# Patient Record
Sex: Female | Born: 1945
Health system: Southern US, Community
[De-identification: ages and names within clinical notes are randomized; demographics above are authoritative.]

## PROBLEM LIST (undated history)

## (undated) DIAGNOSIS — M1990 Unspecified osteoarthritis, unspecified site: Secondary | ICD-10-CM

## (undated) DIAGNOSIS — F419 Anxiety disorder, unspecified: Secondary | ICD-10-CM

## (undated) DIAGNOSIS — F32A Depression, unspecified: Secondary | ICD-10-CM

## (undated) DIAGNOSIS — K219 Gastro-esophageal reflux disease without esophagitis: Secondary | ICD-10-CM

## (undated) DIAGNOSIS — Z923 Personal history of irradiation: Secondary | ICD-10-CM

## (undated) DIAGNOSIS — E785 Hyperlipidemia, unspecified: Secondary | ICD-10-CM

## (undated) DIAGNOSIS — H532 Diplopia: Secondary | ICD-10-CM

## (undated) DIAGNOSIS — I499 Cardiac arrhythmia, unspecified: Secondary | ICD-10-CM

## (undated) DIAGNOSIS — R079 Chest pain, unspecified: Secondary | ICD-10-CM

## (undated) DIAGNOSIS — R0602 Shortness of breath: Secondary | ICD-10-CM

## (undated) DIAGNOSIS — R002 Palpitations: Secondary | ICD-10-CM

## (undated) HISTORY — DX: Shortness of breath: R06.02

## (undated) HISTORY — DX: Chest pain, unspecified: R07.9

## (undated) HISTORY — DX: Depression, unspecified: F32.A

## (undated) HISTORY — DX: Gastro-esophageal reflux disease without esophagitis: K21.9

## (undated) HISTORY — DX: Palpitations: R00.2

## (undated) HISTORY — DX: Diplopia: H53.2

## (undated) HISTORY — DX: Anxiety disorder, unspecified: F41.9

## (undated) HISTORY — PX: ABDOMINAL HYSTERECTOMY: SHX81

## (undated) HISTORY — DX: Unspecified osteoarthritis, unspecified site: M19.90

## (undated) HISTORY — DX: Hyperlipidemia, unspecified: E78.5

## (undated) HISTORY — DX: Cardiac arrhythmia, unspecified: I49.9

---
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
## 1996-09-10 HISTORY — PX: ABDOMINAL HYSTERECTOMY: SHX81

## 1999-12-13 ENCOUNTER — Other Ambulatory Visit: Admission: RE | Admit: 1999-12-13 | Discharge: 1999-12-13 | Payer: Self-pay | Admitting: Radiology

## 2000-02-06 ENCOUNTER — Other Ambulatory Visit: Admission: RE | Admit: 2000-02-06 | Discharge: 2000-02-06 | Payer: Self-pay | Admitting: Internal Medicine

## 2001-02-06 ENCOUNTER — Other Ambulatory Visit: Admission: RE | Admit: 2001-02-06 | Discharge: 2001-02-06 | Payer: Self-pay | Admitting: Internal Medicine

## 2002-09-23 ENCOUNTER — Ambulatory Visit (HOSPITAL_COMMUNITY): Admission: RE | Admit: 2002-09-23 | Discharge: 2002-09-23 | Payer: Self-pay | Admitting: Gastroenterology

## 2008-09-10 HISTORY — PX: WRIST SURGERY: SHX841

## 2012-01-25 ENCOUNTER — Emergency Department (HOSPITAL_COMMUNITY): Payer: Medicare Other

## 2012-01-25 ENCOUNTER — Encounter (HOSPITAL_COMMUNITY): Payer: Self-pay | Admitting: Emergency Medicine

## 2012-01-25 ENCOUNTER — Emergency Department (HOSPITAL_COMMUNITY)
Admission: EM | Admit: 2012-01-25 | Discharge: 2012-01-25 | Disposition: A | Discharge status: Home / Self Care | Payer: Medicare Other | Attending: Emergency Medicine | Admitting: Emergency Medicine

## 2012-01-25 DIAGNOSIS — X58XXXA Exposure to other specified factors, initial encounter: Secondary | ICD-10-CM | POA: Insufficient documentation

## 2012-01-25 DIAGNOSIS — M47812 Spondylosis without myelopathy or radiculopathy, cervical region: Secondary | ICD-10-CM | POA: Insufficient documentation

## 2012-01-25 DIAGNOSIS — S20219A Contusion of unspecified front wall of thorax, initial encounter: Secondary | ICD-10-CM

## 2012-01-25 DIAGNOSIS — S0990XA Unspecified injury of head, initial encounter: Secondary | ICD-10-CM | POA: Insufficient documentation

## 2012-01-25 DIAGNOSIS — R42 Dizziness and giddiness: Secondary | ICD-10-CM | POA: Insufficient documentation

## 2012-01-25 DIAGNOSIS — R55 Syncope and collapse: Secondary | ICD-10-CM

## 2012-01-25 DIAGNOSIS — R51 Headache: Secondary | ICD-10-CM | POA: Insufficient documentation

## 2012-01-25 DIAGNOSIS — S0180XA Unspecified open wound of other part of head, initial encounter: Secondary | ICD-10-CM | POA: Insufficient documentation

## 2012-01-25 DIAGNOSIS — S0181XA Laceration without foreign body of other part of head, initial encounter: Secondary | ICD-10-CM

## 2012-01-25 DIAGNOSIS — R110 Nausea: Secondary | ICD-10-CM | POA: Insufficient documentation

## 2012-01-25 DIAGNOSIS — S0010XA Contusion of unspecified eyelid and periocular area, initial encounter: Secondary | ICD-10-CM | POA: Insufficient documentation

## 2012-01-25 LAB — URINALYSIS, ROUTINE W REFLEX MICROSCOPIC
Bilirubin Urine: NEGATIVE
Glucose, UA: NEGATIVE mg/dL
Hgb urine dipstick: NEGATIVE
Ketones, ur: NEGATIVE mg/dL
Leukocytes, UA: NEGATIVE
Nitrite: NEGATIVE
Protein, ur: NEGATIVE mg/dL
Specific Gravity, Urine: 1.01 (ref 1.005–1.030)
Urobilinogen, UA: 0.2 mg/dL (ref 0.0–1.0)
pH: 7.5 (ref 5.0–8.0)

## 2012-01-25 LAB — CBC
HCT: 39.6 % (ref 36.0–46.0)
Hemoglobin: 13.2 g/dL (ref 12.0–15.0)
MCH: 31.5 pg (ref 26.0–34.0)
MCHC: 33.3 g/dL (ref 30.0–36.0)
MCV: 94.5 fL (ref 78.0–100.0)
Platelets: 259 10*3/uL (ref 150–400)
RBC: 4.19 MIL/uL (ref 3.87–5.11)
RDW: 13.1 % (ref 11.5–15.5)
WBC: 11.8 10*3/uL — ABNORMAL HIGH (ref 4.0–10.5)

## 2012-01-25 LAB — POCT I-STAT TROPONIN I: Troponin i, poc: 0 ng/mL (ref 0.00–0.08)

## 2012-01-25 LAB — COMPREHENSIVE METABOLIC PANEL
ALT: 19 U/L (ref 0–35)
AST: 19 U/L (ref 0–37)
Albumin: 4.1 g/dL (ref 3.5–5.2)
Alkaline Phosphatase: 81 U/L (ref 39–117)
BUN: 15 mg/dL (ref 6–23)
CO2: 25 mEq/L (ref 19–32)
Calcium: 9 mg/dL (ref 8.4–10.5)
Chloride: 106 mEq/L (ref 96–112)
Creatinine, Ser: 0.61 mg/dL (ref 0.50–1.10)
GFR calc Af Amer: >90 mL/min (ref >90)
GFR calc non Af Amer: >90 mL/min (ref >90)
Glucose, Bld: 114 mg/dL — ABNORMAL HIGH (ref 70–99)
Potassium: 3.7 mEq/L (ref 3.5–5.1)
Sodium: 143 mEq/L (ref 135–145)
Total Bilirubin: 0.5 mg/dL (ref 0.3–1.2)
Total Protein: 7.4 g/dL (ref 6.0–8.3)

## 2012-01-25 MED ORDER — ONDANSETRON 8 MG PO TBDP: 8.0000 mg | ORAL_TABLET | Freq: Three times a day (TID) | ORAL | Status: AC | PRN

## 2012-01-25 NOTE — ED Provider Notes (Signed)
History     CSN: [id number]  Arrival date & time 01/25/12  1710   First MD Initiated Contact with Patient 01/25/12 1801      Chief Complaint  Patient presents with  . Head Injury    (Consider location/radiation/quality/duration/timing/severity/associated sxs/prior treatment) Patient is a 66 y.o. female presenting with syncope. The history is provided by the patient.  Loss of Consciousness This is a new problem. The current episode started in the past 7 days. Associated symptoms include headaches, nausea and weakness. Pertinent negatives include no abdominal pain, chest pain, chills, fever or vomiting.  Pt states she is here visiting from [place], states she was doing some yard work outside two days ago. States was walking on rocky ground, and next thing she remembers is being inside the house and going down the steps sitting in a chair with cuts and bleeding from her head. She states she believes she fell because she found her eye glasses  On the ground. States having pain and lacerations to right face. Reports headache, nausea, dizziness since then. States lacerations were cleaned and sterri strips applied. Pt denies any medical problems or similar episodes in the past. She does not remember tripping or feeling dizzy.  No past medical history on file.  Past Surgical History  Procedure Date  . Abdominal hysterectomy     No family history on file.  History  Substance Use Topics  . Smoking status: Never Smoker   . Smokeless tobacco: Not on file  . Alcohol Use: Yes     occasionally    OB History    Grav Para Term Preterm Abortions TAB SAB Ect Mult Living                  Review of Systems  Constitutional: Negative for fever and chills.  HENT: Positive for facial swelling.   Eyes: Negative for visual disturbance.  Respiratory: Negative.  Negative for shortness of breath.   Cardiovascular: Positive for syncope. Negative for chest pain, palpitations and leg swelling.    Gastrointestinal: Positive for nausea. Negative for vomiting and abdominal pain.  Genitourinary: Negative for dysuria.  Musculoskeletal: Negative.   Skin: Positive for wound.  Neurological: Positive for dizziness, syncope, weakness, light-headedness and headaches.    Allergies  Review of patient's allergies indicates no known allergies.  Home Medications   Current Outpatient Rx  Name Route Sig Dispense Refill  . CALCIUM CARBONATE-VITAMIN D 500-200 MG-UNIT PO TABS Oral Take 1 tablet by mouth daily.    . CENTRUM PO Oral Take 1 tablet by mouth daily.    . MULTIVITAMINS PO TABS Oral Take 1 tablet by mouth daily.    . OMEGA-3-ACID ETHYL ESTERS 1 G PO CAPS Oral Take 2 g by mouth daily.    Marland Kitchen PAROXETINE HCL 10 MG PO TABS Oral Take 10 mg by mouth every morning.      BP 146/77  Pulse 77  Temp(Src) 98.7 F (37.1 C) (Oral)  Resp 20  SpO2 99%  Physical Exam  Nursing note and vitals reviewed. Constitutional: She is oriented to person, place, and time. She appears well-developed and well-nourished. No distress.  HENT:  Head: Normocephalic.       Right periorbital hematoma. Small multiple lacerations to the right temporal area and right cheek. Non gaping.   Eyes: Conjunctivae and EOM are normal. Pupils are equal, round, and reactive to light.  Neck: Normal range of motion. Neck supple.  Cardiovascular: Normal rate, regular rhythm and normal heart sounds.  Pulmonary/Chest: Effort normal and breath sounds normal. No respiratory distress. She has no wheezes. She has no rales.  Abdominal: Soft. Bowel sounds are normal. There is no tenderness.  Musculoskeletal: Normal range of motion.  Lymphadenopathy:    She has no cervical adenopathy.  Neurological: She is alert and oriented to person, place, and time.       Grip 5/5 and equal bilat. Normal coordination. Normal finger to nose  Skin: Skin is warm and dry.  Psychiatric: She has a normal mood and affect.    ED Course  Procedures  (including critical care time)  Pt with possible syncopal episode followed by amnesia. Hematoma to right face, headache, dizziness. Will get CT head, face, labs.   Results for orders placed during the hospital encounter of 01/25/12  CBC      Component Value Range   WBC 11.8 (*) 4.0 - 10.5 (K/uL)   RBC 4.19  3.87 - 5.11 (MIL/uL)   Hemoglobin 13.2  12.0 - 15.0 (g/dL)   HCT 78.2  95.6 - 21.3 (%)   MCV 94.5  78.0 - 100.0 (fL)   MCH 31.5  26.0 - 34.0 (pg)   MCHC 33.3  30.0 - 36.0 (g/dL)   RDW 08.6  57.8 - 46.9 (%)   Platelets 259  150 - 400 (K/uL)  COMPREHENSIVE METABOLIC PANEL      Component Value Range   Sodium 143  135 - 145 (mEq/L)   Potassium 3.7  3.5 - 5.1 (mEq/L)   Chloride 106  96 - 112 (mEq/L)   CO2 25  19 - 32 (mEq/L)   Glucose, Bld 114 (*) 70 - 99 (mg/dL)   BUN 15  6 - 23 (mg/dL)   Creatinine, Ser 6.29  0.50 - 1.10 (mg/dL)   Calcium 9.0  8.4 - 52.8 (mg/dL)   Total Protein 7.4  6.0 - 8.3 (g/dL)   Albumin 4.1  3.5 - 5.2 (g/dL)   AST 19  0 - 37 (U/L)   ALT 19  0 - 35 (U/L)   Alkaline Phosphatase 81  39 - 117 (U/L)   Total Bilirubin 0.5  0.3 - 1.2 (mg/dL)   GFR calc non Af Amer >90  >90 (mL/min)   GFR calc Af Amer >90  >90 (mL/min)  URINALYSIS, ROUTINE W REFLEX MICROSCOPIC      Component Value Range   Color, Urine YELLOW  YELLOW    APPearance CLEAR  CLEAR    Specific Gravity, Urine 1.010  1.005 - 1.030    pH 7.5  5.0 - 8.0    Glucose, UA NEGATIVE  NEGATIVE (mg/dL)   Hgb urine dipstick NEGATIVE  NEGATIVE    Bilirubin Urine NEGATIVE  NEGATIVE    Ketones, ur NEGATIVE  NEGATIVE (mg/dL)   Protein, ur NEGATIVE  NEGATIVE (mg/dL)   Urobilinogen, UA 0.2  0.0 - 1.0 (mg/dL)   Nitrite NEGATIVE  NEGATIVE    Leukocytes, UA NEGATIVE  NEGATIVE   POCT I-STAT TROPONIN I      Component Value Range   Troponin i, poc 0.00  0.00 - 0.08 (ng/mL)   Comment 3            Dg Ribs Unilateral W/chest Right  01/25/2012  *RADIOLOGY REPORT*  Clinical Data: Injury today.  Head injury.  Pain  right lower ribs.  RIGHT RIBS AND CHEST - 3+ VIEW  Comparison: None.  Findings: Normal heart size.  Thoracic aorta is mildly tortuous. Hilar contours and pulmonary vascularity are within normal limits. The  lungs are well expanded and clear.  There is no pneumothorax or pleural effusion.  There is a remote healed fracture deformity of the left eighth rib and probable remote healed fracture deformity of the left ninth rib.  No acute or healing right rib fracture is identified.  IMPRESSION:  1.  No evidence of right rib fracture. 2.  No acute cardiopulmonary disease. 3.  Two remote, healed left rib fractures.  Original Report Authenticated By: Britta Mccreedy, M.D.   Ct Head Wo Contrast  01/25/2012  *RADIOLOGY REPORT*  Clinical Data:  Fall 2 days ago, loss of consciousness, periorbital hematoma and right facial lacerations  CT HEAD WITHOUT CONTRAST CT MAXILLOFACIAL WITHOUT CONTRAST CT CERVICAL SPINE WITHOUT CONTRAST  Technique:  Multidetector CT imaging of the head, cervical spine, and maxillofacial structures were performed using the standard protocol without intravenous contrast. Multiplanar CT image reconstructions of the cervical spine and maxillofacial structures were also generated.  Comparison:   None  CT HEAD  Findings: No acute hemorrhage, acute infarction, or mass lesion is identified.  No midline shift.  No ventriculomegaly.  No skull fracture.  Orbits and paranasal sinuses are unremarkable.  IMPRESSION: No acute intracranial finding.  CT MAXILLOFACIAL  Findings:  Mandibular condyles are properly located.  The vomer is midline.  Ostiomeatal units are patent.  Trace bilateral maxillary mucoperiosteal thickening.  Mandibular condyles are properly located. Soft tissue density in the bilateral external auditory canals likely represents cerumen.  No air fluid level.  Mastoid air cells are patent. Streak artifact from dental amalgam noted.  Mild right periorbital soft tissue swelling.  Intraorbital fat is clean.   IMPRESSION: No facial bone fracture identified.  Mild sinusitis.  Mild right facial soft tissue swelling in the periorbital region.  CT CERVICAL SPINE  Findings:   C1 through the cervical thoracic junction is visualized in its entirety. No precervical soft tissue widening is present. Multilevel mild facet osteoarthritic change is present. Uncovertebral joint hypertrophy is noted with left greater than right neural foraminal narrowing at C5-6.  No fracture or dislocation identified.  Visualized lung apices are clear.  Mild right carotid bulb calcification.  IMPRESSION: No acute fracture or dislocation.  Mild C5-6 degenerative change.  Original Report Authenticated By: Harrel Lemon, M.D.   Ct Cervical Spine Wo Contrast  01/25/2012  *RADIOLOGY REPORT*  Clinical Data:  Fall 2 days ago, loss of consciousness, periorbital hematoma and right facial lacerations  CT HEAD WITHOUT CONTRAST CT MAXILLOFACIAL WITHOUT CONTRAST CT CERVICAL SPINE WITHOUT CONTRAST  Technique:  Multidetector CT imaging of the head, cervical spine, and maxillofacial structures were performed using the standard protocol without intravenous contrast. Multiplanar CT image reconstructions of the cervical spine and maxillofacial structures were also generated.  Comparison:   None  CT HEAD  Findings: No acute hemorrhage, acute infarction, or mass lesion is identified.  No midline shift.  No ventriculomegaly.  No skull fracture.  Orbits and paranasal sinuses are unremarkable.  IMPRESSION: No acute intracranial finding.  CT MAXILLOFACIAL  Findings:  Mandibular condyles are properly located.  The vomer is midline.  Ostiomeatal units are patent.  Trace bilateral maxillary mucoperiosteal thickening.  Mandibular condyles are properly located. Soft tissue density in the bilateral external auditory canals likely represents cerumen.  No air fluid level.  Mastoid air cells are patent. Streak artifact from dental amalgam noted.  Mild right periorbital soft  tissue swelling.  Intraorbital fat is clean.  IMPRESSION: No facial bone fracture identified.  Mild sinusitis.  Mild right facial  soft tissue swelling in the periorbital region.  CT CERVICAL SPINE  Findings:   C1 through the cervical thoracic junction is visualized in its entirety. No precervical soft tissue widening is present. Multilevel mild facet osteoarthritic change is present. Uncovertebral joint hypertrophy is noted with left greater than right neural foraminal narrowing at C5-6.  No fracture or dislocation identified.  Visualized lung apices are clear.  Mild right carotid bulb calcification.  IMPRESSION: No acute fracture or dislocation.  Mild C5-6 degenerative change.  Original Report Authenticated By: Harrel Lemon, M.D.   Ct Maxillofacial Wo Cm  01/25/2012  *RADIOLOGY REPORT*  Clinical Data:  Fall 2 days ago, loss of consciousness, periorbital hematoma and right facial lacerations  CT HEAD WITHOUT CONTRAST CT MAXILLOFACIAL WITHOUT CONTRAST CT CERVICAL SPINE WITHOUT CONTRAST  Technique:  Multidetector CT imaging of the head, cervical spine, and maxillofacial structures were performed using the standard protocol without intravenous contrast. Multiplanar CT image reconstructions of the cervical spine and maxillofacial structures were also generated.  Comparison:   None  CT HEAD  Findings: No acute hemorrhage, acute infarction, or mass lesion is identified.  No midline shift.  No ventriculomegaly.  No skull fracture.  Orbits and paranasal sinuses are unremarkable.  IMPRESSION: No acute intracranial finding.  CT MAXILLOFACIAL  Findings:  Mandibular condyles are properly located.  The vomer is midline.  Ostiomeatal units are patent.  Trace bilateral maxillary mucoperiosteal thickening.  Mandibular condyles are properly located. Soft tissue density in the bilateral external auditory canals likely represents cerumen.  No air fluid level.  Mastoid air cells are patent. Streak artifact from dental amalgam  noted.  Mild right periorbital soft tissue swelling.  Intraorbital fat is clean.  IMPRESSION: No facial bone fracture identified.  Mild sinusitis.  Mild right facial soft tissue swelling in the periorbital region.  CT CERVICAL SPINE  Findings:   C1 through the cervical thoracic junction is visualized in its entirety. No precervical soft tissue widening is present. Multilevel mild facet osteoarthritic change is present. Uncovertebral joint hypertrophy is noted with left greater than right neural foraminal narrowing at C5-6.  No fracture or dislocation identified.  Visualized lung apices are clear.  Mild right carotid bulb calcification.  IMPRESSION: No acute fracture or dislocation.  Mild C5-6 degenerative change.  Original Report Authenticated By: Harrel Lemon, M.D.     Date: 01/25/2012  Rate: 71  Rhythm: normal sinus rhythm  QRS Axis: normal  Intervals: normal  ST/T Wave abnormalities: nonspecific T wave changes  Conduction Disutrbances:none  Narrative Interpretation:   Old EKG Reviewed: none available  Negative CT face, head, c spine. Labs unremarkable. Pt monitored. Normal ECG strip.  Pt wanting to go home. Explained that we do not have a reaon for her syncope, and that she will need more testing as soon as possible for further evaluation. Pt sees Dr. Wylene Simmer in [place], instructed to call him if still in town on Monday. She has no medical problems otherwise.   1. Syncope   2. Minor head injury   3. Chest wall contusion   4. Laceration of face       MDM          Lottie Mussel, PA 01/26/12 [phone]

## 2012-01-25 NOTE — Discharge Instructions (Signed)
Your lab work and CT scans did not show any major signs of trauma or abnormalities. Take tylenol for headache and pain. Rest. zofran for nausea. Ice your head and face several times a day. Neosporin to your lacerations twice a day. Keep them clean. You can wash with gentle soap and warm water. Follow up with your doctor as soon as able.   Concussion and Brain Injury A blow or jolt to the head can disrupt the normal function of the brain. This type of brain injury is often called a "concussion" or a "closed head injury." Concussions are usually not life-threatening. Even so, the effects of a concussion can be serious.  CAUSES  A concussion is caused by a blunt blow to the head. The blow might be direct or indirect as described below.  Direct blow (running into another player during a soccer game, being hit in a fight, or hitting your head on a hard surface).   Indirect blow (when your head moves rapidly and violently back and forth like in a car crash).  SYMPTOMS  The brain is very complex. Every head injury is different. Some symptoms may appear right away. Other symptoms may not show up for days or weeks after the concussion. The signs of concussion can be hard to notice. Early on, problems may be missed by patients, family members, and caregivers. You may look fine even though you are acting or feeling differently.  These symptoms are usually temporary, but may last for days, weeks, or even longer. Symptoms include:  Mild headaches that will not go away.   Having more trouble than usual with:   Remembering things.   Paying attention or concentrating.   Organizing daily tasks.   Making decisions and solving problems.   Slowness in thinking, acting, speaking, or reading.   Getting lost or easily confused.   Feeling tired all the time or lacking energy (fatigue).   Feeling drowsy.   Sleep disturbances.   Sleeping more than usual.   Sleeping less than usual.   Trouble falling  asleep.   Trouble sleeping (insomnia).   Loss of balance or feeling lightheaded or dizzy.   Nausea or vomiting.   Numbness or tingling.   Increased sensitivity to:   Sounds.   Lights.   Distractions.  Other symptoms might include:  Vision problems or eyes that tire easily.   Diminished sense of taste or smell.   Ringing in the ears.   Mood changes such as feeling sad, anxious, or listless.   Becoming easily irritated or angry for little or no reason.   Lack of motivation.  DIAGNOSIS  Your caregiver can usually diagnose a concussion or mild brain injury based on your description of your injury and your symptoms.  Your evaluation might include:  A brain scan to look for signs of injury to the brain. Even if the test shows no injury, you may still have a concussion.   Blood tests to be sure other problems are not present.  TREATMENT   People with a concussion need to be examined and evaluated. Most people with concussions are treated in an emergency department, urgent care, or clinic. Some people must stay in the hospital overnight for further treatment.   Your caregiver will send you home with important instructions to follow. Be sure to carefully follow them.   Tell your caregiver if you are already taking any medicines (prescription, over-the-counter, or natural remedies), or if you are drinking alcohol or taking illegal drugs.  Also, talk with your caregiver if you are taking blood thinners (anticoagulants) or aspirin. These drugs may increase your chances of complications. All of this is important information that may affect treatment.   Only take over-the-counter or prescription medicines for pain, discomfort, or fever as directed by your caregiver.  PROGNOSIS  How fast people recover from brain injury varies from person to person. Although most people have a good recovery, how quickly they improve depends on many factors. These factors include how severe their  concussion was, what part of the brain was injured, their age, and how healthy they were before the concussion.  Because all head injuries are different, so is recovery. Most people with mild injuries recover fully. Recovery can take time. In general, recovery is slower in older persons. Also, persons who have had a concussion in the past or have other medical problems may find that it takes longer to recover from their current injury. Anxiety and depression may also make it harder to adjust to the symptoms of brain injury. HOME CARE INSTRUCTIONS  Return to your normal activities slowly, not all at once. You must give your body and brain enough time for recovery.  Get plenty of sleep at night, and rest during the day. Rest helps the brain to heal.   Avoid staying up late at night.   Keep the same bedtime hours on weekends and weekdays.   Take daytime naps or rest breaks when you feel tired.   Limit activities that require a lot of thought or concentration (brain or cognitive rest). This includes:   Homework or job-related work.   Watching TV.   Computer work.   Avoid activities that could lead to a second brain injury, such as contact or recreational sports, until your caregiver says it is okay. Even after your brain injury has healed, you should protect yourself from having another concussion.   Ask your caregiver when you can return to your normal activities such as driving, bicycling, or operating heavy equipment. Your ability to react may be slower after a brain injury.   Talk with your caregiver about when you can return to work or school.   Inform your teachers, school nurse, school counselor, coach, Event organiser, or work Production designer, theatre/television/film about your injury, symptoms, and restrictions. They should be instructed to report:   Increased problems with attention or concentration.   Increased problems remembering or learning new information.   Increased time needed to complete tasks or  assignments.   Increased irritability or decreased ability to cope with stress.   Increased symptoms.   Take only those medicines that your caregiver has approved.   Do not drink alcohol until your caregiver says you are well enough to do so. Alcohol and certain other drugs may slow your recovery and can put you at risk of further injury.   If it is harder than usual to remember things, write them down.   If you are easily distracted, try to do one thing at a time. For example, do not try to watch TV while fixing dinner.   Talk with family members or close friends when making important decisions.   Keep all follow-up appointments. Repeated evaluation of your symptoms is recommended for your recovery.  PREVENTION  Protect your head from future injury. It is very important to avoid another head or brain injury before you have recovered. In rare cases, another injury has lead to permanent brain damage, brain swelling, or death. Avoid injuries by using:  Seatbelts when riding in a car.   Alcohol only in moderation.   A helmet when biking, skiing, skateboarding, skating, or doing similar activities.   Safety measures in your home.   Remove clutter and tripping hazards from floors and stairways.   Use grab bars in bathrooms and handrails by stairs.   Place non-slip mats on floors and in bathtubs.   Improve lighting in dim areas.  SEEK MEDICAL CARE IF:  A head injury can cause lingering symptoms. You should seek medical care if you have any of the following symptoms for more than 3 weeks after your injury or are planning to return to sports:  Chronic headaches.   Dizziness or balance problems.   Nausea.   Vision problems.   Increased sensitivity to noise or light.   Depression or mood swings.   Anxiety or irritability.   Memory problems.   Difficulty concentrating or paying attention.   Sleep problems.   Feeling tired all the time.  SEEK IMMEDIATE MEDICAL CARE IF:    You have had a blow or jolt to the head and you (or your family or friends) notice:  Severe or worsening headaches.   Weakness (even if only in one hand or one leg or one part of the face), numbness, or decreased coordination.   Repeated vomiting.   Increased sleepiness or passing out.   One black center of the eye (pupil) is larger than the other.   Convulsions (seizures).   Slurred speech.   Increasing confusion, restlessness, agitation, or irritability.   Lack of ability to recognize people or places.   Neck pain.   Difficulty being awakened.   Unusual behavior changes.   Loss of consciousness.  Older adults with a brain injury may have a higher risk of serious complications such as a blood clot on the brain. Headaches that get worse or an increase in confusion are signs of this complication. If these signs occur, see a caregiver right away. MAKE SURE YOU:   Understand these instructions.   Will watch your condition.   Will get help right away if you are not doing well or get worse.  FOR MORE INFORMATION  Several groups help people with brain injury and their families. They provide information and put people in touch with local resources. These include support groups, rehabilitation services, and a variety of health care professionals. Among these groups, the Brain Injury Association (BIA, www.biausa.org) has a Secretary/administrator that gathers scientific and educational information and works on a national level to help people with brain injury.  Document Released: 11/17/2003 Document Revised: 08/16/2011 Document Reviewed: 04/14/2008 [facility] Patient Information 2012 [place], [place].

## 2012-01-25 NOTE — ED Notes (Signed)
Pt alert, nad, c/o fall injury, occurred on Wednesday, pt has large ecchymotic area to right orbit, Pupils 3 and reactive, PMS intact, cont to monitor

## 2012-01-25 NOTE — ED Notes (Signed)
Off floor for testing 

## 2012-01-25 NOTE — ED Notes (Signed)
[id number]> Expected date:<BR> Expected time:<BR> Means of arrival:<BR> Comments:<BR> Hold for redmon-FT 7.

## 2012-01-25 NOTE — ED Notes (Signed)
Pt also reports pain in teeth on right side, pain in right shoulder, pain in right ribs and right hip

## 2012-01-25 NOTE — ED Notes (Signed)
Pt fell 2 days ago while in the back yard. Probably struck her head on rocks. Reports having apositive LOC. Pt has severe periorbital hematoma, multiple bandaids on right side of face. Pt states her sister cleaned the wounds and closed them with steri striips. Today pt reports having dizziness and nausea

## 2012-01-28 NOTE — ED Provider Notes (Signed)
Medical screening examination/treatment/procedure(s) were conducted as a shared visit with non-physician practitioner(s) and myself.  I personally evaluated the patient during the encounter S/p fall in yard a couple days ago, states feels was mechanical fall as tried to break fall with bil hands/wrists. Spine nt. A/o x 3. Motor intact. Steady gait. Monitor. Labs.   Suzi Roots, MD 01/28/12 1806

## 2019-10-11 ENCOUNTER — Ambulatory Visit: Payer: Self-pay

## 2019-10-17 ENCOUNTER — Ambulatory Visit: Payer: Medicare PPO

## 2019-10-22 ENCOUNTER — Ambulatory Visit: Payer: Self-pay

## 2019-11-06 ENCOUNTER — Ambulatory Visit: Payer: Medicare PPO | Attending: Internal Medicine

## 2019-11-06 DIAGNOSIS — Z23 Encounter for immunization: Secondary | ICD-10-CM | POA: Insufficient documentation

## 2019-11-06 NOTE — Progress Notes (Signed)
   Covid-19 Vaccination Clinic  Name:  XIARA BRADT    MRN: [id number] DOB: [birth year]  11/06/2019  Ms. Broaden was observed post Covid-19 immunization for 15 minutes without incidence. She was provided with Vaccine Information Sheet and instruction to access the V-Safe system.   Ms. Moo was instructed to call 911 with any severe reactions post vaccine: Marland Kitchen Difficulty breathing  . Swelling of your face and throat  . A fast heartbeat  . A bad rash all over your body  . Dizziness and weakness    Immunizations Administered    Name Date Dose VIS Date Route   Pfizer COVID-19 Vaccine 11/06/2019  9:19 AM 0.3 mL 08/21/2019 Intramuscular   Manufacturer: [place]   Lot: [id number]   [place]: [id number]

## 2019-12-01 ENCOUNTER — Ambulatory Visit: Payer: Medicare PPO | Attending: Internal Medicine

## 2019-12-01 DIAGNOSIS — Z23 Encounter for immunization: Secondary | ICD-10-CM

## 2019-12-01 NOTE — Progress Notes (Signed)
   Covid-19 Vaccination Clinic  Name:  SHARLEEN LULE    MRN: [id number] DOB: 07-17-1946  12/01/2019  Ms. Hottel was observed post Covid-19 immunization for 15 minutes without incident. She was provided with Vaccine Information Sheet and instruction to access the V-Safe system.   Ms. Mahi was instructed to call 911 with any severe reactions post vaccine: Marland Kitchen Difficulty breathing  . Swelling of face and throat  . A fast heartbeat  . A bad rash all over body  . Dizziness and weakness   Immunizations Administered    Name Date Dose VIS Date Route   Pfizer COVID-19 Vaccine 12/01/2019  4:05 PM 0.3 mL 08/21/2019 Intramuscular   Manufacturer: [place]   Lot: [id number]   [place]: [id number]

## 2020-01-07 DIAGNOSIS — L400 Psoriasis vulgaris: Secondary | ICD-10-CM | POA: Diagnosis not present

## 2020-02-18 ENCOUNTER — Encounter: Payer: Self-pay | Admitting: *Deleted

## 2020-02-19 ENCOUNTER — Other Ambulatory Visit: Payer: Self-pay

## 2020-02-19 ENCOUNTER — Ambulatory Visit: Payer: Medicare PPO | Admitting: Neurology

## 2020-02-19 ENCOUNTER — Encounter: Payer: Self-pay | Admitting: Neurology

## 2020-02-19 VITALS — BP 136/91 | HR 78 | Ht 67.0 in | Wt 163.0 lb

## 2020-02-19 DIAGNOSIS — M62838 Other muscle spasm: Secondary | ICD-10-CM | POA: Diagnosis not present

## 2020-02-19 MED ORDER — GABAPENTIN 100 MG PO CAPS: 100.0000 mg | ORAL_CAPSULE | Freq: Every day | ORAL | 1 refills | Status: DC

## 2020-02-19 NOTE — Progress Notes (Signed)
Provider:  Larey Seat, M D  Referring Provider: Rolm Bookbinder, MD Primary Care Physician:  Burnard Bunting , MD   Chief Complaint  Patient presents with  . Neuropathic Itch    rm 11 New Pt "itch in my neck along hairline and up over L ear, at times along jaw; always pain in neck and left shoulder"    HPI:  Deborah Dougherty is a 74 y.o. female patient , right handed, caucasian,  and seen on 02-19-2020 upon a referral from Dr. Ubaldo Glassing for a facial sensory abnormality.  The patient was referred by her dermatologist and not PCP today, she is a talkative, pleasant person and described the onset of the symptoms to be evaluated here today at 18 month ago, before [place] struck. She tried creams and ointments, no success.  She describes that she had an itching sensation without visible rash or blisters under the left jawline.  The year over months following the initial manifestation the area of itching extended towards the left ear and along the hairline. Dr. Ubaldo Glassing did not note any kind of rash.  There are Dr. Ubaldo Glassing now localized the itching to the left mid medial occipital scalp and she stated that she found a well-defined erythematous scaly plaque in that area.  The patient was referred here after she discussed gabapentin medication. She reported a creepy crawly sensation under the unblemished skin.    Deborah Dougherty reports that she has been living alone since her husband passed in 2018 and also she has a bachelor's degree she has not been an active member of the workforce.  She describes herself as being inexperienced in many years of her having her husband handling financial affairs, driving her, making sure that bills are paid -and making all decisions. Before she married at age 45 and just out of college, until then her mother had regulated her life. Never smoker, never ETOH abuse, no caffeine overuse.   Review of Systems: Out of a complete 14 system review, the patient complains of only  the following symptoms, and all other reviewed systems are negative. An itch in my neckline-radiating  along hairline and up over L ear, at times along jaw; always pain in neck and left shoulder"  No problems with dexterity, balance, change of vision, penmanship, loss of taste or loss of smell.  Social History   Socioeconomic History  . Marital status: Widowed    Spouse name: Not on file  . Number of children: 1  . Years of education: Not on file  . Highest education level: Bachelor's degree (e.g., BA, AB, BS)  Occupational History  . Not on file  Tobacco Use  . Smoking status: Never Smoker  . Smokeless tobacco: Never Used  . Tobacco comment: quit 1969  Substance and Sexual Activity  . Alcohol use: Yes    Comment: occasionally, 6 oz daily  . Drug use: Never  . Sexual activity: Not on file  Other Topics Concern  . Not on file  Social History Narrative   Lives alone   Caffeine 10 oz daily   Social Determinants of Health   Financial Resource Strain:   . Difficulty of Paying Living Expenses:   Food Insecurity:   . Worried About Charity fundraiser in the Last Year:   . Arboriculturist in the Last Year:   Transportation Needs:   . Film/video editor (Medical):   Marland Kitchen Lack of Transportation (Non-Medical):   Physical  Activity:   . Days of Exercise per Week:   . Minutes of Exercise per Session:   Stress:   . Feeling of Stress :   Social Connections:   . Frequency of Communication with Friends and Family:   . Frequency of Social Gatherings with Friends and Family:   . Attends Religious Services:   . Active Member of Clubs or Organizations:   . Attends Archivist Meetings:   Marland Kitchen Marital Status:   Intimate Partner Violence:   . Fear of Current or Ex-Partner:   . Emotionally Abused:   Marland Kitchen Physically Abused:   . Sexually Abused:     Family History  Problem Relation Age of Onset  . Cancer Mother   . Cancer Father     Past Medical History:  Diagnosis Date  .  Anxiety   . Depression     Current Outpatient Medications  Medication Sig Dispense Refill  . Cholecalciferol (VITAMIN D3) 50 MCG (2000 UT) TABS Take by mouth daily.    Marland Kitchen ibuprofen (ADVIL) 600 MG tablet Take 600 mg by mouth daily.    . Multiple Vitamins-Minerals (CENTRUM PO) Take 1 tablet by mouth daily.    Marland Kitchen PARoxetine (PAXIL) 20 MG tablet Take 20 mg by mouth daily.    . vitamin C (ASCORBIC ACID) 250 MG tablet Take 250 mg by mouth daily.     No current facility-administered medications for this visit.    Allergies as of 02/19/2020 - Review Complete 02/18/2020  Allergen Reaction Noted  . Cefuroxime axetil Nausea And Vomiting 11/12/2016    Vitals: BP (!) 136/91   Pulse 78   Ht 5\' 7"  (1.702 m)   Wt 163 lb (73.9 kg)   BMI 25.53 kg/m  Last Weight:  Wt Readings from Last 1 Encounters:  02/19/20 163 lb (73.9 kg)   Last Height:   Ht Readings from Last 1 Encounters:  02/19/20 5\' 7"  (1.702 m)    Physical exam:  General: The patient is awake, alert and appears not in acute distress. The patient is well groomed. Head: Normocephalic, atraumatic. Neck is supple.  Mallampati 1, midline  neck circumference: 13.5 Cardiovascular:  Regular rate and rhythm , without  murmurs or carotid bruit, and without distended neck veins. Respiratory: Lungs are clear to auscultation. Skin:  Without evidence of edema, or rash Trunk: BMI is 25.53 - patient  has a normal posture.  Neurologic exam : The patient is awake and alert, oriented to place and time.  Memory subjective described as intact. There is a reduced attention span & concentration ability. Speech is fluent, almost pressured,  without dysarthria, dysphonia or aphasia.  Mood and affect are slightly anxious.   Cranial nerves: Pupils are equal and briskly reactive to light. Funduscopic exam without  evidence of pallor or edema.  Extraocular movements  in vertical and horizontal planes intact and without nystagmus.  Visual fields by finger  perimetry are intact. Hearing to finger rub intact.   Facial sensation intact to fine touch and there is no trigger point for pain. . Facial motor strength is symmetric and tongue and uvula move midline. Tongue protrusion into either cheek is normal. Shoulder shrug is normal.    Indicated the left occipital nerve area as non tender, but paraspinal left are C 3-4 .   Motor exam:   Normal tone ,muscle bulk and symmetric  strength in all extremities.  Sensory:  Fine touch, pinprick and vibration were tested in all extremities. Proprioception was normal.  Coordination: Rapid alternating movements in the fingers/hands were normal.  Finger-to-nose maneuver  normal without evidence of ataxia, dysmetria or tremor.  Gait and station: Patient walks without assistive device and is able unassisted to climb up to the exam table. Strength within normal limits. Stance is stable and normal. Tandem gait is unfragmented.   Deep tendon reflexes: in the  upper and lower extremities are symmetric and intact. downgoing.   Assessment:  After physical and neurologic examination, review of laboratory studies, imaging, neurophysiology testing and pre-existing records, assessment is that of :   I am not sure that I can relate the dysesthesia of frequent falling or tingling sensations and itching to 1 nerve origin.  The patient has also mentions that she has tension in the left shoulder and paraspinal area but that would not originate at the same point.  It is also not disabling or impairing but she is constantly aware of it.    She is obviously worried about this and if it could mean any kind of indication of her underlying disorder or disease,  Plan:  Treatment plan and additional workup : Gabapentin 100 mg at night time.  MRI cervical spine.   Recommended massage for neck tension.  I will invite the patient to return for a trigger point injection if she chooses. Left sided  C3-4    Larey Seat  MD 02/19/2020

## 2020-02-19 NOTE — Patient Instructions (Addendum)
Gabapentin capsules or tablets 100 mg  What is this medicine? GABAPENTIN (GA ba pen tin) is used to control seizures in certain types of epilepsy. It is also used to treat certain types of nerve pain. This medicine may be used for other purposes; ask your health care provider or pharmacist if you have questions. COMMON BRAND NAME(S): Active-PAC with Gabapentin, Gabarone, Neurontin What should I tell my health care provider before I take this medicine? They need to know if you have any of these conditions:  history of drug abuse or alcohol abuse problem  kidney disease  lung or breathing disease  suicidal thoughts, plans, or attempt; a previous suicide attempt by you or a family member  an unusual or allergic reaction to gabapentin, other medicines, foods, dyes, or preservatives  pregnant or trying to get pregnant  breast-feeding How should I use this medicine? Take this medicine by mouth with a glass of water. Follow the directions on the prescription label. You can take it with or without food. If it upsets your stomach, take it with food. Take your medicine at regular intervals. Do not take it more often than directed. Do not stop taking except on your doctor's advice. If you are directed to break the 600 or 800 mg tablets in half as part of your dose, the extra half tablet should be used for the next dose. If you have not used the extra half tablet within 28 days, it should be thrown away. A special MedGuide will be given to you by the pharmacist with each prescription and refill. Be sure to read this information carefully each time. Talk to your pediatrician regarding the use of this medicine in children. While this drug may be prescribed for children as young as 3 years for selected conditions, precautions do apply. Overdosage: If you think you have taken too much of this medicine contact a poison control center or emergency room at once. NOTE: This medicine is only for you. Do  not share this medicine with others. What if I miss a dose? If you miss a dose, take it as soon as you can. If it is almost time for your next dose, take only that dose. Do not take double or extra doses. What may interact with this medicine? This medicine may interact with the following medications:  alcohol  antihistamines for allergy, cough, and cold  certain medicines for anxiety or sleep  certain medicines for depression like amitriptyline, fluoxetine, sertraline  certain medicines for seizures like phenobarbital, primidone  certain medicines for stomach problems  general anesthetics like halothane, isoflurane, methoxyflurane, propofol  local anesthetics like lidocaine, pramoxine, tetracaine  medicines that relax muscles for surgery  narcotic medicines for pain  phenothiazines like chlorpromazine, mesoridazine, prochlorperazine, thioridazine This list may not describe all possible interactions. Give your health care provider a list of all the medicines, herbs, non-prescription drugs, or dietary supplements you use. Also tell them if you smoke, drink alcohol, or use illegal drugs. Some items may interact with your medicine. What should I watch for while using this medicine? Visit your doctor or health care provider for regular checks on your progress. You may want to keep a record at home of how you feel your condition is responding to treatment. You may want to share this information with your doctor or health care provider at each visit. You should contact your doctor or health care provider if your seizures get worse or if you have any new types of seizures.  Do not stop taking this medicine or any of your seizure medicines unless instructed by your doctor or health care provider. Stopping your medicine suddenly can increase your seizures or their severity. This medicine may cause serious skin reactions. They can happen weeks to months after starting the medicine. Contact your  health care provider right away if you notice fevers or flu-like symptoms with a rash. The rash may be red or purple and then turn into blisters or peeling of the skin. Or, you might notice a red rash with swelling of the face, lips or lymph nodes in your neck or under your arms. Wear a medical identification bracelet or chain if you are taking this medicine for seizures, and carry a card that lists all your medications. You may get drowsy, dizzy, or have blurred vision. Do not drive, use machinery, or do anything that needs mental alertness until you know how this medicine affects you. To reduce dizzy or fainting spells, do not sit or stand up quickly, especially if you are an older patient. Alcohol can increase drowsiness and dizziness. Avoid alcoholic drinks. Your mouth may get dry. Chewing sugarless gum or sucking hard candy, and drinking plenty of water will help. The use of this medicine may increase the chance of suicidal thoughts or actions. Pay special attention to how you are responding while on this medicine. Any worsening of mood, or thoughts of suicide or dying should be reported to your health care provider right away. Women who become pregnant while using this medicine may enroll in the [place] Pregnancy Registry by calling [phone]. This registry collects information about the safety of antiepileptic drug use during pregnancy. What side effects may I notice from receiving this medicine? Side effects that you should report to your doctor or health care professional as soon as possible:  allergic reactions like skin rash, itching or hives, swelling of the face, lips, or tongue  breathing problems  rash, fever, and swollen lymph nodes  redness, blistering, peeling or loosening of the skin, including inside the mouth  suicidal thoughts, mood changes Side effects that usually do not require medical attention (report to your doctor or health care  professional if they continue or are bothersome):  dizziness  drowsiness  headache  nausea, vomiting  swelling of ankles, feet, hands  tiredness This list may not describe all possible side effects. Call your doctor for medical advice about side effects. You may report side effects to FDA at 1-800-FDA-1088. Where should I keep my medicine? Keep out of reach of children. This medicine may cause accidental overdose and death if it taken by other adults, children, or pets. Mix any unused medicine with a substance like cat litter or coffee grounds. Then throw the medicine away in a sealed container like a sealed bag or a coffee can with a lid. Do not use the medicine after the expiration date. Store at room temperature between 15 and 30 degrees C (59 and 86 degrees F). NOTE: This sheet is a summary. It may not cover all possible information. If you have questions about this medicine, talk to your doctor, pharmacist, or health care provider.  2020 Elsevier/Gold Standard (2018-11-28 14:16:43) Paresthesia Paresthesia is an abnormal burning or prickling sensation. It is usually felt in the hands, arms, legs, or feet. However, it may occur in any part of the body. Usually, paresthesia is not painful. It may feel like:  Tingling or numbness.  Buzzing.  Itching. Paresthesia may occur without any  clear cause, or it may be caused by:  Breathing too quickly (hyperventilation).  Pressure on a nerve.  An underlying medical condition.  Side effects of a medication.  Nutritional deficiencies.  Exposure to toxic chemicals. Most people experience temporary (transient) paresthesia at some time in their lives. For some people, it may be long-lasting (chronic) because of an underlying medical condition. If you have paresthesia that lasts a long time, you may need to be evaluated by your health care provider. Follow these instructions at home: Alcohol use   Do not drink alcohol if: ? Your  health care provider tells you not to drink. ? You are pregnant, may be pregnant, or are planning to become pregnant.  If you drink alcohol: ? Limit how much you use to:  0-1 drink a day for women.  0-2 drinks a day for men. ? Be aware of how much alcohol is in your drink. In the [place] one drink equals one 12 oz bottle of beer (355 mL), one 5 oz glass of wine (148 mL), or one 1 oz glass of hard liquor (44 mL). Nutrition   Eat a healthy diet. This includes: ? Eating foods that are high in fiber, such as fresh fruits and vegetables, whole grains, and beans. ? Limiting foods that are high in fat and processed sugars, such as fried or sweet foods. General instructions  Take over-the-counter and prescription medicines only as told by your health care provider.  Do not use any products that contain nicotine or tobacco, such as cigarettes and e-cigarettes. These can keep blood from reaching damaged nerves. If you need help quitting, ask your health care provider.  If you have diabetes, work closely with your health care provider to keep your blood sugar under control.  If you have numbness in your feet: ? Check every day for signs of injury or infection. Watch for redness, warmth, and swelling. ? Wear padded socks and comfortable shoes. These help protect your feet.  Keep all follow-up visits as told by your health care provider. This is important. Contact a health care provider if you:  Have paresthesia that gets worse or does not go away.  Have a burning or prickling feeling that gets worse when you walk.  Have pain, cramps, or dizziness.  Develop a rash. Get help right away if you:  Feel weak.  Have trouble walking or moving.  Have problems with speech, understanding, or vision.  Feel confused.  Cannot control your bladder or bowel movements.  Have numbness after an injury.  Develop new weakness in an arm or leg.  Faint. Summary  Paresthesia is an abnormal  burning or prickling sensation that is usually felt in the hands, arms, legs, or feet. It may also occur in other parts of the body.  Paresthesia may occur without any clear cause, or it may be caused by breathing too quickly (hyperventilation), pressure on a nerve, an underlying medical condition, side effects of a medication, nutritional deficiencies, or exposure to toxic chemicals.  If you have paresthesia that lasts a long time, you may need to be evaluated by your health care provider. This information is not intended to replace advice given to you by your health care provider. Make sure you discuss any questions you have with your health care provider. Document Revised: 09/22/2018 Document Reviewed: 09/05/2017 Elsevier Patient Education  [place]. Pruritus Pruritus is an itchy feeling on the skin. One of the most common causes is dry skin, but many different  things can cause itching. Most cases of itching do not require medical attention. Sometimes itchy skin can turn into a rash. Follow these instructions at home: Skin care   Apply moisturizing lotion to your skin as needed. Lotion that contains petroleum jelly is best.  Take medicines or apply medicated creams only as told by your health care provider. This may include: ? Corticosteroid cream. ? Anti-itch lotions. ? Oral antihistamines.  Apply a cool, wet cloth (cool compress) to the affected areas.  Take baths with one of the following: ? Epsom salts. You can get these at your local pharmacy or grocery store. Follow the instructions on the packaging. ? Baking soda. Pour a small amount into the bath as told by your health care provider. ? Colloidal oatmeal. You can get this at your local pharmacy or grocery store. Follow the instructions on the packaging.  Apply baking soda paste to your skin. To make the paste, stir water into a small amount of baking soda until it reaches a paste-like consistency.  Do not scratch  your skin.  Do not take hot showers or baths, which can make itching worse. A cool shower may help with itching as long as you apply moisturizing lotion after the shower.  Do not use scented soaps, detergents, perfumes, and cosmetic products. Instead, use gentle, unscented versions of these items. General instructions  Avoid wearing tight clothes.  Keep a journal to help find out what is causing your itching. Write down: ? What you eat and drink. ? What cosmetic products you use. ? What soaps or detergents you use. ? What you wear, including jewelry.  Use a humidifier. This keeps the air moist, which helps to prevent dry skin.  Be aware of any changes in your itchiness. Contact a health care provider if:  The itching does not go away after several days.  You are unusually thirsty or urinating more than normal.  Your skin tingles or feels numb.  Your skin or the white parts of your eyes turn yellow (jaundice).  You feel weak.  You have any of the following: ? Night sweats. ? Tiredness (fatigue). ? Weight loss. ? Abdominal pain. Summary  Pruritus is an itchy feeling on the skin. One of the most common causes is dry skin, but many different conditions and factors can cause itching.  Apply moisturizing lotion to your skin as needed. Lotion that contains petroleum jelly is best.  Take medicines or apply medicated creams only as told by your health care provider.  Do not take hot showers or baths. Do not use scented soaps, detergents, perfumes, or cosmetic products. This information is not intended to replace advice given to you by your health care provider. Make sure you discuss any questions you have with your health care provider. Document Revised: 09/10/2017 Document Reviewed: 09/10/2017 Elsevier Patient Education  2020 [employer].

## 2020-02-19 NOTE — Addendum Note (Signed)
Addended by: Larey Seat on: 02/19/2020 11:18 AM   Modules accepted: Orders

## 2020-02-24 ENCOUNTER — Telehealth: Payer: Self-pay | Admitting: Neurology

## 2020-02-24 NOTE — Telephone Encounter (Signed)
[employer] pending uploaded notes  

## 2020-02-25 NOTE — Telephone Encounter (Signed)
Mcarthur Rossetti Josem Kaufmann: [id number] (exp. 02/24/20 to 03/25/20) order sent to GI . They will reach out to the patient to schedule.

## 2020-03-12 ENCOUNTER — Ambulatory Visit
Admission: RE | Admit: 2020-03-12 | Discharge: 2020-03-12 | Disposition: A | Discharge status: Home / Self Care | Payer: Medicare PPO | Source: Ambulatory Visit | Attending: Neurology | Admitting: Neurology

## 2020-03-12 DIAGNOSIS — M62838 Other muscle spasm: Secondary | ICD-10-CM | POA: Diagnosis not present

## 2020-03-14 NOTE — Progress Notes (Signed)
At C3-C4, bilateral disc osteophyte complexes causes moderately severe bilateral foraminal narrowing that could lead to C4 nerve root compression to either side.  There is no spinal stenosis at this level. 2.    At C4-C5, severe left facet hypertrophy and minimal left uncovertebral spurring combining to cause moderate left foraminal narrowing but no nerve root compression or spinal stenosis. 3.    At C5-C6, degenerative changes causes moderate left foraminal narrowing but no nerve root compression or spinal stenosis.  There is also borderline 1 mm anterolisthesis. 4.    At C6-C7, there are degenerative changes but no nerve root compression or spinal stenosis. 5.    At C7-T1, there is borderline anterolisthesis and facet hypertrophy but no nerve root compression or spinal stenosis. 5.    T2 hyperintense foci are noted within the pons.  Fazit- here is clearly evidence of bone/ spine degeneration, but not of nerve root compression or spinal stenosis. No surgical intervention needed. Consider chiropractic or physical therapy.

## 2020-03-15 ENCOUNTER — Telehealth: Payer: Self-pay | Admitting: Neurology

## 2020-03-15 DIAGNOSIS — M62838 Other muscle spasm: Secondary | ICD-10-CM

## 2020-03-15 NOTE — Telephone Encounter (Signed)
-----   Message from Larey Seat, MD sent at 03/14/2020 12:25 PM EDT ----- At C3-C4, bilateral disc osteophyte complexes causes moderately severe bilateral foraminal narrowing that could lead to C4 nerve root compression to either side.  There is no spinal stenosis at this level. 2.    At C4-C5, severe left facet hypertrophy and minimal left uncovertebral spurring combining to cause moderate left foraminal narrowing but no nerve root compression or spinal stenosis. 3.    At C5-C6, degenerative changes causes moderate left foraminal narrowing but no nerve root compression or spinal stenosis.  There is also borderline 1 mm anterolisthesis. 4.    At C6-C7, there are degenerative changes but no nerve root compression or spinal stenosis. 5.    At C7-T1, there is borderline anterolisthesis and facet hypertrophy but no nerve root compression or spinal stenosis. 5.    T2 hyperintense foci are noted within the pons.  Fazit- here is clearly evidence of bone/ spine degeneration, but not of nerve root compression or spinal stenosis. No surgical intervention needed. Consider chiropractic or physical therapy.

## 2020-03-15 NOTE — Telephone Encounter (Signed)
Called the patient to review the MRI cervical spine images. Discussed with the patient the findings and what Dr Brett Fairy would recommend. Advised that if she is interested we can refer to physical therapy to assist with the discomfort she is having in her neck area. Pt verbalized understanding and would like to proceed with that option. Referral to PT placed for the patient.

## 2020-05-20 DIAGNOSIS — S52521A Torus fracture of lower end of right radius, initial encounter for closed fracture: Secondary | ICD-10-CM | POA: Diagnosis not present

## 2020-05-20 DIAGNOSIS — M25531 Pain in right wrist: Secondary | ICD-10-CM | POA: Diagnosis not present

## 2020-05-24 DIAGNOSIS — S52521A Torus fracture of lower end of right radius, initial encounter for closed fracture: Secondary | ICD-10-CM | POA: Diagnosis not present

## 2020-06-08 DIAGNOSIS — Z1152 Encounter for screening for COVID-19: Secondary | ICD-10-CM | POA: Diagnosis not present

## 2020-06-08 DIAGNOSIS — H6123 Impacted cerumen, bilateral: Secondary | ICD-10-CM | POA: Diagnosis not present

## 2020-06-08 DIAGNOSIS — R05 Cough: Secondary | ICD-10-CM | POA: Diagnosis not present

## 2020-06-08 DIAGNOSIS — J019 Acute sinusitis, unspecified: Secondary | ICD-10-CM | POA: Diagnosis not present

## 2020-06-21 DIAGNOSIS — S52521A Torus fracture of lower end of right radius, initial encounter for closed fracture: Secondary | ICD-10-CM | POA: Diagnosis not present

## 2020-06-22 ENCOUNTER — Encounter (INDEPENDENT_AMBULATORY_CARE_PROVIDER_SITE_OTHER): Payer: Self-pay | Admitting: Otolaryngology

## 2020-06-22 ENCOUNTER — Ambulatory Visit (INDEPENDENT_AMBULATORY_CARE_PROVIDER_SITE_OTHER): Payer: Medicare PPO | Admitting: Otolaryngology

## 2020-06-22 ENCOUNTER — Other Ambulatory Visit: Payer: Self-pay

## 2020-06-22 VITALS — Temp 97.5°F

## 2020-06-22 DIAGNOSIS — H6123 Impacted cerumen, bilateral: Secondary | ICD-10-CM | POA: Diagnosis not present

## 2020-06-22 NOTE — Progress Notes (Signed)
HPI: Deborah Dougherty is a 74 y.o. female who presents is referred by [facility] for evaluation of bilateral cerumen impactions.  Patient has used a paperclip in the right ear to try to clean it.  She has blockage of her hearing worse on the right side..  Past Medical History:  Diagnosis Date  . Anxiety   . Depression    Past Surgical History:  Procedure Laterality Date  . ABDOMINAL HYSTERECTOMY  1998  . TONSILLECTOMY  1955  . WRIST SURGERY  2010   Social History   Socioeconomic History  . Marital status: Widowed    Spouse name: Not on file  . Number of children: 1  . Years of education: Not on file  . Highest education level: Bachelor's degree (e.g., BA, AB, BS)  Occupational History  . Not on file  Tobacco Use  . Smoking status: Never Smoker  . Smokeless tobacco: Never Used  . Tobacco comment: quit 1969  Substance and Sexual Activity  . Alcohol use: Yes    Comment: occasionally, 6 oz daily  . Drug use: Never  . Sexual activity: Not on file  Other Topics Concern  . Not on file  Social History Narrative   Lives alone   Caffeine 10 oz daily   Social Determinants of Health   Financial Resource Strain:   . Difficulty of Paying Living Expenses: Not on file  Food Insecurity:   . Worried About Charity fundraiser in the Last Year: Not on file  . Ran Out of Food in the Last Year: Not on file  Transportation Needs:   . Lack of Transportation (Medical): Not on file  . Lack of Transportation (Non-Medical): Not on file  Physical Activity:   . Days of Exercise per Week: Not on file  . Minutes of Exercise per Session: Not on file  Stress:   . Feeling of Stress : Not on file  Social Connections:   . Frequency of Communication with Friends and Family: Not on file  . Frequency of Social Gatherings with Friends and Family: Not on file  . Attends Religious Services: Not on file  . Active Member of Clubs or Organizations: Not on file  . Attends Theatre manager Meetings: Not on file  . Marital Status: Not on file   Family History  Problem Relation Age of Onset  . Cancer Mother   . Cancer Father    Allergies  Allergen Reactions  . Cefuroxime Axetil Nausea And Vomiting   Prior to Admission medications   Medication Sig Start Date End Date Taking? Authorizing Provider  Cholecalciferol (VITAMIN D3) 50 MCG (2000 UT) TABS Take by mouth daily.   Yes [provider]  gabapentin (NEURONTIN) 100 MG capsule Take 1 capsule (100 mg total) by mouth at bedtime. 02/19/20  Yes Dohmeier, Asencion Partridge, MD  ibuprofen (ADVIL) 600 MG tablet Take 600 mg by mouth daily.   Yes [provider]  Multiple Vitamins-Minerals (CENTRUM PO) Take 1 tablet by mouth daily.   Yes [provider]  PARoxetine (PAXIL) 20 MG tablet Take 20 mg by mouth daily.   Yes [provider]  vitamin C (ASCORBIC ACID) 250 MG tablet Take 250 mg by mouth daily.   Yes [provider]     Positive ROS: Otherwise negative  All other systems have been reviewed and were otherwise negative with the exception of those mentioned in the HPI and as above.  Physical Exam: Constitutional: Alert, well-appearing, no acute  distress Ears: External ears without lesions or tenderness.  Both ear canals are completely occluded with cerumen.  This was cleaned in the office using suction and curettes.  After cleaning the wax from the ear canals TMs were otherwise clear with improved hearing. Nasal: External nose without lesions.. Clear nasal passages Oral: Lips and gums without lesions. Tongue and palate mucosa without lesions. Posterior oropharynx clear. Neck: No palpable adenopathy or masses Respiratory: Breathing comfortably  Skin: No facial/neck lesions or rash noted.  Cerumen impaction removal  Date/Time: 06/22/2020 12:59 PM Performed by: Rozetta Nunnery, MD Authorized by: Rozetta Nunnery, MD   Consent:    Consent obtained:  Verbal    Consent given by:  Patient   Risks discussed:  Pain and bleeding Procedure details:    Location:  L ear and R ear   Procedure type: curette and suction   Post-procedure details:    Inspection:  TM intact and canal normal   Hearing quality:  Improved   Patient tolerance of procedure:  Tolerated well, no immediate complications Comments:     TMs are clear bilaterally.    Assessment: Bilateral cerumen impactions  Plan: This was cleaned in the office with curettes and suction.  TMs were otherwise clear.   Radene Journey, MD   CC:

## 2020-07-26 DIAGNOSIS — Z1231 Encounter for screening mammogram for malignant neoplasm of breast: Secondary | ICD-10-CM | POA: Diagnosis not present

## 2020-08-09 DIAGNOSIS — H5203 Hypermetropia, bilateral: Secondary | ICD-10-CM | POA: Diagnosis not present

## 2020-08-09 DIAGNOSIS — H2513 Age-related nuclear cataract, bilateral: Secondary | ICD-10-CM | POA: Diagnosis not present

## 2020-10-11 DIAGNOSIS — E785 Hyperlipidemia, unspecified: Secondary | ICD-10-CM | POA: Diagnosis not present

## 2020-10-11 DIAGNOSIS — M859 Disorder of bone density and structure, unspecified: Secondary | ICD-10-CM | POA: Diagnosis not present

## 2020-10-12 DIAGNOSIS — K219 Gastro-esophageal reflux disease without esophagitis: Secondary | ICD-10-CM | POA: Diagnosis not present

## 2020-10-12 DIAGNOSIS — Z124 Encounter for screening for malignant neoplasm of cervix: Secondary | ICD-10-CM | POA: Diagnosis not present

## 2020-10-12 DIAGNOSIS — F329 Major depressive disorder, single episode, unspecified: Secondary | ICD-10-CM | POA: Diagnosis not present

## 2020-10-12 DIAGNOSIS — Z0000 Encounter for general adult medical examination without abnormal findings: Secondary | ICD-10-CM | POA: Diagnosis not present

## 2020-10-12 DIAGNOSIS — M1990 Unspecified osteoarthritis, unspecified site: Secondary | ICD-10-CM | POA: Diagnosis not present

## 2020-10-12 DIAGNOSIS — R82998 Other abnormal findings in urine: Secondary | ICD-10-CM | POA: Diagnosis not present

## 2020-10-12 DIAGNOSIS — Z23 Encounter for immunization: Secondary | ICD-10-CM | POA: Diagnosis not present

## 2020-10-12 DIAGNOSIS — E559 Vitamin D deficiency, unspecified: Secondary | ICD-10-CM | POA: Diagnosis not present

## 2020-10-12 DIAGNOSIS — E663 Overweight: Secondary | ICD-10-CM | POA: Diagnosis not present

## 2020-10-12 DIAGNOSIS — E785 Hyperlipidemia, unspecified: Secondary | ICD-10-CM | POA: Diagnosis not present

## 2020-10-13 DIAGNOSIS — Z1212 Encounter for screening for malignant neoplasm of rectum: Secondary | ICD-10-CM | POA: Diagnosis not present

## 2020-11-04 DIAGNOSIS — F329 Major depressive disorder, single episode, unspecified: Secondary | ICD-10-CM | POA: Diagnosis not present

## 2020-11-04 DIAGNOSIS — Z124 Encounter for screening for malignant neoplasm of cervix: Secondary | ICD-10-CM | POA: Diagnosis not present

## 2020-11-04 DIAGNOSIS — R030 Elevated blood-pressure reading, without diagnosis of hypertension: Secondary | ICD-10-CM | POA: Diagnosis not present

## 2020-12-07 ENCOUNTER — Encounter (HOSPITAL_COMMUNITY): Payer: Self-pay

## 2020-12-07 ENCOUNTER — Emergency Department (HOSPITAL_COMMUNITY): Payer: Medicare PPO

## 2020-12-07 ENCOUNTER — Inpatient Hospital Stay (HOSPITAL_COMMUNITY)
Admission: EM | Admit: 2020-12-07 | Discharge: 2020-12-14 | DRG: 522 | Disposition: A | Discharge status: Skilled Nursing Facility | Payer: Medicare PPO | Attending: Internal Medicine | Admitting: Internal Medicine

## 2020-12-07 ENCOUNTER — Other Ambulatory Visit: Payer: Self-pay

## 2020-12-07 DIAGNOSIS — Z90710 Acquired absence of both cervix and uterus: Secondary | ICD-10-CM | POA: Diagnosis not present

## 2020-12-07 DIAGNOSIS — Z7401 Bed confinement status: Secondary | ICD-10-CM | POA: Diagnosis not present

## 2020-12-07 DIAGNOSIS — Z471 Aftercare following joint replacement surgery: Secondary | ICD-10-CM | POA: Diagnosis not present

## 2020-12-07 DIAGNOSIS — R41841 Cognitive communication deficit: Secondary | ICD-10-CM | POA: Diagnosis not present

## 2020-12-07 DIAGNOSIS — S72009A Fracture of unspecified part of neck of unspecified femur, initial encounter for closed fracture: Secondary | ICD-10-CM | POA: Diagnosis present

## 2020-12-07 DIAGNOSIS — Y93K1 Activity, walking an animal: Secondary | ICD-10-CM | POA: Diagnosis not present

## 2020-12-07 DIAGNOSIS — R339 Retention of urine, unspecified: Secondary | ICD-10-CM | POA: Diagnosis present

## 2020-12-07 DIAGNOSIS — F418 Other specified anxiety disorders: Secondary | ICD-10-CM | POA: Diagnosis not present

## 2020-12-07 DIAGNOSIS — D72829 Elevated white blood cell count, unspecified: Secondary | ICD-10-CM | POA: Diagnosis present

## 2020-12-07 DIAGNOSIS — E876 Hypokalemia: Secondary | ICD-10-CM | POA: Diagnosis present

## 2020-12-07 DIAGNOSIS — W19XXXA Unspecified fall, initial encounter: Secondary | ICD-10-CM

## 2020-12-07 DIAGNOSIS — R2681 Unsteadiness on feet: Secondary | ICD-10-CM | POA: Diagnosis not present

## 2020-12-07 DIAGNOSIS — Z888 Allergy status to other drugs, medicaments and biological substances status: Secondary | ICD-10-CM

## 2020-12-07 DIAGNOSIS — Z96642 Presence of left artificial hip joint: Secondary | ICD-10-CM | POA: Diagnosis not present

## 2020-12-07 DIAGNOSIS — M25552 Pain in left hip: Secondary | ICD-10-CM | POA: Diagnosis not present

## 2020-12-07 DIAGNOSIS — R21 Rash and other nonspecific skin eruption: Secondary | ICD-10-CM | POA: Diagnosis not present

## 2020-12-07 DIAGNOSIS — D7589 Other specified diseases of blood and blood-forming organs: Secondary | ICD-10-CM | POA: Diagnosis present

## 2020-12-07 DIAGNOSIS — M25559 Pain in unspecified hip: Secondary | ICD-10-CM | POA: Diagnosis not present

## 2020-12-07 DIAGNOSIS — I4892 Unspecified atrial flutter: Secondary | ICD-10-CM | POA: Diagnosis not present

## 2020-12-07 DIAGNOSIS — S72002A Fracture of unspecified part of neck of left femur, initial encounter for closed fracture: Secondary | ICD-10-CM | POA: Diagnosis not present

## 2020-12-07 DIAGNOSIS — M6281 Muscle weakness (generalized): Secondary | ICD-10-CM | POA: Diagnosis not present

## 2020-12-07 DIAGNOSIS — K5900 Constipation, unspecified: Secondary | ICD-10-CM | POA: Diagnosis not present

## 2020-12-07 DIAGNOSIS — F419 Anxiety disorder, unspecified: Secondary | ICD-10-CM | POA: Diagnosis present

## 2020-12-07 DIAGNOSIS — S72012A Unspecified intracapsular fracture of left femur, initial encounter for closed fracture: Secondary | ICD-10-CM | POA: Diagnosis present

## 2020-12-07 DIAGNOSIS — L24A9 Irritant contact dermatitis due friction or contact with other specified body fluids: Secondary | ICD-10-CM | POA: Diagnosis present

## 2020-12-07 DIAGNOSIS — Z20822 Contact with and (suspected) exposure to covid-19: Secondary | ICD-10-CM | POA: Diagnosis present

## 2020-12-07 DIAGNOSIS — Z79899 Other long term (current) drug therapy: Secondary | ICD-10-CM | POA: Diagnosis not present

## 2020-12-07 DIAGNOSIS — M25562 Pain in left knee: Secondary | ICD-10-CM | POA: Diagnosis not present

## 2020-12-07 DIAGNOSIS — W010XXA Fall on same level from slipping, tripping and stumbling without subsequent striking against object, initial encounter: Secondary | ICD-10-CM | POA: Diagnosis present

## 2020-12-07 DIAGNOSIS — F32A Depression, unspecified: Secondary | ICD-10-CM | POA: Diagnosis present

## 2020-12-07 DIAGNOSIS — S72042A Displaced fracture of base of neck of left femur, initial encounter for closed fracture: Secondary | ICD-10-CM | POA: Diagnosis not present

## 2020-12-07 DIAGNOSIS — M2550 Pain in unspecified joint: Secondary | ICD-10-CM | POA: Diagnosis not present

## 2020-12-07 DIAGNOSIS — I10 Essential (primary) hypertension: Secondary | ICD-10-CM | POA: Diagnosis not present

## 2020-12-07 DIAGNOSIS — E538 Deficiency of other specified B group vitamins: Secondary | ICD-10-CM | POA: Diagnosis present

## 2020-12-07 DIAGNOSIS — S82122A Displaced fracture of lateral condyle of left tibia, initial encounter for closed fracture: Secondary | ICD-10-CM

## 2020-12-07 DIAGNOSIS — W101XXA Fall (on)(from) sidewalk curb, initial encounter: Secondary | ICD-10-CM | POA: Diagnosis not present

## 2020-12-07 DIAGNOSIS — S72002D Fracture of unspecified part of neck of left femur, subsequent encounter for closed fracture with routine healing: Secondary | ICD-10-CM | POA: Diagnosis not present

## 2020-12-07 DIAGNOSIS — M25452 Effusion, left hip: Secondary | ICD-10-CM | POA: Diagnosis not present

## 2020-12-07 DIAGNOSIS — Z9181 History of falling: Secondary | ICD-10-CM | POA: Diagnosis not present

## 2020-12-07 MED ORDER — MORPHINE SULFATE (PF) 4 MG/ML IV SOLN
4.0000 mg | Freq: Once | INTRAVENOUS | Status: AC
  Administered 2020-12-07: 4 mg via INTRAVENOUS
  Filled 2020-12-07: qty 1

## 2020-12-07 MED ORDER — SODIUM CHLORIDE 0.9 % IV BOLUS: 500.0000 mL | Freq: Once | INTRAVENOUS | Status: DC

## 2020-12-07 MED ORDER — ONDANSETRON HCL 4 MG/2ML IJ SOLN
4.0000 mg | Freq: Once | INTRAMUSCULAR | Status: AC
  Administered 2020-12-07: 4 mg via INTRAVENOUS
  Filled 2020-12-07: qty 2

## 2020-12-07 MED ORDER — SODIUM CHLORIDE 0.9 % IV BOLUS
1000.0000 mL | Freq: Once | INTRAVENOUS | Status: AC
  Administered 2020-12-07: 1000 mL via INTRAVENOUS

## 2020-12-07 NOTE — ED Triage Notes (Signed)
Pt tripped over her dog at home and fell. Left hip pain. No LOC and no injury to head. Pt has ETOH on board, only had 1 drink

## 2020-12-07 NOTE — ED Notes (Signed)
Pt to X-Ray via stretcher 

## 2020-12-08 ENCOUNTER — Emergency Department (HOSPITAL_COMMUNITY): Payer: Medicare PPO

## 2020-12-08 ENCOUNTER — Encounter (HOSPITAL_COMMUNITY): Payer: Self-pay | Admitting: Student

## 2020-12-08 DIAGNOSIS — Z90710 Acquired absence of both cervix and uterus: Secondary | ICD-10-CM | POA: Diagnosis not present

## 2020-12-08 DIAGNOSIS — Z79899 Other long term (current) drug therapy: Secondary | ICD-10-CM | POA: Diagnosis not present

## 2020-12-08 DIAGNOSIS — Y93K1 Activity, walking an animal: Secondary | ICD-10-CM | POA: Diagnosis not present

## 2020-12-08 DIAGNOSIS — E538 Deficiency of other specified B group vitamins: Secondary | ICD-10-CM | POA: Diagnosis present

## 2020-12-08 DIAGNOSIS — S72009A Fracture of unspecified part of neck of unspecified femur, initial encounter for closed fracture: Secondary | ICD-10-CM | POA: Diagnosis present

## 2020-12-08 DIAGNOSIS — Z20822 Contact with and (suspected) exposure to covid-19: Secondary | ICD-10-CM | POA: Diagnosis present

## 2020-12-08 DIAGNOSIS — D72829 Elevated white blood cell count, unspecified: Secondary | ICD-10-CM | POA: Diagnosis present

## 2020-12-08 DIAGNOSIS — S72002A Fracture of unspecified part of neck of left femur, initial encounter for closed fracture: Secondary | ICD-10-CM | POA: Diagnosis not present

## 2020-12-08 DIAGNOSIS — F32A Depression, unspecified: Secondary | ICD-10-CM | POA: Diagnosis present

## 2020-12-08 DIAGNOSIS — K5900 Constipation, unspecified: Secondary | ICD-10-CM | POA: Diagnosis not present

## 2020-12-08 DIAGNOSIS — Z888 Allergy status to other drugs, medicaments and biological substances status: Secondary | ICD-10-CM | POA: Diagnosis not present

## 2020-12-08 DIAGNOSIS — E876 Hypokalemia: Secondary | ICD-10-CM | POA: Diagnosis present

## 2020-12-08 DIAGNOSIS — F419 Anxiety disorder, unspecified: Secondary | ICD-10-CM | POA: Diagnosis present

## 2020-12-08 DIAGNOSIS — D7589 Other specified diseases of blood and blood-forming organs: Secondary | ICD-10-CM | POA: Diagnosis present

## 2020-12-08 DIAGNOSIS — W010XXA Fall on same level from slipping, tripping and stumbling without subsequent striking against object, initial encounter: Secondary | ICD-10-CM | POA: Diagnosis present

## 2020-12-08 DIAGNOSIS — L24A9 Irritant contact dermatitis due friction or contact with other specified body fluids: Secondary | ICD-10-CM | POA: Diagnosis present

## 2020-12-08 DIAGNOSIS — S72012A Unspecified intracapsular fracture of left femur, initial encounter for closed fracture: Secondary | ICD-10-CM | POA: Diagnosis present

## 2020-12-08 DIAGNOSIS — W19XXXA Unspecified fall, initial encounter: Secondary | ICD-10-CM | POA: Diagnosis not present

## 2020-12-08 DIAGNOSIS — R339 Retention of urine, unspecified: Secondary | ICD-10-CM | POA: Diagnosis present

## 2020-12-08 LAB — CBC WITH DIFFERENTIAL/PLATELET
Abs Immature Granulocytes: 0.06 10*3/uL (ref 0.00–0.07)
Basophils Absolute: 0 10*3/uL (ref 0.0–0.1)
Basophils Relative: 0 %
Eosinophils Absolute: 0.1 10*3/uL (ref 0.0–0.5)
Eosinophils Relative: 1 %
HCT: 39.8 % (ref 36.0–46.0)
Hemoglobin: 13.2 g/dL (ref 12.0–15.0)
Immature Granulocytes: 0 %
Lymphocytes Relative: 19 %
Lymphs Abs: 3 10*3/uL (ref 0.7–4.0)
MCH: 34.1 pg — ABNORMAL HIGH (ref 26.0–34.0)
MCHC: 33.2 g/dL (ref 30.0–36.0)
MCV: 102.8 fL — ABNORMAL HIGH (ref 80.0–100.0)
Monocytes Absolute: 0.9 10*3/uL (ref 0.1–1.0)
Monocytes Relative: 6 %
Neutro Abs: 11.2 10*3/uL — ABNORMAL HIGH (ref 1.7–7.7)
Neutrophils Relative %: 74 %
Platelets: 214 10*3/uL (ref 150–400)
RBC: 3.87 MIL/uL (ref 3.87–5.11)
RDW: 12.7 % (ref 11.5–15.5)
WBC: 15.3 10*3/uL — ABNORMAL HIGH (ref 4.0–10.5)
nRBC: 0 % (ref 0.0–0.2)

## 2020-12-08 LAB — TYPE AND SCREEN
ABO/RH(D): O POS
Antibody Screen: NEGATIVE

## 2020-12-08 LAB — URINALYSIS, COMPLETE (UACMP) WITH MICROSCOPIC
Bacteria, UA: NONE SEEN
Bilirubin Urine: NEGATIVE
Glucose, UA: NEGATIVE mg/dL
Hgb urine dipstick: NEGATIVE
Ketones, ur: NEGATIVE mg/dL
Leukocytes,Ua: NEGATIVE
Nitrite: NEGATIVE
Protein, ur: NEGATIVE mg/dL
Specific Gravity, Urine: 1.017 (ref 1.005–1.030)
pH: 6 (ref 5.0–8.0)

## 2020-12-08 LAB — MAGNESIUM: Magnesium: 1.8 mg/dL (ref 1.7–2.4)

## 2020-12-08 LAB — VITAMIN B12: Vitamin B-12: 106 pg/mL — ABNORMAL LOW (ref 180–914)

## 2020-12-08 LAB — BASIC METABOLIC PANEL
Anion gap: 7 (ref 5–15)
BUN: 12 mg/dL (ref 8–23)
CO2: 24 mmol/L (ref 22–32)
Calcium: 8.1 mg/dL — ABNORMAL LOW (ref 8.9–10.3)
Chloride: 110 mmol/L (ref 98–111)
Creatinine, Ser: 0.66 mg/dL (ref 0.44–1.00)
GFR, Estimated: >60 mL/min (ref >60)
Glucose, Bld: 107 mg/dL — ABNORMAL HIGH (ref 70–99)
Potassium: 3.3 mmol/L — ABNORMAL LOW (ref 3.5–5.1)
Sodium: 141 mmol/L (ref 135–145)

## 2020-12-08 LAB — RESP PANEL BY RT-PCR (FLU A&B, COVID) ARPGX2
Influenza A by PCR: NEGATIVE
Influenza B by PCR: NEGATIVE
SARS Coronavirus 2 by RT PCR: NEGATIVE

## 2020-12-08 LAB — PROTIME-INR
INR: 1 (ref 0.8–1.2)
Prothrombin Time: 12.8 seconds (ref 11.4–15.2)

## 2020-12-08 LAB — SURGICAL PCR SCREEN
MRSA, PCR: NEGATIVE
Staphylococcus aureus: NEGATIVE

## 2020-12-08 LAB — ABO/RH: ABO/RH(D): O POS

## 2020-12-08 LAB — VITAMIN D 25 HYDROXY (VIT D DEFICIENCY, FRACTURES): Vit D, 25-Hydroxy: 33.6 ng/mL (ref 30–100)

## 2020-12-08 LAB — FOLATE: Folate: 13.2 ng/mL (ref >5.9)

## 2020-12-08 MED ORDER — SODIUM CHLORIDE 0.9 % IV SOLN: Freq: Once | INTRAVENOUS | Status: AC

## 2020-12-08 MED ORDER — CEFAZOLIN SODIUM-DEXTROSE 2-4 GM/100ML-% IV SOLN
2.0000 g | INTRAVENOUS | Status: AC
  Administered 2020-12-09: 2 g via INTRAVENOUS
  Filled 2020-12-08: qty 100

## 2020-12-08 MED ORDER — HYDROMORPHONE HCL 1 MG/ML IJ SOLN
1.0000 mg | Freq: Once | INTRAMUSCULAR | Status: AC
  Administered 2020-12-08: 1 mg via INTRAVENOUS
  Filled 2020-12-08: qty 1

## 2020-12-08 MED ORDER — HYDROCODONE-ACETAMINOPHEN 5-325 MG PO TABS
1.0000 | ORAL_TABLET | ORAL | Status: DC | PRN
  Administered 2020-12-09 – 2020-12-10 (×4): 1 via ORAL
  Administered 2020-12-11: 2 via ORAL
  Administered 2020-12-11: 1 via ORAL
  Filled 2020-12-08 (×5): qty 1
  Filled 2020-12-08 (×2): qty 2

## 2020-12-08 MED ORDER — POVIDONE-IODINE 10 % EX SWAB
2.0000 "application " | Freq: Once | CUTANEOUS | Status: AC
  Administered 2020-12-09: 2 via TOPICAL

## 2020-12-08 MED ORDER — MORPHINE SULFATE (PF) 2 MG/ML IV SOLN
0.5000 mg | INTRAVENOUS | Status: DC | PRN
Start: 2020-12-08 — End: 2020-12-15

## 2020-12-08 MED ORDER — POTASSIUM CHLORIDE CRYS ER 20 MEQ PO TBCR
20.0000 meq | EXTENDED_RELEASE_TABLET | Freq: Two times a day (BID) | ORAL | Status: DC
  Administered 2020-12-08 – 2020-12-14 (×13): 20 meq via ORAL
  Filled 2020-12-08 (×13): qty 1

## 2020-12-08 MED ORDER — POVIDONE-IODINE 10 % EX SWAB: 2.0000 "application " | Freq: Once | CUTANEOUS | Status: DC

## 2020-12-08 MED ORDER — MORPHINE SULFATE (PF) 2 MG/ML IV SOLN: 1.0000 mg | INTRAVENOUS | Status: DC | PRN

## 2020-12-08 MED ORDER — MORPHINE SULFATE (PF) 2 MG/ML IV SOLN: 2.0000 mg | INTRAVENOUS | Status: DC | PRN

## 2020-12-08 MED ORDER — CALCIUM CARBONATE 1250 (500 CA) MG PO TABS
1250.0000 mg | ORAL_TABLET | Freq: Every day | ORAL | Status: DC
  Administered 2020-12-08 – 2020-12-14 (×7): 1250 mg via ORAL
  Filled 2020-12-08 (×7): qty 1

## 2020-12-08 MED ORDER — PAROXETINE HCL 20 MG PO TABS
20.0000 mg | ORAL_TABLET | Freq: Every day | ORAL | Status: DC
  Administered 2020-12-08 – 2020-12-14 (×7): 20 mg via ORAL
  Filled 2020-12-08 (×7): qty 1

## 2020-12-08 MED ORDER — TRANEXAMIC ACID-NACL 1000-0.7 MG/100ML-% IV SOLN
1000.0000 mg | INTRAVENOUS | Status: AC
  Administered 2020-12-09: 1000 mg via INTRAVENOUS
  Filled 2020-12-08: qty 100

## 2020-12-08 MED ORDER — CHLORHEXIDINE GLUCONATE CLOTH 2 % EX PADS
6.0000 | MEDICATED_PAD | Freq: Every day | CUTANEOUS | Status: DC
  Administered 2020-12-08 – 2020-12-09 (×2): 6 via TOPICAL

## 2020-12-08 MED ORDER — HYDROMORPHONE HCL 1 MG/ML IJ SOLN
1.0000 mg | Freq: Once | INTRAMUSCULAR | Status: AC
Start: 2020-12-08 — End: 2020-12-08
  Administered 2020-12-08: 1 mg via INTRAVENOUS
  Filled 2020-12-08: qty 1

## 2020-12-08 MED ORDER — DOCUSATE SODIUM 100 MG PO CAPS
100.0000 mg | ORAL_CAPSULE | Freq: Two times a day (BID) | ORAL | Status: DC
  Administered 2020-12-08 – 2020-12-09 (×2): 100 mg via ORAL
  Filled 2020-12-08 (×2): qty 1

## 2020-12-08 MED ORDER — CENTRUM PO CHEW
1.0000 | CHEWABLE_TABLET | Freq: Every day | ORAL | Status: DC
  Administered 2020-12-08 – 2020-12-09 (×2): 1 via ORAL
  Filled 2020-12-08 (×2): qty 1

## 2020-12-08 MED ORDER — POTASSIUM CHLORIDE 10 MEQ/100ML IV SOLN
10.0000 meq | Freq: Once | INTRAVENOUS | Status: AC
  Administered 2020-12-08: 10 meq via INTRAVENOUS
  Filled 2020-12-08: qty 100

## 2020-12-08 MED ORDER — CHLORHEXIDINE GLUCONATE 4 % EX LIQD: 60.0000 mL | Freq: Once | CUTANEOUS | Status: DC

## 2020-12-08 MED ORDER — HYDROMORPHONE HCL 1 MG/ML IJ SOLN
0.5000 mg | INTRAMUSCULAR | Status: DC | PRN
  Administered 2020-12-08 – 2020-12-09 (×4): 0.5 mg via INTRAVENOUS
  Filled 2020-12-08 (×4): qty 0.5

## 2020-12-08 MED ORDER — ENOXAPARIN SODIUM 30 MG/0.3ML ~~LOC~~ SOLN
30.0000 mg | Freq: Once | SUBCUTANEOUS | Status: AC
  Administered 2020-12-08: 30 mg via SUBCUTANEOUS
  Filled 2020-12-08: qty 0.3

## 2020-12-08 MED ORDER — VITAMIN D3 25 MCG (1000 UNIT) PO TABS
2000.0000 [IU] | ORAL_TABLET | Freq: Every day | ORAL | Status: DC
  Administered 2020-12-08 – 2020-12-14 (×7): 2000 [IU] via ORAL
  Filled 2020-12-08 (×7): qty 2

## 2020-12-08 NOTE — Consult Note (Signed)
ORTHOPAEDIC CONSULTATION  REQUESTING PHYSICIAN: Jonnie Finner, DO  PCP:  Burnard Bunting, MD  Chief Complaint: Left hip pain  HPI: Deborah Dougherty is a 75 y.o. female who complains of left hip pain secondary to a fall on concrete. She has medical history significant for anxiety. She was walking her dog and tripped, landing on the sidewalk on her left side. No head injury or LOC. She was able to stand but had to stop due to pain. She was transported to [facility] ED for further evaluation and treatment.   In the ED imaging showed a left hip fracture. Orthopedics was consulted for management. She was admitted by the hospitalist service.  Past Medical History:  Diagnosis Date  . Anxiety   . Depression    Past Surgical History:  Procedure Laterality Date  . ABDOMINAL HYSTERECTOMY  1998  . TONSILLECTOMY  1955  . WRIST SURGERY  2010   Social History   Socioeconomic History  . Marital status: Widowed    Spouse name: Not on file  . Number of children: 1  . Years of education: Not on file  . Highest education level: Bachelor's degree (e.g., BA, AB, BS)  Occupational History  . Not on file  Tobacco Use  . Smoking status: Never Smoker  . Smokeless tobacco: Never Used  . Tobacco comment: quit 1969  Substance and Sexual Activity  . Alcohol use: Yes    Comment: occasionally, 6 oz daily  . Drug use: Never  . Sexual activity: Not on file  Other Topics Concern  . Not on file  Social History Narrative   Lives alone   Caffeine 10 oz daily   Social Determinants of Health   Financial Resource Strain: Not on file  Food Insecurity: Not on file  Transportation Needs: Not on file  Physical Activity: Not on file  Stress: Not on file  Social Connections: Not on file   Family History  Problem Relation Age of Onset  . Cancer Mother   . Cancer Father    Allergies  Allergen Reactions  . Cefuroxime Axetil Nausea And Vomiting   Prior to Admission medications   Medication  Sig Start Date End Date Taking? Authorizing Provider  calcium carbonate (OS-CAL) 1250 (500 Ca) MG chewable tablet Chew 1 tablet by mouth daily.   Yes [provider]  Cholecalciferol (VITAMIN D3) 50 MCG (2000 UT) TABS Take by mouth daily.   Yes [provider]  ibuprofen (ADVIL) 600 MG tablet Take 600 mg by mouth every 4 (four) hours as needed for mild pain.   Yes [provider]  Multiple Vitamins-Minerals (CENTRUM PO) Take 1 tablet by mouth daily.   Yes [provider]  PARoxetine (PAXIL) 20 MG tablet Take 20 mg by mouth daily.   Yes [provider]   CT Hip Left Wo Contrast  Result Date: 12/08/2020 CLINICAL DATA:  Fall, left hip pain, EXAM: CT OF THE LEFT HIP WITHOUT CONTRAST TECHNIQUE: Multidetector CT imaging of the left hip was performed according to the standard protocol. Multiplanar CT image reconstructions were also generated. COMPARISON:  None. FINDINGS: Bones/Joint/Cartilage There is an acute, minimally impacted, minimally posteriorly angulated subcapital left femoral neck fracture. The left femoral head is still seated within the left acetabulum. Moderate left femoroacetabular degenerative arthritis with asymmetric joint space narrowing and osteophyte formation. Ligaments Suboptimally assessed by CT. Muscles and Tendons Normal muscle bulk. Iliopsoas, gluteal, and hamstring tendons appear intact. Soft tissues Small left hip effusion. Small  fat containing left inguinal hernia. Mild sigmoid diverticulosis. IMPRESSION: Acute, minimally impacted, minimally angulated left subcapital femoral neck fracture. Moderate left femoroacetabular degenerative arthritis. Electronically Signed   By: Fidela Salisbury MD   On: 12/08/2020 01:12   DG Knee Complete 4 Views Left  Result Date: 12/07/2020 CLINICAL DATA:  Trip over dog leading to fall.  Left knee pain. EXAM: LEFT KNEE - COMPLETE 4+ VIEW COMPARISON:  None. FINDINGS: No fracture or dislocation. Minimal  degenerative changes peripheral spurring, as well as spurring of the tibial spines. There is a small knee joint effusion. Minimal quadriceps tendon enthesophyte. IMPRESSION: 1. No fracture or subluxation of the left knee. 2. Mild osteoarthritis and small joint effusion. Electronically Signed   By: Keith Rake M.D.   On: 12/07/2020 23:46   DG Hip Unilat With Pelvis 2-3 Views Left  Result Date: 12/07/2020 CLINICAL DATA:  Trip over dog leading to fall.  Left hip pain. EXAM: DG HIP (WITH OR WITHOUT PELVIS) 2-3V LEFT COMPARISON:  None. FINDINGS: Sclerosis involving the left femoral neck with question of cortical step-off laterally. Moderate left hip osteoarthritis with joint space narrowing and acetabular spurring. Enthesopathic change noted about the greater trochanter. The pubic rami are intact. Pubic symphysis and sacroiliac joints are congruent. IMPRESSION: 1. Possible nondisplaced left femoral neck fracture. Recommend further evaluation with CT or MRI. 2. Moderate left hip osteoarthritis. Electronically Signed   By: Keith Rake M.D.   On: 12/07/2020 23:44    Positive ROS: All other systems have been reviewed and were otherwise negative with the exception of those mentioned in the HPI and as above.  Physical Exam: General: Alert, no acute distress Cardiovascular: No pedal edema Respiratory: No cyanosis, no use of accessory musculature GI: No organomegaly, abdomen is soft and non-tender Skin: No lesions in the area of chief complaint. Small contusion on the posterolateral left hip Neurologic: Sensation intact distally Psychiatric: Patient is competent for consent with normal mood and affect Lymphatic: No axillary or cervical lymphadenopathy  MUSCULOSKELETAL: LLE roughly equal in length to RLE, no rotation noted. Lateral left hip TTP. Pain with ROM.    Assessment: Acute, minimally impacted, minimally angulated left subcapital femoral neck fracture  Plan: Left hip fracture: Discussed  surgical plan with the patient including risks and benefits of surgery. Patient consents to left THA. Plan for surgery Friday. NPO after midnight tonight. One time dose of lovenox then hold chemical DVT prophylaxis.    Dorothyann Peng, PA [phone]    12/08/2020 2:30 PM

## 2020-12-08 NOTE — H&P (View-Only) (Signed)
Consult received for left femoral neck fracture; CT shows retroversion of the femoral head and pre-existing DJD. Patient needs L THA. Due to lack of OR availability, surgery will be tomorrow. Can have diet today. NPO after MN. Will give OTO Lovenox now. Full consult to follow.

## 2020-12-08 NOTE — ED Notes (Signed)
Message sent to Dr. Marlowe Sax in secure chat regarding pain medication and admission orders on pt

## 2020-12-08 NOTE — H&P (Signed)
History and Physical    Deborah Dougherty AOZ:[id number] DOB: Sep 13, 1945 DOA: 12/07/2020  PCP: Burnard Bunting, MD  Patient coming from: Home  Chief Complaint: left hip pain  HPI: Deborah Dougherty is a 75 y.o. female with medical history significant of anxiety. Presenting with left hip pain. She reports that she was walking her dog last night when she got tripped up and fell to the sidewalk. It was a mechanical fall d/t her dog. There was no head injury or LOC. She fell on her left side. She remembers the entire fall. She was initially able to get up and walk about a block, but then she had to stop d/t pain. She screamed for help, and some time later, help arrived. She was transported to the ED for assistance. She denies any other aggravating or alleviating factors.    ED Course: She was found to have a left hip fracture. Orthopedics was consulted. [facility] was called for admission.   Review of Systems:  Denies CP, palpitations, dyspnea, N/V/D, abdominal pain, syncopal episodes. Review of systems is otherwise negative for all not mentioned in HPI.   PMHx Past Medical History:  Diagnosis Date  . Anxiety   . Depression     PSHx Past Surgical History:  Procedure Laterality Date  . ABDOMINAL HYSTERECTOMY  1998  . TONSILLECTOMY  1955  . WRIST SURGERY  2010    SocHx  reports that she has never smoked. She has never used smokeless tobacco. She reports current alcohol use. She reports that she does not use drugs.  Allergies  Allergen Reactions  . Cefuroxime Axetil Nausea And Vomiting    FamHx Family History  Problem Relation Age of Onset  . Cancer Mother   . Cancer Father     Prior to Admission medications   Medication Sig Start Date End Date Taking? Authorizing Provider  calcium carbonate (OS-CAL) 1250 (500 Ca) MG chewable tablet Chew 1 tablet by mouth daily.   Yes [provider]  Cholecalciferol (VITAMIN D3) 50 MCG (2000 UT) TABS Take by mouth daily.   Yes [provider]  ibuprofen (ADVIL) 600 MG tablet Take 600 mg by mouth every 4 (four) hours as needed for mild pain.   Yes [provider]  Multiple Vitamins-Minerals (CENTRUM PO) Take 1 tablet by mouth daily.   Yes [provider]  PARoxetine (PAXIL) 20 MG tablet Take 20 mg by mouth daily.   Yes [provider]    Physical Exam: Vitals:   12/08/20 0415 12/08/20 0430 12/08/20 0445 12/08/20 0540  BP: 108/67 102/61 112/62 105/73  Pulse: 82 86 85 84  Resp: (!) 21 (!) 22 16   Temp:    98.7 F (37.1 C)  TempSrc:    Oral  SpO2: 99% 100% 95% 97%  Weight:      Height:        General: 75 y.o. female resting in bed in NAD Eyes: PERRL, normal sclera ENMT: Nares patent w/o discharge, orophaynx clear, dentition normal, ears w/o discharge/lesions/ulcers Neck: Supple, trachea midline Cardiovascular: RRR, +S1, S2, no m/g/r, equal pulses throughout Respiratory: CTABL, no w/r/r, normal WOB GI: BS+, NDNT, no masses noted, no organomegaly noted MSK: No e/c/c; limited left hip ROM d/t pain Skin: No rashes, bruises, ulcerations noted Neuro: A&O x 3, no focal deficits Psyc: Appropriate interaction and affect, calm/cooperative  Labs on Admission: I have personally reviewed following labs and imaging studies  CBC: Recent Labs  Lab 12/08/20 0022  WBC 15.3*  NEUTROABS 11.2*  HGB 13.2  HCT 39.8  MCV 102.8*  PLT 765   Basic Metabolic Panel: Recent Labs  Lab 12/08/20 0022  NA 141  K 3.3*  CL 110  CO2 24  GLUCOSE 107*  BUN 12  CREATININE 0.66  CALCIUM 8.1*   GFR: Estimated Creatinine Clearance: 60 mL/min (by C-G formula based on SCr of 0.66 mg/dL). Liver Function Tests: No results for input(s): AST, ALT, ALKPHOS, BILITOT, PROT, ALBUMIN in the last 168 hours. No results for input(s): LIPASE, AMYLASE in the last 168 hours. No results for input(s): AMMONIA in the last 168 hours. Coagulation Profile: Recent Labs  Lab 12/08/20 0022  INR 1.0   Cardiac  Enzymes: No results for input(s): CKTOTAL, CKMB, CKMBINDEX, TROPONINI in the last 168 hours. BNP (last 3 results) No results for input(s): PROBNP in the last 8760 hours. HbA1C: No results for input(s): HGBA1C in the last 72 hours. CBG: No results for input(s): GLUCAP in the last 168 hours. Lipid Profile: No results for input(s): CHOL, HDL, LDLCALC, TRIG, CHOLHDL, LDLDIRECT in the last 72 hours. Thyroid Function Tests: No results for input(s): TSH, T4TOTAL, FREET4, T3FREE, THYROIDAB in the last 72 hours. Anemia Panel: No results for input(s): VITAMINB12, FOLATE, FERRITIN, TIBC, IRON, RETICCTPCT in the last 72 hours. Urine analysis:    Component Value Date/Time   COLORURINE YELLOW 01/25/2012 1836   APPEARANCEUR CLEAR 01/25/2012 1836   LABSPEC 1.010 01/25/2012 1836   PHURINE 7.5 01/25/2012 1836   GLUCOSEU NEGATIVE 01/25/2012 1836   HGBUR NEGATIVE 01/25/2012 1836   BILIRUBINUR NEGATIVE 01/25/2012 1836   KETONESUR NEGATIVE 01/25/2012 1836   PROTEINUR NEGATIVE 01/25/2012 1836   UROBILINOGEN 0.2 01/25/2012 1836   NITRITE NEGATIVE 01/25/2012 1836   LEUKOCYTESUR NEGATIVE 01/25/2012 1836    Radiological Exams on Admission: CT Hip Left Wo Contrast  Result Date: 12/08/2020 CLINICAL DATA:  Fall, left hip pain, EXAM: CT OF THE LEFT HIP WITHOUT CONTRAST TECHNIQUE: Multidetector CT imaging of the left hip was performed according to the standard protocol. Multiplanar CT image reconstructions were also generated. COMPARISON:  None. FINDINGS: Bones/Joint/Cartilage There is an acute, minimally impacted, minimally posteriorly angulated subcapital left femoral neck fracture. The left femoral head is still seated within the left acetabulum. Moderate left femoroacetabular degenerative arthritis with asymmetric joint space narrowing and osteophyte formation. Ligaments Suboptimally assessed by CT. Muscles and Tendons Normal muscle bulk. Iliopsoas, gluteal, and hamstring tendons appear intact. Soft tissues  Small left hip effusion. Small fat containing left inguinal hernia. Mild sigmoid diverticulosis. IMPRESSION: Acute, minimally impacted, minimally angulated left subcapital femoral neck fracture. Moderate left femoroacetabular degenerative arthritis. Electronically Signed   By: Fidela Salisbury MD   On: 12/08/2020 01:12   DG Knee Complete 4 Views Left  Result Date: 12/07/2020 CLINICAL DATA:  Trip over dog leading to fall.  Left knee pain. EXAM: LEFT KNEE - COMPLETE 4+ VIEW COMPARISON:  None. FINDINGS: No fracture or dislocation. Minimal degenerative changes peripheral spurring, as well as spurring of the tibial spines. There is a small knee joint effusion. Minimal quadriceps tendon enthesophyte. IMPRESSION: 1. No fracture or subluxation of the left knee. 2. Mild osteoarthritis and small joint effusion. Electronically Signed   By: Keith Rake M.D.   On: 12/07/2020 23:46   DG Hip Unilat With Pelvis 2-3 Views Left  Result Date: 12/07/2020 CLINICAL DATA:  Trip over dog leading to fall.  Left hip pain. EXAM: DG HIP (WITH OR WITHOUT PELVIS) 2-3V LEFT COMPARISON:  None. FINDINGS: Sclerosis involving the left  femoral neck with question of cortical step-off laterally. Moderate left hip osteoarthritis with joint space narrowing and acetabular spurring. Enthesopathic change noted about the greater trochanter. The pubic rami are intact. Pubic symphysis and sacroiliac joints are congruent. IMPRESSION: 1. Possible nondisplaced left femoral neck fracture. Recommend further evaluation with CT or MRI. 2. Moderate left hip osteoarthritis. Electronically Signed   By: Keith Rake M.D.   On: 12/07/2020 23:44   Assessment/Plan Left hip fracture     - admit to inpt, tele     - CT hip: Acute, minimally impacted, minimally angulated left subcapital femoral neck fracture     - ortho to take to OR in AM     - can have diet today and then NPO p MN     - getting OT dose of lovenox     - Consult TOC; needs PT/OT after  surgery  Anxiety     - continue home meds  Hypokalemia     - check Mg2+; replace K+  Macrocytosis Leukocytosis     - check B12, folate     - reactive? No fever. No resp symptoms. Check UA  Urinary retention     - bladder scan showed 680cc; no hx of urinary retention; place foley, check UA  DVT prophylaxis: lovenox  Code Status: FULL  Family Communication: None at bedside.  Consults called: Orthopedics   Status is: Inpatient  Remains inpatient appropriate because:Inpatient level of care appropriate due to severity of illness   Dispo: The patient is from: Home              Anticipated d/c is to: TBD              Patient currently is not medically stable to d/c.   Difficult to place patient No  Time spent coordinating admission: 70 minutes  [facility]  If 7PM-7AM, please contact night-coverage [email or web address]  12/08/2020, 8:45 AM

## 2020-12-08 NOTE — ED Notes (Signed)
Pt placed on 2L [place] for low O2 sat at 87%

## 2020-12-08 NOTE — ED Provider Notes (Signed)
[place] DEPT Provider Note   CSN: [id number] Arrival date & time: 12/07/20  2228     History Chief Complaint  Patient presents with  . Hip Pain  . Fall    Deborah Dougherty is a 75 y.o. female with a history of anxiety and depression who presents to the emergency department via EMS status post mechanical fall with complaints of left hip pain.  Patient states that she was walking and tripped over the dog falling onto her left hip.  She denies head injury or loss of consciousness.  She is having pain only to the left hip, worse with attempted movement, no alleviating factors.  She denies other areas of injury.  She denies numbness, tingling, or weakness.  She denies anticoagulation use.  She states that she last ate dinner at 6 PM.  She did have 1 alcoholic beverage tonight.   HPI     Past Medical History:  Diagnosis Date  . Anxiety   . Depression     There are no problems to display for this patient.   Past Surgical History:  Procedure Laterality Date  . ABDOMINAL HYSTERECTOMY  1998  . TONSILLECTOMY  1955  . WRIST SURGERY  2010     OB History   No obstetric history on file.     Family History  Problem Relation Age of Onset  . Cancer Mother   . Cancer Father     Social History   Tobacco Use  . Smoking status: Never Smoker  . Smokeless tobacco: Never Used  . Tobacco comment: quit 1969  Substance Use Topics  . Alcohol use: Yes    Comment: occasionally, 6 oz daily  . Drug use: Never    Home Medications Prior to Admission medications   Medication Sig Start Date End Date Taking? Authorizing Provider  Cholecalciferol (VITAMIN D3) 50 MCG (2000 UT) TABS Take by mouth daily.    [provider]  gabapentin (NEURONTIN) 100 MG capsule Take 1 capsule (100 mg total) by mouth at bedtime. 02/19/20   Dohmeier, Asencion Partridge, MD  ibuprofen (ADVIL) 600 MG tablet Take 600 mg by mouth daily.    [provider]  Multiple  Vitamins-Minerals (CENTRUM PO) Take 1 tablet by mouth daily.    [provider]  PARoxetine (PAXIL) 20 MG tablet Take 20 mg by mouth daily.    [provider]  vitamin C (ASCORBIC ACID) 250 MG tablet Take 250 mg by mouth daily.    [provider]    Allergies    Cefuroxime axetil  Review of Systems   Review of Systems  Constitutional: Negative for chills and fever.  Respiratory: Negative for shortness of breath.   Cardiovascular: Negative for chest pain.  Gastrointestinal: Negative for abdominal pain, nausea and vomiting.  Musculoskeletal: Positive for arthralgias. Negative for back pain and neck pain.  Neurological: Negative for syncope.  All other systems reviewed and are negative.   Physical Exam Updated Vital Signs BP 123/79   Pulse 78   Temp 98.8 F (37.1 C) (Oral)   Resp 14   Ht 5\' 7"  (1.702 m)   Wt 73 kg   SpO2 99%   BMI 25.22 kg/m   Physical Exam Vitals and nursing note reviewed.  Constitutional:      General: She is not in acute distress.    Appearance: She is well-developed. She is not ill-appearing or toxic-appearing.  HENT:     Head: Normocephalic and atraumatic. No raccoon eyes or  Battle's sign.     Right Ear: No hemotympanum.     Left Ear: No hemotympanum.  Eyes:     General:        Right eye: No discharge.        Left eye: No discharge.     Conjunctiva/sclera: Conjunctivae normal.     Pupils: Pupils are equal, round, and reactive to light.  Cardiovascular:     Rate and Rhythm: Normal rate and regular rhythm.     Pulses:          Dorsalis pedis pulses are 2+ on the right side and 2+ on the left side.       Posterior tibial pulses are 2+ on the right side and 2+ on the left side.     Heart sounds: No murmur heard.   Pulmonary:     Effort: Pulmonary effort is normal. No respiratory distress.     Breath sounds: Normal breath sounds. No wheezing or rales.  Chest:     Chest wall: No tenderness.  Abdominal:     General:  There is no distension.     Palpations: Abdomen is soft.     Tenderness: There is no abdominal tenderness. There is no guarding or rebound.  Musculoskeletal:     Cervical back: No spinous process tenderness.     Comments: Upper extremities: No obvious deformity, appreciable swelling, edema, erythema, ecchymosis, warmth, or open wounds.  Actively ranging all major joints.  No focal bony tenderness. Back: No midline tenderness to palpation or palpable step-off. Lower extremities: Left lower extremity appears mildly shortened and externally rotated.  Patient has an abrasion to the left anterior lower knee.  No significant ecchymosis.  No significant open wounds.  She has intact active range of motion throughout the right lower extremity.  She is able to fully plantar dorsiflex the left ankle is able to flex the left knee some and fully extend it, left hip range of motion limited secondary to pain.  Patient has intact AROM to bilateral hips, knees, ankles, and all digits. Tender to palpation over the left anterior and lateral hip. Otherwise nontender.   Skin:    General: Skin is warm and dry.     Capillary Refill: Capillary refill takes less than 2 seconds.     Findings: No rash.  Neurological:     Mental Status: She is alert.     Comments: Alert. Clear speech. Sensation grossly intact to bilateral lower extremities. 5/5 strength with plantar/dorsiflexion bilaterally.  Psychiatric:        Mood and Affect: Mood normal.        Behavior: Behavior normal.     ED Results / Procedures / Treatments   Labs (all labs ordered are listed, but only abnormal results are displayed) Labs Reviewed  BASIC METABOLIC PANEL - Abnormal; Notable for the following components:      Result Value   Potassium 3.3 (*)    Glucose, Bld 107 (*)    Calcium 8.1 (*)    All other components within normal limits  CBC WITH DIFFERENTIAL/PLATELET - Abnormal; Notable for the following components:   WBC 15.3 (*)    MCV 102.8  (*)    MCH 34.1 (*)    Neutro Abs 11.2 (*)    All other components within normal limits  RESP PANEL BY RT-PCR (FLU A&B, COVID) [id number]  PROTIME-INR  TYPE AND SCREEN  ABO/RH    EKG None  Radiology CT Hip Left Wo Contrast  Result  Date: 12/08/2020 CLINICAL DATA:  Fall, left hip pain, EXAM: CT OF THE LEFT HIP WITHOUT CONTRAST TECHNIQUE: Multidetector CT imaging of the left hip was performed according to the standard protocol. Multiplanar CT image reconstructions were also generated. COMPARISON:  None. FINDINGS: Bones/Joint/Cartilage There is an acute, minimally impacted, minimally posteriorly angulated subcapital left femoral neck fracture. The left femoral head is still seated within the left acetabulum. Moderate left femoroacetabular degenerative arthritis with asymmetric joint space narrowing and osteophyte formation. Ligaments Suboptimally assessed by CT. Muscles and Tendons Normal muscle bulk. Iliopsoas, gluteal, and hamstring tendons appear intact. Soft tissues Small left hip effusion. Small fat containing left inguinal hernia. Mild sigmoid diverticulosis. IMPRESSION: Acute, minimally impacted, minimally angulated left subcapital femoral neck fracture. Moderate left femoroacetabular degenerative arthritis. Electronically Signed   By: Fidela Salisbury MD   On: 12/08/2020 01:12   DG Knee Complete 4 Views Left  Result Date: 12/07/2020 CLINICAL DATA:  Trip over dog leading to fall.  Left knee pain. EXAM: LEFT KNEE - COMPLETE 4+ VIEW COMPARISON:  None. FINDINGS: No fracture or dislocation. Minimal degenerative changes peripheral spurring, as well as spurring of the tibial spines. There is a small knee joint effusion. Minimal quadriceps tendon enthesophyte. IMPRESSION: 1. No fracture or subluxation of the left knee. 2. Mild osteoarthritis and small joint effusion. Electronically Signed   By: Keith Rake M.D.   On: 12/07/2020 23:46   DG Hip Unilat With Pelvis 2-3 Views Left  Result Date:  12/07/2020 CLINICAL DATA:  Trip over dog leading to fall.  Left hip pain. EXAM: DG HIP (WITH OR WITHOUT PELVIS) 2-3V LEFT COMPARISON:  None. FINDINGS: Sclerosis involving the left femoral neck with question of cortical step-off laterally. Moderate left hip osteoarthritis with joint space narrowing and acetabular spurring. Enthesopathic change noted about the greater trochanter. The pubic rami are intact. Pubic symphysis and sacroiliac joints are congruent. IMPRESSION: 1. Possible nondisplaced left femoral neck fracture. Recommend further evaluation with CT or MRI. 2. Moderate left hip osteoarthritis. Electronically Signed   By: Keith Rake M.D.   On: 12/07/2020 23:44    Procedures Procedures   Medications Ordered in ED Medications  morphine 4 MG/ML injection 4 mg (4 mg Intravenous Given 12/07/20 2307)  ondansetron (ZOFRAN) injection 4 mg (4 mg Intravenous Given 12/07/20 2307)  sodium chloride 0.9 % bolus 1,000 mL (1,000 mLs Intravenous New Bag/Given 12/07/20 2314)  HYDROmorphone (DILAUDID) injection 1 mg (1 mg Intravenous Given 12/08/20 0020)    ED Course  I have reviewed the triage vital signs and the nursing notes.  Pertinent labs & imaging results that were available during my care of the patient were reviewed by me and considered in my medical decision making (see chart for details).    MDM Rules/Calculators/A&P                         Patient presents to the ED with complaints of left hip pain status post mechanical fall.  Nontoxic, vitals without significant abnormality.  No signs of serious head, neck, or back injury.  Chest and abdomen are nontender.  Focal tenderness over the left lateral hip  Additional history obtained:  Additional history obtained from chart review & nursing note review.   Imaging Studies ordered:  I ordered imaging studies which included left hip/knee x-rays, I independently reviewed, formal radiology impression shows:  L knee x-ray:  1. No fracture or  subluxation of the left knee. 2. Mild osteoarthritis and small joint  effusion. L hip x-ray:  1. Possible nondisplaced left femoral neck fracture. Recommend further evaluation with CT or MRI. 2. Moderate left hip osteoarthritis.  CT subsequently ordered of the left hip: Acute, minimally impacted, minimally angulated left subcapital femoral neck fracture. Moderate left femoroacetabular degenerative arthritis.  NVI distally in the left lower extremity.  Lab Tests:  I Ordered, reviewed, and interpreted labs, which included:  CBC: Leukocytosis, no significant anemia. BMP: Mild hypokalemia and mild hypocalcemia.  ED Course:  01:35: Patient updated on results & plan of care.  01:50: CONSULT: Discussed with orthopedic surgeon Dr. Lyla Glassing, admit to hospitalist service, keep n.p.o.  02:50: CONSULT: Discussed with Dr. Marlowe Sax with [facility]- accepts admission.   Portions of this note were generated with Lobby[employer]. Dictation errors may occur despite best attempts at proofreading.  Final Clinical Impression(s) / ED Diagnoses Final diagnoses:  Fall, initial encounter  Closed fracture of neck of left femur, initial encounter [facility])    Rx / DC Orders ED Discharge Orders    None       Leafy Kindle 12/08/20 0306    Palumbo, April, MD 12/08/20 [phone]

## 2020-12-08 NOTE — Progress Notes (Signed)
Consult received for left femoral neck fracture; CT shows retroversion of the femoral head and pre-existing DJD. Patient needs L THA. Due to lack of OR availability, surgery will be tomorrow. Can have diet today. NPO after MN. Will give OTO Lovenox now. Full consult to follow.

## 2020-12-08 NOTE — Progress Notes (Signed)
Orthopedic Tech Progress Note Patient Details:  Deborah Dougherty 1946/02/21 [id number]  Ortho Devices Ortho Device/Splint Location: trapeze bar Ortho Device/Splint Interventions: Application   Post Interventions Patient Tolerated: Well Instructions Provided: Care of device   [name] 12/08/2020, 10:11 AM

## 2020-12-08 NOTE — ED Notes (Signed)
Dr. Marlowe Sax paged for orders for pain medication. No orders present for any of admission

## 2020-12-08 NOTE — TOC Initial Note (Signed)
Transition of Care [facility]) - Initial/Assessment Note    Patient Details  Name: Deborah Dougherty MRN: [id number] Date of Birth: 10/11/1945  [facility] CM/SW Contact:    Dessa Phi, RN Phone Number: 12/08/2020, 1:53 PM  Clinical Narrative: Spoke to patient about d/c plans-home alone-Indep PTA;L THA in am;await PT cons & recc.                  Expected Discharge Plan: Skilled Nursing Facility Barriers to Discharge: Continued Medical Work up   Patient Goals and CMS Choice Patient states their goals for this hospitalization and ongoing recovery are:: go to rehab if needed. CMS Medicare.gov Compare Post Acute Care list provided to:: Patient    Expected Discharge Plan and Services Expected Discharge Plan: [place]   Discharge Planning Services: CM Consult   Living arrangements for the past 2 months: Single Family Home                                      Prior Living Arrangements/Services Living arrangements for the past 2 months: Single Family Home Lives with:: Self Patient language and need for interpreter reviewed:: Yes Do you feel safe going back to the place where you live?: Yes      Need for Family Participation in Patient Care: No (Comment) Care giver support system in place?: Yes (comment)   Criminal Activity/Legal Involvement Pertinent to Current Situation/Hospitalization: No - Comment as needed  Activities of Daily Living Home Assistive Devices/Equipment: Eyeglasses,Other (Comment) (walk-in shower, standard height toilet) ADL Screening (condition at time of admission) Patient's cognitive ability adequate to safely complete daily activities?: Yes Is the patient deaf or have difficulty hearing?: No Does the patient have difficulty seeing, even when wearing glasses/contacts?: No Does the patient have difficulty concentrating, remembering, or making decisions?: No Patient able to express need for assistance with ADLs?: Yes Does  the patient have difficulty dressing or bathing?: Yes Independently performs ADLs?: No Communication: Independent Dressing (OT): Needs assistance Is this a change from baseline?: Change from baseline, expected to last >3 days Grooming: Needs assistance Is this a change from baseline?: Change from baseline, expected to last >3 days Feeding: Needs assistance Is this a change from baseline?: Change from baseline, expected to last >3 days Bathing: Needs assistance Is this a change from baseline?: Change from baseline, expected to last >3 days Toileting: Dependent Is this a change from baseline?: Change from baseline, expected to last >3days In/Out Bed: Dependent Is this a change from baseline?: Change from baseline, expected to last >3 days Walks in Home: Dependent Is this a change from baseline?: Change from baseline, expected to last >3 days Does the patient have difficulty walking or climbing stairs?: Yes (secondary to left hip injury) Weakness of Legs: Left Weakness of Arms/Hands: None  Permission Sought/Granted Permission sought to share information with : Case Manager Permission granted to share information with : Yes, Verbal Permission Granted  Share Information with NAME: Case Manager     Permission granted to share info w Relationship: [name] son [phone]     Emotional Assessment Appearance:: Appears stated age Attitude/Demeanor/Rapport: Gracious Affect (typically observed): Accepting Orientation: : Oriented to Self,Oriented to Place,Oriented to  Time,Oriented to Situation Alcohol / Substance Use: Alcohol Use Psych Involvement: No (comment)  Admission diagnosis:  Hip fracture ([place]) [S72.009A] Fall, initial encounter [W19.XXXA] Closed fracture of neck of left femur, initial  encounter [facility]) [S72.002A] Patient Active Problem List   Diagnosis Date Noted  . Hip fracture ([place]) 12/08/2020   PCP:  Burnard Bunting, MD Pharmacy:   [facility] [address], [place] [address] Phone: [phone] Fax: [phone]     Social Determinants of Health (SDOH) Interventions    Readmission Risk Interventions No flowsheet data found.

## 2020-12-08 NOTE — Progress Notes (Signed)
Nutrition Brief Note RD working remotely.  Consult received per hip/femur fracture protocol.   Wt Readings from Last 15 Encounters:  12/07/20 73 kg  02/19/20 73.9 kg    Body mass index is 25.22 kg/m. Patient meets criteria for overweight based on current BMI. Weight yesterday was 161 lb, weight on 02/19/20 was 162 lb, and weight at [facility] on 11/12/16 was 161 lb.   She presented to the ED after falling while walking her dog; resulted in L hip fracture.   Current diet order is NPO. Labs and medications reviewed.   No nutrition interventions warranted at this time. If nutrition issues arise, please consult RD.      Jarome Matin, MS, RD, LDN, CNSC Inpatient Clinical Dietitian RD pager # available in [place]  After hours/weekend pager # available in [facility]

## 2020-12-09 ENCOUNTER — Encounter (HOSPITAL_COMMUNITY)
Admission: EM | Disposition: A | Discharge status: Skilled Nursing Facility | Payer: Self-pay | Source: Home / Self Care | Attending: Internal Medicine

## 2020-12-09 ENCOUNTER — Inpatient Hospital Stay (HOSPITAL_COMMUNITY): Payer: Medicare PPO

## 2020-12-09 ENCOUNTER — Inpatient Hospital Stay (HOSPITAL_COMMUNITY): Payer: Medicare PPO | Admitting: Anesthesiology

## 2020-12-09 ENCOUNTER — Encounter (HOSPITAL_COMMUNITY): Payer: Self-pay | Admitting: Internal Medicine

## 2020-12-09 DIAGNOSIS — W19XXXA Unspecified fall, initial encounter: Secondary | ICD-10-CM

## 2020-12-09 DIAGNOSIS — S72002A Fracture of unspecified part of neck of left femur, initial encounter for closed fracture: Secondary | ICD-10-CM

## 2020-12-09 HISTORY — PX: TOTAL HIP ARTHROPLASTY: SHX124

## 2020-12-09 LAB — BASIC METABOLIC PANEL
Anion gap: 7 (ref 5–15)
BUN: 8 mg/dL (ref 8–23)
CO2: 27 mmol/L (ref 22–32)
Calcium: 8.8 mg/dL — ABNORMAL LOW (ref 8.9–10.3)
Chloride: 104 mmol/L (ref 98–111)
Creatinine, Ser: 0.77 mg/dL (ref 0.44–1.00)
GFR, Estimated: >60 mL/min (ref >60)
Glucose, Bld: 200 mg/dL — ABNORMAL HIGH (ref 70–99)
Potassium: 3.9 mmol/L (ref 3.5–5.1)
Sodium: 138 mmol/L (ref 135–145)

## 2020-12-09 LAB — CBC
HCT: 39.1 % (ref 36.0–46.0)
Hemoglobin: 12.7 g/dL (ref 12.0–15.0)
MCH: 33.5 pg (ref 26.0–34.0)
MCHC: 32.5 g/dL (ref 30.0–36.0)
MCV: 103.2 fL — ABNORMAL HIGH (ref 80.0–100.0)
Platelets: 185 10*3/uL (ref 150–400)
RBC: 3.79 MIL/uL — ABNORMAL LOW (ref 3.87–5.11)
RDW: 12.1 % (ref 11.5–15.5)
WBC: 9.3 10*3/uL (ref 4.0–10.5)
nRBC: 0 % (ref 0.0–0.2)

## 2020-12-09 SURGERY — ARTHROPLASTY, HIP, TOTAL, ANTERIOR APPROACH
Anesthesia: Spinal | Site: Hip | Laterality: Left

## 2020-12-09 MED ORDER — DEXAMETHASONE SODIUM PHOSPHATE 10 MG/ML IJ SOLN
INTRAMUSCULAR | Status: DC | PRN
  Administered 2020-12-09: 8 mg via INTRAVENOUS

## 2020-12-09 MED ORDER — SODIUM CHLORIDE (PF) 0.9 % IJ SOLN
INTRAMUSCULAR | Status: DC | PRN
  Administered 2020-12-09: 50 mL
  Administered 2020-12-09: 30 mL

## 2020-12-09 MED ORDER — DEXAMETHASONE SODIUM PHOSPHATE 10 MG/ML IJ SOLN
INTRAMUSCULAR | Status: AC
  Filled 2020-12-09: qty 1

## 2020-12-09 MED ORDER — FENTANYL CITRATE (PF) 100 MCG/2ML IJ SOLN
INTRAMUSCULAR | Status: AC
  Administered 2020-12-09: 25 ug via INTRAVENOUS
  Filled 2020-12-09: qty 2

## 2020-12-09 MED ORDER — PROPOFOL 10 MG/ML IV BOLUS
INTRAVENOUS | Status: AC
  Filled 2020-12-09: qty 20

## 2020-12-09 MED ORDER — METOCLOPRAMIDE HCL 5 MG/ML IJ SOLN: 5.0000 mg | Freq: Three times a day (TID) | INTRAMUSCULAR | Status: DC | PRN

## 2020-12-09 MED ORDER — BUPIVACAINE-EPINEPHRINE 0.25% -1:200000 IJ SOLN
INTRAMUSCULAR | Status: AC
  Filled 2020-12-09: qty 1

## 2020-12-09 MED ORDER — 0.9 % SODIUM CHLORIDE (POUR BTL) OPTIME
TOPICAL | Status: DC | PRN
  Administered 2020-12-09: 1000 mL

## 2020-12-09 MED ORDER — KETOROLAC TROMETHAMINE 30 MG/ML IJ SOLN
INTRAMUSCULAR | Status: DC | PRN
  Administered 2020-12-09: 30 mg

## 2020-12-09 MED ORDER — FENTANYL CITRATE (PF) 100 MCG/2ML IJ SOLN
25.0000 ug | INTRAMUSCULAR | Status: DC | PRN
Start: 2020-12-09 — End: 2020-12-09

## 2020-12-09 MED ORDER — ONDANSETRON HCL 4 MG/2ML IJ SOLN: 4.0000 mg | Freq: Once | INTRAMUSCULAR | Status: DC | PRN

## 2020-12-09 MED ORDER — MENTHOL 3 MG MT LOZG: 1.0000 | LOZENGE | OROMUCOSAL | Status: DC | PRN

## 2020-12-09 MED ORDER — BUPIVACAINE IN DEXTROSE 0.75-8.25 % IT SOLN
INTRATHECAL | Status: DC | PRN
  Administered 2020-12-09: 1.6 mL via INTRATHECAL

## 2020-12-09 MED ORDER — LIDOCAINE 2% (20 MG/ML) 5 ML SYRINGE
INTRAMUSCULAR | Status: AC
  Filled 2020-12-09: qty 5

## 2020-12-09 MED ORDER — ONDANSETRON HCL 4 MG/2ML IJ SOLN: 4.0000 mg | Freq: Four times a day (QID) | INTRAMUSCULAR | Status: DC | PRN

## 2020-12-09 MED ORDER — PHENOL 1.4 % MT LIQD: 1.0000 | OROMUCOSAL | Status: DC | PRN

## 2020-12-09 MED ORDER — ONDANSETRON HCL 4 MG PO TABS: 4.0000 mg | ORAL_TABLET | Freq: Four times a day (QID) | ORAL | Status: DC | PRN

## 2020-12-09 MED ORDER — KETAMINE HCL 10 MG/ML IJ SOLN
INTRAMUSCULAR | Status: DC | PRN
  Administered 2020-12-09: 30 mg via INTRAVENOUS

## 2020-12-09 MED ORDER — ISOPROPYL ALCOHOL 70 % SOLN
Status: DC | PRN
  Administered 2020-12-09: 1 via TOPICAL

## 2020-12-09 MED ORDER — CEFAZOLIN SODIUM-DEXTROSE 2-4 GM/100ML-% IV SOLN
2.0000 g | Freq: Four times a day (QID) | INTRAVENOUS | Status: AC
  Administered 2020-12-09 – 2020-12-10 (×2): 2 g via INTRAVENOUS
  Filled 2020-12-09 (×2): qty 100

## 2020-12-09 MED ORDER — LACTATED RINGERS IV SOLN: INTRAVENOUS | Status: DC

## 2020-12-09 MED ORDER — WATER FOR IRRIGATION, STERILE IR SOLN
Status: DC | PRN
  Administered 2020-12-09: 2000 mL

## 2020-12-09 MED ORDER — BUPIVACAINE-EPINEPHRINE 0.25% -1:200000 IJ SOLN
INTRAMUSCULAR | Status: DC | PRN
  Administered 2020-12-09: 30 mL

## 2020-12-09 MED ORDER — KETOROLAC TROMETHAMINE 30 MG/ML IJ SOLN
INTRAMUSCULAR | Status: AC
  Filled 2020-12-09: qty 1

## 2020-12-09 MED ORDER — ASPIRIN EC 325 MG PO TBEC
325.0000 mg | DELAYED_RELEASE_TABLET | Freq: Every day | ORAL | Status: DC
  Administered 2020-12-10 – 2020-12-14 (×5): 325 mg via ORAL
  Filled 2020-12-09 (×5): qty 1

## 2020-12-09 MED ORDER — CHLORHEXIDINE GLUCONATE CLOTH 2 % EX PADS: 6.0000 | MEDICATED_PAD | Freq: Every day | CUTANEOUS | Status: DC

## 2020-12-09 MED ORDER — METOCLOPRAMIDE HCL 5 MG PO TABS: 5.0000 mg | ORAL_TABLET | Freq: Three times a day (TID) | ORAL | Status: DC | PRN

## 2020-12-09 MED ORDER — FENTANYL CITRATE (PF) 100 MCG/2ML IJ SOLN
INTRAMUSCULAR | Status: AC
  Filled 2020-12-09: qty 2

## 2020-12-09 MED ORDER — DOCUSATE SODIUM 100 MG PO CAPS
100.0000 mg | ORAL_CAPSULE | Freq: Two times a day (BID) | ORAL | Status: DC
  Administered 2020-12-09 – 2020-12-14 (×8): 100 mg via ORAL
  Filled 2020-12-09 (×8): qty 1

## 2020-12-09 MED ORDER — ONDANSETRON HCL 4 MG/2ML IJ SOLN
INTRAMUSCULAR | Status: AC
  Filled 2020-12-09: qty 2

## 2020-12-09 MED ORDER — SODIUM CHLORIDE 0.9 % IR SOLN
Status: DC | PRN
  Administered 2020-12-09: 1000 mL

## 2020-12-09 MED ORDER — SODIUM CHLORIDE (PF) 0.9 % IJ SOLN
INTRAMUSCULAR | Status: AC
  Filled 2020-12-09: qty 30

## 2020-12-09 MED ORDER — PROPOFOL 10 MG/ML IV BOLUS
INTRAVENOUS | Status: DC | PRN
  Administered 2020-12-09: 30 mg via INTRAVENOUS

## 2020-12-09 MED ORDER — ENSURE PRE-SURGERY PO LIQD
296.0000 mL | Freq: Once | ORAL | Status: AC
  Administered 2020-12-09: 296 mL via ORAL
  Filled 2020-12-09: qty 296

## 2020-12-09 MED ORDER — PROPOFOL 500 MG/50ML IV EMUL
INTRAVENOUS | Status: DC | PRN
  Administered 2020-12-09: 75 ug/kg/min via INTRAVENOUS

## 2020-12-09 MED ORDER — FENTANYL CITRATE (PF) 100 MCG/2ML IJ SOLN
INTRAMUSCULAR | Status: DC | PRN
  Administered 2020-12-09: 50 ug via INTRAVENOUS

## 2020-12-09 MED ORDER — ONDANSETRON HCL 4 MG/2ML IJ SOLN
INTRAMUSCULAR | Status: DC | PRN
  Administered 2020-12-09: 4 mg via INTRAVENOUS

## 2020-12-09 SURGICAL SUPPLY — 60 items
ACETAB CUP W/GRIPTION 54 (Plate) ×2 IMPLANT
BAG DECANTER FOR FLEXI CONT (MISCELLANEOUS) IMPLANT
BAG ZIPLOCK 12X15 (MISCELLANEOUS) IMPLANT
BLADE SURG SZ10 CARB STEEL (BLADE) IMPLANT
CHLORAPREP W/TINT 26 (MISCELLANEOUS) ×2 IMPLANT
COVER PERINEAL POST (MISCELLANEOUS) ×2 IMPLANT
COVER SURGICAL LIGHT HANDLE (MISCELLANEOUS) ×2 IMPLANT
COVER WAND RF STERILE (DRAPES) IMPLANT
CUP ACETAB W/GRIPTION 54 (Plate) IMPLANT
DECANTER SPIKE VIAL GLASS SM (MISCELLANEOUS) ×2 IMPLANT
DERMABOND ADVANCED (GAUZE/BANDAGES/DRESSINGS) ×1
DERMABOND ADVANCED .7 DNX12 (GAUZE/BANDAGES/DRESSINGS) ×2 IMPLANT
DRAPE IMP U-DRAPE 54X76 (DRAPES) ×2 IMPLANT
DRAPE SHEET LG 3/4 BI-LAMINATE (DRAPES) ×6 IMPLANT
DRAPE STERI IOBAN 125X83 (DRAPES) IMPLANT
DRAPE U-SHAPE 47X51 STRL (DRAPES) ×4 IMPLANT
DRSG AQUACEL AG ADV 3.5X10 (GAUZE/BANDAGES/DRESSINGS) ×2 IMPLANT
ELECT REM PT RETURN 15FT ADLT (MISCELLANEOUS) ×2 IMPLANT
GAUZE SPONGE 4X4 12PLY STRL (GAUZE/BANDAGES/DRESSINGS) ×2 IMPLANT
GLOVE SRG 8 PF TXTR STRL LF DI (GLOVE) ×1 IMPLANT
GLOVE SURG ENC MOIS LTX SZ8.5 (GLOVE) ×4 IMPLANT
GLOVE SURG ENC TEXT LTX SZ7.5 (GLOVE) ×4 IMPLANT
GLOVE SURG UNDER POLY LF SZ8 (GLOVE) ×2
GLOVE SURG UNDER POLY LF SZ8.5 (GLOVE) ×2 IMPLANT
GOWN SPEC L3 XXLG W/TWL (GOWN DISPOSABLE) ×2 IMPLANT
GOWN STRL REUS W/ TWL LRG LVL3 (GOWN DISPOSABLE) ×1 IMPLANT
GOWN STRL REUS W/TWL LRG LVL3 (GOWN DISPOSABLE) ×2
HANDPIECE INTERPULSE COAX TIP (DISPOSABLE) ×1
HEAD CERAMIC 36 PLUS5 (Hips) ×1 IMPLANT
HOLDER FOLEY CATH W/STRAP (MISCELLANEOUS) ×2 IMPLANT
HOOD PEEL AWAY FLYTE STAYCOOL (MISCELLANEOUS) ×8 IMPLANT
IRRIGATION SURGIPHOR STRL (IV SOLUTION) IMPLANT
KIT TURNOVER KIT A (KITS) ×2 IMPLANT
LINER NEUTRAL 54X36MM PLUS 4 (Hips) ×1 IMPLANT
MANIFOLD NEPTUNE II (INSTRUMENTS) ×2 IMPLANT
MARKER SKIN DUAL TIP RULER LAB (MISCELLANEOUS) ×2 IMPLANT
NDL SAFETY ECLIPSE 18X1.5 (NEEDLE) ×1 IMPLANT
NDL SPNL 18GX3.5 QUINCKE PK (NEEDLE) ×1 IMPLANT
NEEDLE HYPO 18GX1.5 SHARP (NEEDLE) ×2
NEEDLE SPNL 18GX3.5 QUINCKE PK (NEEDLE) ×2 IMPLANT
PACK ANTERIOR HIP CUSTOM (KITS) ×2 IMPLANT
PENCIL SMOKE EVACUATOR (MISCELLANEOUS) IMPLANT
SAW OSC TIP CART 19.5X105X1.3 (SAW) ×2 IMPLANT
SEALER BIPOLAR AQUA 6.0 (INSTRUMENTS) ×2 IMPLANT
SET HNDPC FAN SPRY TIP SCT (DISPOSABLE) ×1 IMPLANT
STEM TRI LOC BPS GRIPTON SZ 5 (Hips) IMPLANT
SUT ETHIBOND NAB CT1 #1 30IN (SUTURE) ×4 IMPLANT
SUT MNCRL AB 3-0 PS2 18 (SUTURE) ×2 IMPLANT
SUT MNCRL AB 4-0 PS2 18 (SUTURE) ×2 IMPLANT
SUT MON AB 2-0 CT1 36 (SUTURE) ×4 IMPLANT
SUT STRATAFIX PDO 1 14 VIOLET (SUTURE) ×2
SUT STRATFX PDO 1 14 VIOLET (SUTURE) ×1
SUT VIC AB 2-0 CT1 27 (SUTURE) ×2
SUT VIC AB 2-0 CT1 TAPERPNT 27 (SUTURE) ×1 IMPLANT
SUTURE STRATFX PDO 1 14 VIOLET (SUTURE) ×1 IMPLANT
SYR 3ML LL SCALE MARK (SYRINGE) ×2 IMPLANT
TRAY FOLEY MTR SLVR 16FR STAT (SET/KITS/TRAYS/PACK) IMPLANT
TRI LOC BPS W GRIPTON SZ 5 (Hips) ×2 IMPLANT
TUBE SUCTION HIGH CAP CLEAR NV (SUCTIONS) ×2 IMPLANT
WATER STERILE IRR 1000ML POUR (IV SOLUTION) ×2 IMPLANT

## 2020-12-09 NOTE — Anesthesia Procedure Notes (Signed)
Spinal  Patient location during procedure: OR Start time: 12/09/2020 1:56 PM End time: 12/09/2020 2:00 PM Reason for block: surgical anesthesia Staffing Performed: anesthesiologist  Anesthesiologist: Josephine Igo, MD Preanesthetic Checklist Completed: patient identified, IV checked, site marked, risks and benefits discussed, surgical consent, monitors and equipment checked, pre-op evaluation and timeout performed Spinal Block Patient position: left lateral decubitus Prep: DuraPrep and site prepped and draped Patient monitoring: heart rate, cardiac monitor, continuous pulse ox and blood pressure Approach: midline Location: L3-4 Injection technique: single-shot Needle Needle type: Pencan  Needle gauge: 24 G Needle length: 9 cm Needle insertion depth: 6 cm Assessment Sensory level: T6 Events: CSF return Additional Notes Patient tolerated procedure well. Adequate sensory level.

## 2020-12-09 NOTE — Anesthesia Preprocedure Evaluation (Addendum)
Anesthesia Evaluation  Patient identified by MRN, date of birth, ID band Patient awake    Reviewed: Allergy & Precautions, NPO status , Patient's Chart, lab work & pertinent test results  Airway Mallampati: II  TM Distance: >3 FB Neck ROM: Full    Dental no notable dental hx. (+) Teeth Intact, Caps, Dental Advisory Given   Pulmonary neg pulmonary ROS,    Pulmonary exam normal breath sounds clear to auscultation       Cardiovascular negative cardio ROS Normal cardiovascular exam Rhythm:Regular Rate:Normal     Neuro/Psych PSYCHIATRIC DISORDERS Anxiety Depression negative neurological ROS     GI/Hepatic negative GI ROS, Neg liver ROS,   Endo/Other  negative endocrine ROS  Renal/GU negative Renal ROS  negative genitourinary   Musculoskeletal negative musculoskeletal ROS (+) Fx left hip   Abdominal   Peds  Hematology negative hematology ROS (+)   Anesthesia Other Findings   Reproductive/Obstetrics                            Anesthesia Physical Anesthesia Plan  ASA: II  Anesthesia Plan: Spinal   Post-op Pain Management:    Induction:   PONV Risk Score and Plan:   Airway Management Planned: Natural Airway and Simple Face Mask  Additional Equipment:   Intra-op Plan:   Post-operative Plan:   Informed Consent: I have reviewed the patients History and Physical, chart, labs and discussed the procedure including the risks, benefits and alternatives for the proposed anesthesia with the patient or authorized representative who has indicated his/her understanding and acceptance.     Dental advisory given  Plan Discussed with: CRNA and Anesthesiologist  Anesthesia Plan Comments:         Anesthesia Quick Evaluation

## 2020-12-09 NOTE — Progress Notes (Signed)
PROGRESS NOTE    Deborah Dougherty  HFW:[id number] DOB: Aug 11, 1946 DOA: 12/07/2020 PCP: Burnard Bunting, MD     Brief Narrative:  Deborah Dougherty is a 75 y.o. WF PMHx Anxiety, Depression.  Urinary retention  Presenting with left hip pain. She reports that she was walking her dog last night when she got tripped up and fell to the sidewalk. It was a mechanical fall d/t her dog. There was no head injury or LOC. She fell on her left side. She remembers the entire fall. She was initially able to get up and walk about a block, but then she had to stop d/t pain. She screamed for help, and some time later, help arrived. She was transported to the ED for assistance. She denies any other aggravating or alleviating factors.    ED Course: She was found to have a left hip fracture. Orthopedics was consulted. [facility] was called for admission.    Subjective: Afebrile overnight, attempted to see patient twice still in operating room.   Assessment & Plan: Covid vaccination;   Active Problems:   Hip fracture (HCC)   Left hip fracture -4/1 attempted to see patient x2.  Patient still in operating room no charge       Anxiety -Continue home meds  Hypokalemia -Resolved   Leukocytosis -Afebrile, negative bands, negative left shift.  Most likely reactive to hip fracture  Macrocytosis -B12, folate pending  Urinary retention - bladder scan showed 680cc; no hx of urinary retention; place foley, check UA -4/1 UA unremarkable.   DVT prophylaxis: Lovenox Code Status: Full Family Communication:  Status is: Inpatient    Dispo: The patient is from: Home              Anticipated d/c is to: SNF?              Anticipated d/c date is: Per orthopedic surgery              Patient currently unstable      Consultants:  [facility]   Procedures/Significant Events:    I have personally reviewed and interpreted all radiology studies and my findings are as above.  VENTILATOR  SETTINGS:    Cultures   Antimicrobials:    Devices    LINES / TUBES:      Continuous Infusions: .  ceFAZolin (ANCEF) IV    . tranexamic acid       Objective: Vitals:   12/08/20 1414 12/08/20 1819 12/08/20 2104 12/09/20 0532  BP: 112/60 130/81 124/64 120/65  Pulse: 70 69 75 68  Resp: 20 18 18 16   Temp: 98.7 F (37.1 C) 98.5 F (36.9 C) 98.8 F (37.1 C) 98.7 F (37.1 C)  TempSrc: Oral Oral Oral   SpO2: 95% 99% 98% 97%  Weight:      Height:        Intake/Output Summary (Last 24 hours) at 12/09/2020 0909 Last data filed at 12/09/2020 0615 Gross per 24 hour  Intake 164.69 ml  Output 2450 ml  Net -2285.31 ml   Filed Weights   12/07/20 2241  Weight: 73 kg    Examination:  Have attempted to see patient x2 still in operating room.  No charge .     Data Reviewed: Care during the described time interval was provided by me .  I have reviewed this patient's available data, including medical history, events of note, physical examination, and all test results as part of my evaluation.  CBC: Recent Labs  Lab 12/08/20 0022 12/09/20 0439  WBC 15.3* 9.3  NEUTROABS 11.2*  --   HGB 13.2 12.7  HCT 39.8 39.1  MCV 102.8* 103.2*  PLT 214 469   Basic Metabolic Panel: Recent Labs  Lab 12/08/20 0022 12/08/20 1023 12/09/20 0439  NA 141  --  138  K 3.3*  --  3.9  CL 110  --  104  CO2 24  --  27  GLUCOSE 107*  --  200*  BUN 12  --  8  CREATININE 0.66  --  0.77  CALCIUM 8.1*  --  8.8*  MG  --  1.8  --    GFR: Estimated Creatinine Clearance: 60 mL/min (by C-G formula based on SCr of 0.77 mg/dL). Liver Function Tests: No results for input(s): AST, ALT, ALKPHOS, BILITOT, PROT, ALBUMIN in the last 168 hours. No results for input(s): LIPASE, AMYLASE in the last 168 hours. No results for input(s): AMMONIA in the last 168 hours. Coagulation Profile: Recent Labs  Lab 12/08/20 0022  INR 1.0   Cardiac Enzymes: No results for input(s): CKTOTAL, CKMB,  CKMBINDEX, TROPONINI in the last 168 hours. BNP (last 3 results) No results for input(s): PROBNP in the last 8760 hours. HbA1C: No results for input(s): HGBA1C in the last 72 hours. CBG: No results for input(s): GLUCAP in the last 168 hours. Lipid Profile: No results for input(s): CHOL, HDL, LDLCALC, TRIG, CHOLHDL, LDLDIRECT in the last 72 hours. Thyroid Function Tests: No results for input(s): TSH, T4TOTAL, FREET4, T3FREE, THYROIDAB in the last 72 hours. Anemia Panel: Recent Labs    12/08/20 1023  VITAMINB12 106*  FOLATE 13.2   Sepsis Labs: No results for input(s): PROCALCITON, LATICACIDVEN in the last 168 hours.  Recent Results (from the past 240 hour(s))  Resp Panel by RT-PCR (Flu A&B, Covid) Nasopharyngeal Swab     Status: None   Collection Time: 12/08/20  1:32 AM   Specimen: Nasopharyngeal Swab; Nasopharyngeal(NP) swabs in vial transport medium  Result Value Ref Range Status   SARS Coronavirus 2 by RT PCR NEGATIVE NEGATIVE Final    Comment: (NOTE) SARS-CoV-2 target nucleic acids are NOT DETECTED.  The SARS-CoV-2 RNA is generally detectable in upper respiratory specimens during the acute phase of infection. The lowest concentration of SARS-CoV-2 viral copies this assay can detect is 138 copies/mL. A negative result does not preclude SARS-Cov-2 infection and should not be used as the sole basis for treatment or other patient management decisions. A negative result may occur with  improper specimen collection/handling, submission of specimen other than nasopharyngeal swab, presence of viral mutation(s) within the areas targeted by this assay, and inadequate number of viral copies(<138 copies/mL). A negative result must be combined with clinical observations, patient history, and epidemiological information. The expected result is Negative.  Fact Sheet for Patients:  EntrepreneurPulse.com.au  Fact Sheet for Healthcare Providers:   IncredibleEmployment.be  This test is no t yet approved or cleared by the [place] FDA and  has been authorized for detection and/or diagnosis of SARS-CoV-2 by FDA under an Emergency Use Authorization (EUA). This EUA will remain  in effect (meaning this test can be used) for the duration of the COVID-19 declaration under Section 564(b)(1) of the Act, 21 U.S.C.section 360bbb-3(b)(1), unless the authorization is terminated  or revoked sooner.       Influenza A by PCR NEGATIVE NEGATIVE Final   Influenza B by PCR NEGATIVE NEGATIVE Final    Comment: (NOTE) The Xpert Xpress SARS-CoV-2/FLU/RSV plus assay is intended  as an aid in the diagnosis of influenza from Nasopharyngeal swab specimens and should not be used as a sole basis for treatment. Nasal washings and aspirates are unacceptable for Xpert Xpress SARS-CoV-2/FLU/RSV testing.  Fact Sheet for Patients: EntrepreneurPulse.com.au  Fact Sheet for Healthcare Providers: IncredibleEmployment.be  This test is not yet approved or cleared by the [place] FDA and has been authorized for detection and/or diagnosis of SARS-CoV-2 by FDA under an Emergency Use Authorization (EUA). This EUA will remain in effect (meaning this test can be used) for the duration of the COVID-19 declaration under Section 564(b)(1) of the Act, 21 U.S.C. section 360bbb-3(b)(1), unless the authorization is terminated or revoked.  Performed at [facility], [place] [address], [place], [place] [address]   Surgical PCR screen     Status: None   Collection Time: 12/08/20  6:24 PM   Specimen: Nasal Mucosa; Nasal Swab  Result Value Ref Range Status   MRSA, PCR NEGATIVE NEGATIVE Final   Staphylococcus aureus NEGATIVE NEGATIVE Final    Comment: (NOTE) The Xpert SA Assay (FDA approved for NASAL specimens in patients 52 years of age and older), is one component of a  comprehensive surveillance program. It is not intended to diagnose infection nor to guide or monitor treatment. Performed at [facility], [place] [address], [place], [place] [address]          Radiology Studies: CT Hip Left Wo Contrast  Result Date: 12/08/2020 CLINICAL DATA:  Fall, left hip pain, EXAM: CT OF THE LEFT HIP WITHOUT CONTRAST TECHNIQUE: Multidetector CT imaging of the left hip was performed according to the standard protocol. Multiplanar CT image reconstructions were also generated. COMPARISON:  None. FINDINGS: Bones/Joint/Cartilage There is an acute, minimally impacted, minimally posteriorly angulated subcapital left femoral neck fracture. The left femoral head is still seated within the left acetabulum. Moderate left femoroacetabular degenerative arthritis with asymmetric joint space narrowing and osteophyte formation. Ligaments Suboptimally assessed by CT. Muscles and Tendons Normal muscle bulk. Iliopsoas, gluteal, and hamstring tendons appear intact. Soft tissues Small left hip effusion. Small fat containing left inguinal hernia. Mild sigmoid diverticulosis. IMPRESSION: Acute, minimally impacted, minimally angulated left subcapital femoral neck fracture. Moderate left femoroacetabular degenerative arthritis. Electronically Signed   By: Fidela Salisbury MD   On: 12/08/2020 01:12   DG Knee Complete 4 Views Left  Result Date: 12/07/2020 CLINICAL DATA:  Trip over dog leading to fall.  Left knee pain. EXAM: LEFT KNEE - COMPLETE 4+ VIEW COMPARISON:  None. FINDINGS: No fracture or dislocation. Minimal degenerative changes peripheral spurring, as well as spurring of the tibial spines. There is a small knee joint effusion. Minimal quadriceps tendon enthesophyte. IMPRESSION: 1. No fracture or subluxation of the left knee. 2. Mild osteoarthritis and small joint effusion. Electronically Signed   By: Keith Rake M.D.   On: 12/07/2020 23:46   DG Hip Unilat With Pelvis 2-3  Views Left  Result Date: 12/07/2020 CLINICAL DATA:  Trip over dog leading to fall.  Left hip pain. EXAM: DG HIP (WITH OR WITHOUT PELVIS) 2-3V LEFT COMPARISON:  None. FINDINGS: Sclerosis involving the left femoral neck with question of cortical step-off laterally. Moderate left hip osteoarthritis with joint space narrowing and acetabular spurring. Enthesopathic change noted about the greater trochanter. The pubic rami are intact. Pubic symphysis and sacroiliac joints are congruent. IMPRESSION: 1. Possible nondisplaced left femoral neck fracture. Recommend further evaluation with CT or MRI. 2. Moderate left hip osteoarthritis. Electronically Signed   By: Threasa Beards  Sanford M.D.   On: 12/07/2020 23:44        Scheduled Meds: . calcium carbonate  1,250 mg Oral Daily  . chlorhexidine  60 mL Topical Once  . Chlorhexidine Gluconate Cloth  6 each Topical Daily  . cholecalciferol  2,000 Units Oral Daily  . docusate sodium  100 mg Oral BID  . multivitamin-iron-minerals-folic acid  1 tablet Oral Daily  . PARoxetine  20 mg Oral Daily  . potassium chloride  20 mEq Oral BID  . povidone-iodine  2 application Topical Once   Continuous Infusions: .  ceFAZolin (ANCEF) IV    . tranexamic acid       LOS: 1 day    Time spent:0 min    Yuliya Nova, Geraldo Docker, MD [facility]   If 7PM-7AM, please contact night-coverage 12/09/2020, 9:09 AM

## 2020-12-09 NOTE — Op Note (Signed)
OPERATIVE REPORT  SURGEON: Rod Can, MD   ASSISTANT: Cherlynn June, Pa-C  PREOPERATIVE DIAGNOSIS: Displaced Left femoral neck fracture.   POSTOPERATIVE DIAGNOSIS: Displaced Left femoral neck fracture.   PROCEDURE: Left total hip arthroplasty, anterior approach.   IMPLANTS: DePuy Tri Lock stem, size 5, hi offset. DePuy Pinnacle Cup, size 54 mm. DePuy Altrx liner, size 36 by 54 mm, +4 neutral. DePuy Biolox ceramic head ball, size 36 + 5 mm.  ANESTHESIA:  MAC and Spinal  ANTIBIOTICS: 2g ancef.  ESTIMATED BLOOD LOSS:-150 mL    DRAINS: None.  COMPLICATIONS: None   CONDITION: PACU - hemodynamically stable.   BRIEF CLINICAL NOTE: Deborah Dougherty is a 75 y.o. female with a displaced Left femoral neck fracture. The patient was admitted to the hospitalist service and underwent perioperative risk stratification and medical optimization. The risks, benefits, and alternatives to total hip arthroplasty were explained, and the patient elected to proceed.  PROCEDURE IN DETAIL: The patient was taken to the operating room and general anesthesia was induced on the hospital bed.  The patient was then positioned on the Hana table.  All bony prominences were well padded.  The hip was prepped and draped in the normal sterile surgical fashion.  A time-out was called verifying side and site of surgery. Antibiotics were given within 60 minutes of beginning the procedure.  The direct anterior approach to the hip was performed through the Hueter interval.  Lateral femoral circumflex vessels were treated with the Auqumantys. The anterior capsule was exposed and an inverted T capsulotomy was made.  Fracture hematoma was encountered and evacuated. The patient was found to have a comminuted Left subcapital femoral neck fracture.  I freshened the femoral neck cut with a saw.  I removed the femoral neck fragment.  A corkscrew was placed into the head and the head was removed.  This was passed to the back  table and was measured.   Acetabular exposure was achieved, and the pulvinar and labrum were excised. Sequential reaming of the acetabulum was then performed up to a size 53 mm reamer. A 54 mm cup was then opened and impacted into place at approximately 40 degrees of abduction and 20 degrees of anteversion. The final polyethylene liner was impacted into place.    I then gained femoral exposure taking care to protect the abductors and greater trochanter.  This was performed using standard external rotation, extension, and adduction.  The capsule was peeled off the inner aspect of the greater trochanter, taking care to preserve the short external rotators. A cookie cutter was used to enter the femoral canal, and then the femoral canal finder was used to confirm location.  I then sequentially broached up to a size 5.  Calcar planer was used on the femoral neck remnant.  I paced a hi neck and a trial head ball. The hip was reduced.  Leg lengths were checked fluoroscopically.  The hip was dislocated and trial components were removed.  I placed the real stem followed by the real spacer and head ball.  The hip was reduced.  Fluoroscopy was used to confirm component position and leg lengths.  At 90 degrees of external rotation and extension, the hip was stable to an anterior directed force.   The wound was copiously irrigated with Irrisept solution and normal saline using pule lavage.  Marcaine solution was injected into the periarticular soft tissue.  The wound was closed in layers using #1 Vicryl and V-Loc for the fascia, 2-0 Vicryl for  the subcutaneous fat, 2-0 Monocryl for the deep dermal layer, 3-0 running Monocryl subcuticular stitch and glue for the skin.  Once the glue was fully dried, an Aquacell Ag dressing was applied.  The patient was then awakened from anesthesia and transported to the recovery room in stable condition.  Sponge, needle, and instrument counts were correct at the end of the case x2.  The  patient tolerated the procedure well and there were no known complications.  Please note that a surgical assistant was a medical necessity for this procedure to perform it in a safe and expeditious manner. Assistant was necessary to provide appropriate retraction of vital neurovascular structures, to prevent femoral fracture, and to allow for anatomic placement of the prosthesis.

## 2020-12-09 NOTE — Anesthesia Procedure Notes (Signed)
Date/Time: 12/09/2020 1:53 PM Performed by: Sharlette Dense, CRNA Oxygen Delivery Method: Simple face mask

## 2020-12-09 NOTE — Discharge Instructions (Signed)
°Dr. Jazzmon Prindle °Joint Replacement Specialist °[facility] °[address] °[place], [place] [address] °[phone] ° ° °TOTAL HIP REPLACEMENT POSTOPERATIVE DIRECTIONS ° ° ° °Hip Rehabilitation, Guidelines Following Surgery  ° °WEIGHT BEARING °Weight bearing as tolerated with assist device (walker, cane, etc) as directed, use it as long as suggested by your surgeon or therapist, typically at least 4-6 weeks. ° °The results of a hip operation are greatly improved after range of motion and muscle strengthening exercises. Follow all safety measures which are given to protect your hip. If any of these exercises cause increased pain or swelling in your joint, decrease the amount until you are comfortable again. Then slowly increase the exercises. Call your caregiver if you have problems or questions.  ° °HOME CARE INSTRUCTIONS  °Most of the following instructions are designed to prevent the dislocation of your new hip.  °Remove items at home which could result in a fall. This includes throw rugs or furniture in walking pathways.  °Continue medications as instructed at time of discharge. °· You may have some home medications which will be placed on hold until you complete the course of blood thinner medication. °· You may start showering once you are discharged home. Do not remove your dressing. °Do not put on socks or shoes without following the instructions of your caregivers.   °Sit on chairs with arms. Use the chair arms to help push yourself up when arising.  °Arrange for the use of a toilet seat elevator so you are not sitting low.  °· Walk with walker as instructed.  °You may resume a sexual relationship in one month or when given the OK by your caregiver.  °Use walker as long as suggested by your caregivers.  °You may put full weight on your legs and walk as much as is comfortable. °Avoid periods of inactivity such as sitting longer than an hour when not asleep. This helps prevent  blood clots.  °You may return to work once you are cleared by your surgeon.  °Do not drive a car for 6 weeks or until released by your surgeon.  °Do not drive while taking narcotics.  °Wear elastic stockings for two weeks following surgery during the day but you may remove then at night.  °Make sure you keep all of your appointments after your operation with all of your doctors and caregivers. You should call the office at the above phone number and make an appointment for approximately two weeks after the date of your surgery. °Please pick up a stool softener and laxative for home use as long as you are requiring pain medications. °· ICE to the affected hip every three hours for 30 minutes at a time and then as needed for pain and swelling. Continue to use ice on the hip for pain and swelling from surgery. You may notice swelling that will progress down to the foot and ankle.  This is normal after surgery.  Elevate the leg when you are not up walking on it.   °It is important for you to complete the blood thinner medication as prescribed by your doctor. °· Continue to use the breathing machine which will help keep your temperature down.  It is common for your temperature to cycle up and down following surgery, especially at night when you are not up moving around and exerting yourself.  The breathing machine keeps your lungs expanded and your temperature down. ° °RANGE OF MOTION AND STRENGTHENING EXERCISES  °These exercises are   designed to help you keep full movement of your hip joint. Follow your caregiver's or physical therapist's instructions. Perform all exercises about fifteen times, three times per day or as directed. Exercise both hips, even if you have had only one joint replacement. These exercises can be done on a training (exercise) mat, on the floor, on a table or on a bed. Use whatever works the best and is most comfortable for you. Use music or television while you are exercising so that the exercises  are a pleasant break in your day. This will make your life better with the exercises acting as a break in routine you can look forward to.  °Lying on your back, slowly slide your foot toward your buttocks, raising your knee up off the floor. Then slowly slide your foot back down until your leg is straight again.  °Lying on your back spread your legs as far apart as you can without causing discomfort.  °Lying on your side, raise your upper leg and foot straight up from the floor as far as is comfortable. Slowly lower the leg and repeat.  °Lying on your back, tighten up the muscle in the front of your thigh (quadriceps muscles). You can do this by keeping your leg straight and trying to raise your heel off the floor. This helps strengthen the largest muscle supporting your knee.  °Lying on your back, tighten up the muscles of your buttocks both with the legs straight and with the knee bent at a comfortable angle while keeping your heel on the floor.  ° °SKILLED REHAB INSTRUCTIONS: °If the patient is transferred to a skilled rehab facility following release from the hospital, a list of the current medications will be sent to the facility for the patient to continue.  When discharged from the skilled rehab facility, please have the facility set up the patient's Home Health Physical Therapy prior to being released. Also, the skilled facility will be responsible for providing the patient with their medications at time of release from the facility to include their pain medication and their blood thinner medication. If the patient is still at the rehab facility at time of the two week follow up appointment, the skilled rehab facility will also need to assist the patient in arranging follow up appointment in our office and any transportation needs. ° °MAKE SURE YOU:  °Understand these instructions.  °Will watch your condition.  °Will get help right away if you are not doing well or get worse. ° °Pick up stool softner and  laxative for home use following surgery while on pain medications. °Do not remove your dressing. °The dressing is waterproof--it is OK to take showers. °Continue to use ice for pain and swelling after surgery. °Do not use any lotions or creams on the incision until instructed by your surgeon. °Total Hip Protocol. ° ° °

## 2020-12-09 NOTE — Interval H&P Note (Signed)
History and Physical Interval Note:  12/09/2020 1:34 PM  Cullen Vanallen Head  has presented today for surgery, with the diagnosis of Left Hip Fracture.  The various methods of treatment have been discussed with the patient and family. After consideration of risks, benefits and other options for treatment, the patient has consented to  Procedure(s): TOTAL HIP ARTHROPLASTY ANTERIOR APPROACH (Left) as a surgical intervention.  The patient's history has been reviewed, patient examined, no change in status, stable for surgery.  I have reviewed the patient's chart and labs.  Questions were answered to the patient's satisfaction.    The risks, benefits, and alternatives were discussed with the patient. There are risks associated with the surgery including, but not limited to, problems with anesthesia (death), infection, instability (giving out of the joint), dislocation, differences in leg length/angulation/rotation, fracture of bones, loosening or failure of implants, hematoma (blood accumulation) which may require surgical drainage, blood clots, pulmonary embolism, nerve injury (foot drop and lateral thigh numbness), and blood vessel injury. The patient understands these risks and elects to proceed.    Hilton Cork Lochlyn Zullo

## 2020-12-09 NOTE — Anesthesia Postprocedure Evaluation (Signed)
Anesthesia Post Note  Patient: Deborah Dougherty  Procedure(s) Performed: TOTAL HIP ARTHROPLASTY ANTERIOR APPROACH (Left Hip)     Patient location during evaluation: PACU Anesthesia Type: Spinal Level of consciousness: oriented and awake and alert Pain management: pain level controlled Vital Signs Assessment: post-procedure vital signs reviewed and stable Respiratory status: spontaneous breathing, respiratory function stable and nonlabored ventilation Cardiovascular status: blood pressure returned to baseline and stable Postop Assessment: no headache, no backache, no apparent nausea or vomiting, patient able to bend at knees and spinal receding Anesthetic complications: no   No complications documented.  Last Vitals:  Vitals:   12/09/20 1730 12/09/20 1745  BP:  130/62  Pulse:  66  Resp:  14  Temp:  36.7 C  SpO2: 96% 99%    Last Pain:  Vitals:   12/09/20 1745  TempSrc: Oral  PainSc:                  Timon Geissinger A.

## 2020-12-09 NOTE — Transfer of Care (Signed)
Immediate Anesthesia Transfer of Care Note  Patient: Deborah Dougherty  Procedure(s) Performed: TOTAL HIP ARTHROPLASTY ANTERIOR APPROACH (Left Hip)  Patient Location: PACU  Anesthesia Type:General  Level of Consciousness: awake and alert   Airway & Oxygen Therapy: Patient Spontanous Breathing and Patient connected to face mask oxygen  Post-op Assessment: Report given to RN and Post -op Vital signs reviewed and stable  Post vital signs: Reviewed and stable  Last Vitals:  Vitals Value Taken Time  BP 104/62 12/09/20 1553  Temp    Pulse 75 12/09/20 1555  Resp 17 12/09/20 1555  SpO2 100 % 12/09/20 1555  Vitals shown include unvalidated device data.  Last Pain:  Vitals:   12/09/20 1156  TempSrc: Oral  PainSc:       Patients Stated Pain Goal: 3 (37/90/24 0973)  Complications: No complications documented.

## 2020-12-10 DIAGNOSIS — W19XXXA Unspecified fall, initial encounter: Secondary | ICD-10-CM | POA: Diagnosis not present

## 2020-12-10 DIAGNOSIS — S72002A Fracture of unspecified part of neck of left femur, initial encounter for closed fracture: Secondary | ICD-10-CM | POA: Diagnosis not present

## 2020-12-10 LAB — CBC WITH DIFFERENTIAL/PLATELET
Abs Immature Granulocytes: 0.07 10*3/uL (ref 0.00–0.07)
Basophils Absolute: 0 10*3/uL (ref 0.0–0.1)
Basophils Relative: 0 %
Eosinophils Absolute: 0 10*3/uL (ref 0.0–0.5)
Eosinophils Relative: 0 %
HCT: 38 % (ref 36.0–46.0)
Hemoglobin: 12.3 g/dL (ref 12.0–15.0)
Immature Granulocytes: 1 %
Lymphocytes Relative: 11 %
Lymphs Abs: 1.6 10*3/uL (ref 0.7–4.0)
MCH: 33.2 pg (ref 26.0–34.0)
MCHC: 32.4 g/dL (ref 30.0–36.0)
MCV: 102.4 fL — ABNORMAL HIGH (ref 80.0–100.0)
Monocytes Absolute: 1.1 10*3/uL — ABNORMAL HIGH (ref 0.1–1.0)
Monocytes Relative: 7 %
Neutro Abs: 12.3 10*3/uL — ABNORMAL HIGH (ref 1.7–7.7)
Neutrophils Relative %: 81 %
Platelets: 185 10*3/uL (ref 150–400)
RBC: 3.71 MIL/uL — ABNORMAL LOW (ref 3.87–5.11)
RDW: 11.9 % (ref 11.5–15.5)
WBC: 15.1 10*3/uL — ABNORMAL HIGH (ref 4.0–10.5)
nRBC: 0 % (ref 0.0–0.2)

## 2020-12-10 LAB — COMPREHENSIVE METABOLIC PANEL
ALT: 28 U/L (ref 0–44)
AST: 35 U/L (ref 15–41)
Albumin: 3.1 g/dL — ABNORMAL LOW (ref 3.5–5.0)
Alkaline Phosphatase: 56 U/L (ref 38–126)
Anion gap: 8 (ref 5–15)
BUN: 15 mg/dL (ref 8–23)
CO2: 26 mmol/L (ref 22–32)
Calcium: 8.9 mg/dL (ref 8.9–10.3)
Chloride: 106 mmol/L (ref 98–111)
Creatinine, Ser: 0.97 mg/dL (ref 0.44–1.00)
GFR, Estimated: >60 mL/min (ref >60)
Glucose, Bld: 136 mg/dL — ABNORMAL HIGH (ref 70–99)
Potassium: 4.7 mmol/L (ref 3.5–5.1)
Sodium: 140 mmol/L (ref 135–145)
Total Bilirubin: 0.3 mg/dL (ref 0.3–1.2)
Total Protein: 6 g/dL — ABNORMAL LOW (ref 6.5–8.1)

## 2020-12-10 LAB — PHOSPHORUS: Phosphorus: 3.5 mg/dL (ref 2.5–4.6)

## 2020-12-10 NOTE — Evaluation (Signed)
Physical Therapy Evaluation Patient Details Name: Deborah Dougherty MRN: [id number] DOB: [birth year] Today's Date: 12/10/2020   History of Present Illness  Pt is a 75 y.o. female with a displaced Left femoral neck fracture s/p left direct anterior THA  Clinical Impression  Patient is s/p above surgery resulting in functional limitations due to the deficits listed below (see PT Problem List).  Patient will benefit from skilled PT to increase their independence and safety with mobility to allow discharge to the venue listed below.  Pt assisted with ambulating in hallway and overall min assist for mobility today.  Pt hopeful for d/c home and working on finding someone to stay with her since she lives alone.  Pt very pleasant and cooperative.  Will continue to assist with mobility in acute care in preparation for d/c.     Follow Up Recommendations Home health PT;Supervision/Assistance - 24 hour    Equipment Recommendations  Rolling walker with 5" wheels;3in1 (PT)    Recommendations for Other Services       Precautions / Restrictions Precautions Precautions: None;Fall Precaution Comments: direct anterior approach Restrictions LLE Weight Bearing: Weight bearing as tolerated      Mobility  Bed Mobility Overal bed mobility: Needs Assistance Bed Mobility: Supine to Sit     Supine to sit: HOB elevated;Min assist     General bed mobility comments: verbal cues for technique; pt self assisted left LE due to weakness and pain, therapist provided min assist for LE over EOB    Transfers Overall transfer level: Needs assistance Equipment used: Rolling walker (2 wheeled) Transfers: Sit to/from Stand Sit to Stand: Min assist         General transfer comment: verbal cues for UE and LE positioning  Ambulation/Gait Ambulation/Gait assistance: Min guard Gait Distance (Feet): 160 Feet Assistive device: Rolling walker (2 wheeled) Gait Pattern/deviations: Step-to pattern;Decreased stance  time - left;Antalgic Gait velocity: decr   General Gait Details: verbal cues for sequence, RW positioning, posture, step length  Stairs            Wheelchair Mobility    Modified Rankin (Stroke Patients Only)       Balance                                             Pertinent Vitals/Pain Pain Assessment: 0-10 Pain Score: 7  Pain Location: left hip/lateral thigh Pain Descriptors / Indicators: Sore Pain Intervention(s): Monitored during session;Repositioned;Patient requesting pain meds-RN notified    Home Living Family/patient expects to be discharged to:: Private residence Living Arrangements: Alone   Type of Home: House Home Access: Stairs to enter Entrance Stairs-Rails: Right Entrance Stairs-Number of Steps: 2 Home Layout: One level Home Equipment: None      Prior Function Level of Independence: Independent               Hand Dominance        Extremity/Trunk Assessment        Lower Extremity Assessment Lower Extremity Assessment: LLE deficits/detail LLE Deficits / Details: able to perform ankle pumps, anticipated post op hip weakness, pt self assisting LE with bed mobility due to pain       Communication   Communication: No difficulties  Cognition Arousal/Alertness: Awake/alert Behavior During Therapy: WFL for tasks assessed/performed Overall Cognitive Status: Within Functional Limits for tasks assessed  General Comments      Exercises     Assessment/Plan    PT Assessment Patient needs continued PT services  PT Problem List Decreased strength;Decreased mobility;Decreased balance;Decreased knowledge of use of DME;Pain;Decreased activity tolerance       PT Treatment Interventions Stair training;Gait training;DME instruction;Therapeutic exercise;Balance training;Functional mobility training;Therapeutic activities;Patient/family education    PT Goals (Current  goals can be found in the Care Plan section)  Acute Rehab PT Goals PT Goal Formulation: With patient Time For Goal Achievement: 12/24/20 Potential to Achieve Goals: Good    Frequency Min 5X/week   Barriers to discharge        Co-evaluation               AM-PAC PT "6 Clicks" Mobility  Outcome Measure Help needed turning from your back to your side while in a flat bed without using bedrails?: A Little Help needed moving from lying on your back to sitting on the side of a flat bed without using bedrails?: A Little Help needed moving to and from a bed to a chair (including a wheelchair)?: A Little Help needed standing up from a chair using your arms (e.g., wheelchair or bedside chair)?: A Little Help needed to walk in hospital room?: A Little Help needed climbing 3-5 steps with a railing? : A Lot 6 Click Score: 17    End of Session Equipment Utilized During Treatment: Gait belt Activity Tolerance: Patient tolerated treatment well Patient left: in chair;with call bell/phone within reach;with chair alarm set Nurse Communication: Mobility status;Patient requests pain meds PT Visit Diagnosis: Other abnormalities of gait and mobility (R26.89)    Time: 6314-9702 PT Time Calculation (min) (ACUTE ONLY): 28 min   Charges:   PT Evaluation $PT Eval Low Complexity: 1 Low PT Treatments $Gait Training: 8-22 mins   Jannette Spanner PT, DPT Acute Rehabilitation Services Pager: [phone] Office: [phone]  York Ram E 12/10/2020, 12:30 PM

## 2020-12-10 NOTE — Progress Notes (Signed)
PROGRESS NOTE    Deborah Dougherty  OEV:[id number] DOB: [birth year] DOA: 12/07/2020 PCP: Burnard Bunting, MD     Brief Narrative:  Deborah Dougherty is a 75 y.o. WF PMHx Anxiety, Depression.  Urinary retention  Presenting with left hip pain. She reports that she was walking her dog last night when she got tripped up and fell to the sidewalk. It was a mechanical fall d/t her dog. There was no head injury or LOC. She fell on her left side. She remembers the entire fall. She was initially able to get up and walk about a block, but then she had to stop d/t pain. She screamed for help, and some time later, help arrived. She was transported to the ED for assistance. She denies any other aggravating or alleviating factors.    ED Course: She was found to have a left hip fracture. Orthopedics was consulted. [facility] was called for admission.    Subjective: 4/2 afebrile overnight/O x4, some left hip pain.  States ambulated down hallway.   Assessment & Plan: Covid vaccination; vaccinated 3/3   Active Problems:   Hip fracture ([place])   Left hip fracture -4/2 patient's left hip appropriately tender negative sign of infection.   -Encourage patient to continue to ice hip especially after working with physical therapy -Treatment per orthopedic surgery       Anxiety -Continue home meds  Hypokalemia -Resolved   Leukocytosis -Afebrile, negative bands, negative left shift.  Most likely reactive to hip fracture  Macrocytosis -B12, folate pending  Urinary retention - bladder scan showed 680cc; no hx of urinary retention; place foley, check UA -4/1 UA unremarkable.   DVT prophylaxis: Lovenox Code Status: Full Family Communication:  Status is: Inpatient    Dispo: The patient is from: Home              Anticipated d/c is to: SNF?              Anticipated d/c date is: Per orthopedic surgery              Patient currently unstable      Consultants:  [facility]   Procedures/Significant Events:    I have personally reviewed and interpreted all radiology studies and my findings are as above.  VENTILATOR SETTINGS:    Cultures   Antimicrobials:    Devices    LINES / TUBES:      Continuous Infusions:    Objective: Vitals:   12/09/20 2245 12/10/20 0507 12/10/20 0842 12/10/20 1134  BP: 127/63 121/64 108/71 122/73  Pulse: 74 65 75 83  Resp: 16 16 (!) 22 (!) 21  Temp: 98.6 F (37 C) 97.9 F (36.6 C) 98.4 F (36.9 C) 98.4 F (36.9 C)  TempSrc: Oral Oral Oral Oral  SpO2: 94% 96% 94% 94%  Weight:      Height:        Intake/Output Summary (Last 24 hours) at 12/10/2020 1226 Last data filed at 12/10/2020 8299 Gross per 24 hour  Intake 1740 ml  Output 850 ml  Net 890 ml   Filed Weights   12/07/20 2241 12/09/20 1145  Weight: 73 kg 73 kg    Physical Exam:  General: A/O x4, No acute respiratory distress, sitting in chair comfortably Eyes: negative scleral hemorrhage, negative anisocoria, negative icterus ENT: Negative Runny nose, negative gingival bleeding, Neck:  Negative scars, masses, torticollis, lymphadenopathy, JVD Lungs: Clear to auscultation bilaterally without wheezes or crackles Cardiovascular: Regular rate and rhythm without  murmur gallop or rub normal S1 and S2 Abdomen: negative abdominal pain, nondistended, positive soft, bowel sounds, no rebound, no ascites, no appreciable mass Extremities: left thigh swollen appropriately post surgery.  Dressing in place negative sign of bleeding or discharge. Skin: Negative rashes, lesions, ulcers Psychiatric:  Negative depression, negative anxiety, negative fatigue, negative mania  Central nervous system:  Cranial nerves II through XII intact, tongue/uvula midline, all extremities muscle strength 5/5, sensation intact throughout, negative dysarthria, negative expressive aphasia, negative receptive aphasia. .     Data Reviewed: Care during the described time  interval was provided by me .  I have reviewed this patient's available data, including medical history, events of note, physical examination, and all test results as part of my evaluation.  CBC: Recent Labs  Lab 12/08/20 0022 12/09/20 0439 12/10/20 0525  WBC 15.3* 9.3 15.1*  NEUTROABS 11.2*  --  12.3*  HGB 13.2 12.7 12.3  HCT 39.8 39.1 38.0  MCV 102.8* 103.2* 102.4*  PLT 214 185 948   Basic Metabolic Panel: Recent Labs  Lab 12/08/20 0022 12/08/20 1023 12/09/20 0439 12/10/20 0525  NA 141  --  138 140  K 3.3*  --  3.9 4.7  CL 110  --  104 106  CO2 24  --  27 26  GLUCOSE 107*  --  200* 136*  BUN 12  --  8 15  CREATININE 0.66  --  0.77 0.97  CALCIUM 8.1*  --  8.8* 8.9  MG  --  1.8  --   --   PHOS  --   --   --  3.5   GFR: Estimated Creatinine Clearance: 49.5 mL/min (by C-G formula based on SCr of 0.97 mg/dL). Liver Function Tests: Recent Labs  Lab 12/10/20 0525  AST 35  ALT 28  ALKPHOS 56  BILITOT 0.3  PROT 6.0*  ALBUMIN 3.1*   No results for input(s): LIPASE, AMYLASE in the last 168 hours. No results for input(s): AMMONIA in the last 168 hours. Coagulation Profile: Recent Labs  Lab 12/08/20 0022  INR 1.0   Cardiac Enzymes: No results for input(s): CKTOTAL, CKMB, CKMBINDEX, TROPONINI in the last 168 hours. BNP (last 3 results) No results for input(s): PROBNP in the last 8760 hours. HbA1C: No results for input(s): HGBA1C in the last 72 hours. CBG: No results for input(s): GLUCAP in the last 168 hours. Lipid Profile: No results for input(s): CHOL, HDL, LDLCALC, TRIG, CHOLHDL, LDLDIRECT in the last 72 hours. Thyroid Function Tests: No results for input(s): TSH, T4TOTAL, FREET4, T3FREE, THYROIDAB in the last 72 hours. Anemia Panel: Recent Labs    12/08/20 1023  VITAMINB12 106*  FOLATE 13.2   Sepsis Labs: No results for input(s): PROCALCITON, LATICACIDVEN in the last 168 hours.  Recent Results (from the past 240 hour(s))  Resp Panel by RT-PCR (Flu  A&B, Covid) Nasopharyngeal Swab     Status: None   Collection Time: 12/08/20  1:32 AM   Specimen: Nasopharyngeal Swab; Nasopharyngeal(NP) swabs in vial transport medium  Result Value Ref Range Status   SARS Coronavirus 2 by RT PCR NEGATIVE NEGATIVE Final    Comment: (NOTE) SARS-CoV-2 target nucleic acids are NOT DETECTED.  The SARS-CoV-2 RNA is generally detectable in upper respiratory specimens during the acute phase of infection. The lowest concentration of SARS-CoV-2 viral copies this assay can detect is 138 copies/mL. A negative result does not preclude SARS-Cov-2 infection and should not be used as the sole basis for treatment or other patient management  decisions. A negative result may occur with  improper specimen collection/handling, submission of specimen other than nasopharyngeal swab, presence of viral mutation(s) within the areas targeted by this assay, and inadequate number of viral copies(<138 copies/mL). A negative result must be combined with clinical observations, patient history, and epidemiological information. The expected result is Negative.  Fact Sheet for Patients:  EntrepreneurPulse.com.au  Fact Sheet for Healthcare Providers:  IncredibleEmployment.be  This test is no t yet approved or cleared by the [place] FDA and  has been authorized for detection and/or diagnosis of SARS-CoV-2 by FDA under an Emergency Use Authorization (EUA). This EUA will remain  in effect (meaning this test can be used) for the duration of the COVID-19 declaration under Section 564(b)(1) of the Act, 21 U.S.C.section 360bbb-3(b)(1), unless the authorization is terminated  or revoked sooner.       Influenza A by PCR NEGATIVE NEGATIVE Final   Influenza B by PCR NEGATIVE NEGATIVE Final    Comment: (NOTE) The Xpert Xpress SARS-CoV-2/FLU/RSV plus assay is intended as an aid in the diagnosis of influenza from Nasopharyngeal swab specimens  and should not be used as a sole basis for treatment. Nasal washings and aspirates are unacceptable for Xpert Xpress SARS-CoV-2/FLU/RSV testing.  Fact Sheet for Patients: EntrepreneurPulse.com.au  Fact Sheet for Healthcare Providers: IncredibleEmployment.be  This test is not yet approved or cleared by the [place] FDA and has been authorized for detection and/or diagnosis of SARS-CoV-2 by FDA under an Emergency Use Authorization (EUA). This EUA will remain in effect (meaning this test can be used) for the duration of the COVID-19 declaration under Section 564(b)(1) of the Act, 21 U.S.C. section 360bbb-3(b)(1), unless the authorization is terminated or revoked.  Performed at [facility], [place] [address] [place], [place] [address]   Surgical PCR screen     Status: None   Collection Time: 12/08/20  6:24 PM   Specimen: Nasal Mucosa; Nasal Swab  Result Value Ref Range Status   MRSA, PCR NEGATIVE NEGATIVE Final   Staphylococcus aureus NEGATIVE NEGATIVE Final    Comment: (NOTE) The Xpert SA Assay (FDA approved for NASAL specimens in patients 47 years of age and older), is one component of a comprehensive surveillance program. It is not intended to diagnose infection nor to guide or monitor treatment. Performed at [facility], [place] [address] [place],  [address]          Radiology Studies: Pelvis Portable  Result Date: 12/09/2020 CLINICAL DATA:  75 year old female status post left hip replacement. EXAM: PORTABLE PELVIS 1-2 VIEWS COMPARISON:  None. FINDINGS: There is a total left hip arthroplasty. The arthroplasty components appear intact and in anatomic alignment. No acute fracture or dislocation. The bones are osteopenic. Degenerative changes of the lower lumbar spine. Postoperative changes and soft tissue air in the left hip. IMPRESSION: Status post total left hip arthroplasty. Electronically  Signed   By: Anner Crete M.D.   On: 12/09/2020 18:02   DG C-Arm 1-60 Min-No Report  Result Date: 12/09/2020 CLINICAL DATA:  Left hip replacement. EXAM: OPERATIVE left HIP (WITH PELVIS IF PERFORMED) 8 VIEWS TECHNIQUE: Fluoroscopic spot image(s) were submitted for interpretation post-operatively. Radiation exposure index: 1.0945 mGy. COMPARISON:  December 07, 2020. FINDINGS: Eight intraoperative fluoroscopic images were obtained of the left hip. The left femoral and acetabular components appear to be well situated. IMPRESSION: Fluoroscopic guidance provided during left total hip arthroplasty. Electronically Signed   By: Marijo Conception M.D.   On: 12/09/2020  15:36   DG HIP OPERATIVE UNILAT W OR W/O PELVIS LEFT  Result Date: 12/09/2020 CLINICAL DATA:  Left hip replacement. EXAM: OPERATIVE left HIP (WITH PELVIS IF PERFORMED) 8 VIEWS TECHNIQUE: Fluoroscopic spot image(s) were submitted for interpretation post-operatively. Radiation exposure index: 1.0945 mGy. COMPARISON:  December 07, 2020. FINDINGS: Eight intraoperative fluoroscopic images were obtained of the left hip. The left femoral and acetabular components appear to be well situated. IMPRESSION: Fluoroscopic guidance provided during left total hip arthroplasty. Electronically Signed   By: Marijo Conception M.D.   On: 12/09/2020 15:36        Scheduled Meds: . aspirin EC  325 mg Oral Q breakfast  . calcium carbonate  1,250 mg Oral Daily  . Chlorhexidine Gluconate Cloth  6 each Topical Daily  . cholecalciferol  2,000 Units Oral Daily  . docusate sodium  100 mg Oral BID  . PARoxetine  20 mg Oral Daily  . potassium chloride  20 mEq Oral BID   Continuous Infusions:    LOS: 2 days    Time spent:0 min    Lavonta Tillis, Geraldo Docker, MD [facility]   If 7PM-7AM, please contact night-coverage 12/10/2020, 12:26 PM

## 2020-12-10 NOTE — Progress Notes (Signed)
Physical Therapy Treatment Patient Details Name: Deborah Dougherty MRN: [id number] DOB: 04/08/1946 Today's Date: 12/10/2020    History of Present Illness Pt is a 75 y.o. female with a displaced Left femoral neck fracture s/p left direct anterior THA    PT Comments    Pt assisted to bathroom and then ambulated again in hallway.  Pt anticipating d/c home and still looking for someone to assist her upon d/c.    Follow Up Recommendations  Home health PT;Supervision/Assistance - 24 hour     Equipment Recommendations  Rolling walker with 5" wheels;3in1 (PT)    Recommendations for Other Services       Precautions / Restrictions Precautions Precautions: None;Fall Precaution Comments: direct anterior approach Restrictions LLE Weight Bearing: Weight bearing as tolerated    Mobility  Bed Mobility Overal bed mobility: Needs Assistance Bed Mobility: Supine to Sit;Sit to Supine     Supine to sit: Min guard Sit to supine: Min guard   General bed mobility comments: pt able to self assist Lt LE    Transfers Overall transfer level: Needs assistance Equipment used: Rolling walker (2 wheeled) Transfers: Sit to/from Stand Sit to Stand: Min guard         General transfer comment: verbal cues for UE and LE positioning  Ambulation/Gait Ambulation/Gait assistance: Min guard Gait Distance (Feet): 160 Feet Assistive device: Rolling walker (2 wheeled) Gait Pattern/deviations: Step-to pattern;Decreased stance time - left;Antalgic Gait velocity: decr   General Gait Details: verbal cues for sequence, RW positioning, posture, step length   Stairs             Wheelchair Mobility    Modified Rankin (Stroke Patients Only)       Balance                                            Cognition Arousal/Alertness: Awake/alert Behavior During Therapy: WFL for tasks assessed/performed Overall Cognitive Status: Within Functional Limits for tasks assessed                                         Exercises      General Comments        Pertinent Vitals/Pain Pain Assessment: 0-10 Pain Score: 5  Pain Location: left hip/lateral thigh Pain Descriptors / Indicators: Sore Pain Intervention(s): Repositioned;Ice applied;Monitored during session    Home Living                      Prior Function            PT Goals (current goals can now be found in the care plan section) Acute Rehab PT Goals PT Goal Formulation: With patient Time For Goal Achievement: 12/24/20 Potential to Achieve Goals: Good Progress towards PT goals: Progressing toward goals    Frequency    Min 5X/week      PT Plan Current plan remains appropriate    Co-evaluation              AM-PAC PT "6 Clicks" Mobility   Outcome Measure  Help needed turning from your back to your side while in a flat bed without using bedrails?: A Little Help needed moving from lying on your back to sitting on the side of a flat bed without using bedrails?:  A Little Help needed moving to and from a bed to a chair (including a wheelchair)?: A Little Help needed standing up from a chair using your arms (e.g., wheelchair or bedside chair)?: A Little Help needed to walk in hospital room?: A Little Help needed climbing 3-5 steps with a railing? : A Lot 6 Click Score: 17    End of Session Equipment Utilized During Treatment: Gait belt Activity Tolerance: Patient tolerated treatment well Patient left: in bed;with bed alarm set;with call bell/phone within reach Nurse Communication: Mobility status;Patient requests pain meds PT Visit Diagnosis: Other abnormalities of gait and mobility (R26.89)     Time: 6047-9987 PT Time Calculation (min) (ACUTE ONLY): 21 min  Charges:  $Gait Training: 8-22 mins                    Arlyce Dice, DPT Acute Rehabilitation Services Pager: [phone] Office: [phone]  Trena Platt 12/10/2020, 3:24 PM

## 2020-12-10 NOTE — Progress Notes (Signed)
Orthopedics Progress Note  Subjective: Patient feeling better this morning with no hip pain. She is looking forward to getting up and walking  Objective:  Vitals:   12/09/20 2245 12/10/20 0507  BP: 127/63 121/64  Pulse: 74 65  Resp: 16 16  Temp: 98.6 F (37 C) 97.9 F (36.6 C)  SpO2: 94% 96%    General: Awake and alert  Musculoskeletal: Left hip dressing CDI Neurovascularly intact  Lab Results  Component Value Date   WBC 15.1 (H) 12/10/2020   HGB 12.3 12/10/2020   HCT 38.0 12/10/2020   MCV 102.4 (H) 12/10/2020   PLT 185 12/10/2020       Component Value Date/Time   NA 140 12/10/2020 0525   K 4.7 12/10/2020 0525   CL 106 12/10/2020 0525   CO2 26 12/10/2020 0525   GLUCOSE 136 (H) 12/10/2020 0525   BUN 15 12/10/2020 0525   CREATININE 0.97 12/10/2020 0525   CALCIUM 8.9 12/10/2020 0525   GFRNONAA >60 12/10/2020 0525   GFRAA >90 01/25/2012 1840    Lab Results  Component Value Date   INR 1.0 12/08/2020    Assessment/Plan: POD #1 s/p Procedure(s): TOTAL HIP ARTHROPLASTY ANTERIOR APPROACH Mobilization and therapy and discharge planning.  Patient has some support but lives alone  [place] R. Veverly Fells, MD 12/10/2020 7:58 AM

## 2020-12-11 DIAGNOSIS — S72002A Fracture of unspecified part of neck of left femur, initial encounter for closed fracture: Secondary | ICD-10-CM | POA: Diagnosis not present

## 2020-12-11 DIAGNOSIS — W19XXXA Unspecified fall, initial encounter: Secondary | ICD-10-CM | POA: Diagnosis not present

## 2020-12-11 LAB — CBC WITH DIFFERENTIAL/PLATELET
Abs Immature Granulocytes: 0.04 10*3/uL (ref 0.00–0.07)
Basophils Absolute: 0 10*3/uL (ref 0.0–0.1)
Basophils Relative: 0 %
Eosinophils Absolute: 0.1 10*3/uL (ref 0.0–0.5)
Eosinophils Relative: 1 %
HCT: 34.4 % — ABNORMAL LOW (ref 36.0–46.0)
Hemoglobin: 11.2 g/dL — ABNORMAL LOW (ref 12.0–15.0)
Immature Granulocytes: 0 %
Lymphocytes Relative: 25 %
Lymphs Abs: 3 10*3/uL (ref 0.7–4.0)
MCH: 33.5 pg (ref 26.0–34.0)
MCHC: 32.6 g/dL (ref 30.0–36.0)
MCV: 103 fL — ABNORMAL HIGH (ref 80.0–100.0)
Monocytes Absolute: 1.1 10*3/uL — ABNORMAL HIGH (ref 0.1–1.0)
Monocytes Relative: 9 %
Neutro Abs: 7.7 10*3/uL (ref 1.7–7.7)
Neutrophils Relative %: 65 %
Platelets: 182 10*3/uL (ref 150–400)
RBC: 3.34 MIL/uL — ABNORMAL LOW (ref 3.87–5.11)
RDW: 12.4 % (ref 11.5–15.5)
WBC: 11.9 10*3/uL — ABNORMAL HIGH (ref 4.0–10.5)
nRBC: 0 % (ref 0.0–0.2)

## 2020-12-11 LAB — COMPREHENSIVE METABOLIC PANEL
ALT: 55 U/L — ABNORMAL HIGH (ref 0–44)
AST: 64 U/L — ABNORMAL HIGH (ref 15–41)
Albumin: 2.9 g/dL — ABNORMAL LOW (ref 3.5–5.0)
Alkaline Phosphatase: 60 U/L (ref 38–126)
Anion gap: 7 (ref 5–15)
BUN: 17 mg/dL (ref 8–23)
CO2: 29 mmol/L (ref 22–32)
Calcium: 8.8 mg/dL — ABNORMAL LOW (ref 8.9–10.3)
Chloride: 102 mmol/L (ref 98–111)
Creatinine, Ser: 0.7 mg/dL (ref 0.44–1.00)
GFR, Estimated: >60 mL/min (ref >60)
Glucose, Bld: 114 mg/dL — ABNORMAL HIGH (ref 70–99)
Potassium: 4.5 mmol/L (ref 3.5–5.1)
Sodium: 138 mmol/L (ref 135–145)
Total Bilirubin: 0.4 mg/dL (ref 0.3–1.2)
Total Protein: 5.7 g/dL — ABNORMAL LOW (ref 6.5–8.1)

## 2020-12-11 LAB — PHOSPHORUS: Phosphorus: 3.4 mg/dL (ref 2.5–4.6)

## 2020-12-11 MED ORDER — VITAMIN B-12 1000 MCG PO TABS
1000.0000 ug | ORAL_TABLET | Freq: Every day | ORAL | Status: DC
  Administered 2020-12-11 – 2020-12-14 (×4): 1000 ug via ORAL
  Filled 2020-12-11 (×4): qty 1

## 2020-12-11 NOTE — Progress Notes (Signed)
Physical Therapy Treatment Patient Details Name: Deborah Dougherty MRN: [id number] DOB: 31-Jan-1946 Today's Date: 12/11/2020    History of Present Illness Pt is a 75 y.o. female with a displaced Left femoral neck fracture s/p left direct anterior THA    PT Comments    Pt progressing well. Will see again to reviewed HEP. Pt is sorting out assist at home from family. Continue to recommend HHPT at d/c. Will continue to follow in acute setting   Follow Up Recommendations  Home health PT;Supervision/Assistance - 24 hour     Equipment Recommendations  Rolling walker with 5" wheels;3in1 (PT)    Recommendations for Other Services       Precautions / Restrictions Precautions Precautions: None;Fall Precaution Comments: direct anterior approach Restrictions Weight Bearing Restrictions: No Other Position/Activity Restrictions: WBAT    Mobility  Bed Mobility Overal bed mobility: Needs Assistance Bed Mobility: Supine to Sit;Sit to Supine     Supine to sit: Supervision;Min guard Sit to supine: Supervision;Min guard   General bed mobility comments: pt able to self assist Lt LE using gait belt as leg lifter    Transfers Overall transfer level: Needs assistance Equipment used: Rolling walker (2 wheeled) Transfers: Sit to/from Stand Sit to Stand: Min guard         General transfer comment: verbal cues for UE and LE positioning  Ambulation/Gait Ambulation/Gait assistance: Min guard Gait Distance (Feet): 80 Feet Assistive device: Rolling walker (2 wheeled) Gait Pattern/deviations: Step-to pattern;Decreased stance time - left;Antalgic Gait velocity: decr   General Gait Details: verbal cues for sequence, RW positioning, posture, step length   Stairs             Wheelchair Mobility    Modified Rankin (Stroke Patients Only)       Balance                                            Cognition Arousal/Alertness: Awake/alert Behavior During Therapy:  WFL for tasks assessed/performed Overall Cognitive Status: Within Functional Limits for tasks assessed                                        Exercises      General Comments        Pertinent Vitals/Pain Pain Assessment: 0-10 Pain Score: 6  Pain Location: left hip/lateral thigh Pain Descriptors / Indicators: Discomfort;Grimacing;Guarding;Sore Pain Intervention(s): Limited activity within patient's tolerance;Monitored during session;Repositioned;Ice applied;Other (comment) (encouraged pt to call for meds if needed)    Home Living                      Prior Function            PT Goals (current goals can now be found in the care plan section) Acute Rehab PT Goals PT Goal Formulation: With patient Time For Goal Achievement: 12/24/20 Potential to Achieve Goals: Good Progress towards PT goals: Progressing toward goals    Frequency    Min 5X/week      PT Plan Current plan remains appropriate    Co-evaluation              AM-PAC PT "6 Clicks" Mobility   Outcome Measure  Help needed turning from your back to your side while in a flat bed  without using bedrails?: A Little Help needed moving from lying on your back to sitting on the side of a flat bed without using bedrails?: A Little Help needed moving to and from a bed to a chair (including a wheelchair)?: A Little Help needed standing up from a chair using your arms (e.g., wheelchair or bedside chair)?: A Little Help needed to walk in hospital room?: A Little Help needed climbing 3-5 steps with a railing? : A Lot 6 Click Score: 17    End of Session Equipment Utilized During Treatment: Gait belt Activity Tolerance: Patient tolerated treatment well Patient left: in bed;with bed alarm set;with call bell/phone within reach Nurse Communication: Mobility status PT Visit Diagnosis: Other abnormalities of gait and mobility (R26.89)     Time: 4888-9169 PT Time Calculation (min) (ACUTE  ONLY): 19 min  Charges:  $Gait Training: 8-22 mins                     Baxter Flattery, PT  Acute Rehab Dept ([place]) [phone] Pager [phone]  12/11/2020    [facility] 12/11/2020, 1:36 PM

## 2020-12-11 NOTE — Progress Notes (Signed)
PROGRESS NOTE    Deborah Dougherty  WCB:[id number] DOB: 1945/09/30 DOA: 12/07/2020 PCP: Burnard Bunting, MD     Brief Narrative:  Deborah Dougherty is a 75 y.o. WF PMHx Anxiety, Depression.  Urinary retention  Presenting with left hip pain. She reports that she was walking her dog last night when she got tripped up and fell to the sidewalk. It was a mechanical fall d/t her dog. There was no head injury or LOC. She fell on her left side. She remembers the entire fall. She was initially able to get up and walk about a block, but then she had to stop d/t pain. She screamed for help, and some time later, help arrived. She was transported to the ED for assistance. She denies any other aggravating or alleviating factors.    ED Course: She was found to have a left hip fracture. Orthopedics was consulted. [facility] was called for admission.    Subjective: 4/3 afebrile overnight A/O x4, left hip appropriately painful for having ambulated partially down hallway today.  Patient states that she is working on having family and friends stay with her in order to meet the 24 hour/day supervision/assistance.  Will be in place tomorrow.   Assessment & Plan: Covid vaccination; vaccinated 3/3   Active Problems:   Hip fracture ([place])   Left hip fracture -4/2 patient's left hip appropriately tender negative sign of infection.   -Encourage patient to continue to ice hip especially after working with physical therapy -4/3 per orthopedic surgery patient not safe for discharge. - 4/3 ADDENDUM; now does not feel safe being discharged home prefers SNF placement.   -4/3 LCSW consult; patient does not feel safe being discharged home.  Patient lives alone request SNF placement  Anxiety -Paxil 20 mg daily  Hypokalemia -Resolved   Leukocytosis -Afebrile, negative bands, negative left shift.  Most likely reactive to hip fracture  Macrocytosis -Vitamin B12 deficient. -4/3 vitamin B12 1000 mcg daily, recheck  vitamin B12/folate in 4 weeks. -Folate WNL   Urinary retention - bladder scan showed 680cc; no hx of urinary retention; place foley, check UA -4/1 UA unremarkable.  Goals of care -4/2 PT; recommends home health PT; supervision/assistance 24-hour -4/3 ADDENDUM; now does not feel safe being discharged home prefers SNF placement.     DVT prophylaxis: Lovenox Code Status: Full Family Communication:  Status is: Inpatient    Dispo: The patient is from: Home              Anticipated d/c is to: Awaiting SNF placement              Anticipated d/c date is: Per orthopedic surgery              Patient currently stable      Consultants:  [facility] orthopedic surgery   Procedures/Significant Events:    I have personally reviewed and interpreted all radiology studies and my findings are as above.  VENTILATOR SETTINGS:    Cultures   Antimicrobials:    Devices    LINES / TUBES:      Continuous Infusions:    Objective: Vitals:   12/10/20 1134 12/10/20 1625 12/10/20 2120 12/11/20 0501  BP: 122/73 124/73 139/65 116/63  Pulse: 83 79 62 74  Resp: (!) 21 (!) 24 16 16   Temp: 98.4 F (36.9 C) (!) 97.4 F (36.3 C) 98.3 F (36.8 C) 99.1 F (37.3 C)  TempSrc: Oral Oral Oral Oral  SpO2: 94% 94% 99% 95%  Weight:  Height:        Intake/Output Summary (Last 24 hours) at 12/11/2020 1109 Last data filed at 12/11/2020 0911 Gross per 24 hour  Intake 120 ml  Output 1500 ml  Net -1380 ml   Filed Weights   12/07/20 2241 12/09/20 1145  Weight: 73 kg 73 kg    Physical Exam:  General: A/O x4, No acute respiratory distress, sitting in chair comfortably Eyes: negative scleral hemorrhage, negative anisocoria, negative icterus ENT: Negative Runny nose, negative gingival bleeding, Neck:  Negative scars, masses, torticollis, lymphadenopathy, JVD Lungs: Clear to auscultation bilaterally without wheezes or crackles Cardiovascular: Regular rate and rhythm without murmur  gallop or rub normal S1 and S2 Abdomen: negative abdominal pain, nondistended, positive soft, bowel sounds, no rebound, no ascites, no appreciable mass Extremities: left thigh swollen appropriately post surgery.  Dressing in place negative sign of bleeding or discharge. Skin: Negative rashes, lesions, ulcers Psychiatric:  Negative depression, negative anxiety, negative fatigue, negative mania  Central nervous system:  Cranial nerves II through XII intact, tongue/uvula midline, all extremities muscle strength 5/5, sensation intact throughout, negative dysarthria, negative expressive aphasia, negative receptive aphasia. .     Data Reviewed: Care during the described time interval was provided by me .  I have reviewed this patient's available data, including medical history, events of note, physical examination, and all test results as part of my evaluation.  CBC: Recent Labs  Lab 12/08/20 0022 12/09/20 0439 12/10/20 0525 12/11/20 0440  WBC 15.3* 9.3 15.1* 11.9*  NEUTROABS 11.2*  --  12.3* 7.7  HGB 13.2 12.7 12.3 11.2*  HCT 39.8 39.1 38.0 34.4*  MCV 102.8* 103.2* 102.4* 103.0*  PLT 214 185 185 465   Basic Metabolic Panel: Recent Labs  Lab 12/08/20 0022 12/08/20 1023 12/09/20 0439 12/10/20 0525 12/11/20 0440  NA 141  --  138 140 138  K 3.3*  --  3.9 4.7 4.5  CL 110  --  104 106 102  CO2 24  --  27 26 29   GLUCOSE 107*  --  200* 136* 114*  BUN 12  --  8 15 17   CREATININE 0.66  --  0.77 0.97 0.70  CALCIUM 8.1*  --  8.8* 8.9 8.8*  MG  --  1.8  --   --   --   PHOS  --   --   --  3.5 3.4   GFR: Estimated Creatinine Clearance: 60 mL/min (by C-G formula based on SCr of 0.7 mg/dL). Liver Function Tests: Recent Labs  Lab 12/10/20 0525 12/11/20 0440  AST 35 64*  ALT 28 55*  ALKPHOS 56 60  BILITOT 0.3 0.4  PROT 6.0* 5.7*  ALBUMIN 3.1* 2.9*   No results for input(s): LIPASE, AMYLASE in the last 168 hours. No results for input(s): AMMONIA in the last 168 hours. Coagulation  Profile: Recent Labs  Lab 12/08/20 0022  INR 1.0   Cardiac Enzymes: No results for input(s): CKTOTAL, CKMB, CKMBINDEX, TROPONINI in the last 168 hours. BNP (last 3 results) No results for input(s): PROBNP in the last 8760 hours. HbA1C: No results for input(s): HGBA1C in the last 72 hours. CBG: No results for input(s): GLUCAP in the last 168 hours. Lipid Profile: No results for input(s): CHOL, HDL, LDLCALC, TRIG, CHOLHDL, LDLDIRECT in the last 72 hours. Thyroid Function Tests: No results for input(s): TSH, T4TOTAL, FREET4, T3FREE, THYROIDAB in the last 72 hours. Anemia Panel: No results for input(s): VITAMINB12, FOLATE, FERRITIN, TIBC, IRON, RETICCTPCT in the last 72 hours.  Sepsis Labs: No results for input(s): PROCALCITON, LATICACIDVEN in the last 168 hours.  Recent Results (from the past 240 hour(s))  Resp Panel by RT-PCR (Flu A&B, Covid) Nasopharyngeal Swab     Status: None   Collection Time: 12/08/20  1:32 AM   Specimen: Nasopharyngeal Swab; Nasopharyngeal(NP) swabs in vial transport medium  Result Value Ref Range Status   SARS Coronavirus 2 by RT PCR NEGATIVE NEGATIVE Final    Comment: (NOTE) SARS-CoV-2 target nucleic acids are NOT DETECTED.  The SARS-CoV-2 RNA is generally detectable in upper respiratory specimens during the acute phase of infection. The lowest concentration of SARS-CoV-2 viral copies this assay can detect is 138 copies/mL. A negative result does not preclude SARS-Cov-2 infection and should not be used as the sole basis for treatment or other patient management decisions. A negative result may occur with  improper specimen collection/handling, submission of specimen other than nasopharyngeal swab, presence of viral mutation(s) within the areas targeted by this assay, and inadequate number of viral copies(<138 copies/mL). A negative result must be combined with clinical observations, patient history, and epidemiological information. The expected result  is Negative.  Fact Sheet for Patients:  EntrepreneurPulse.com.au  Fact Sheet for Healthcare Providers:  IncredibleEmployment.be  This test is no t yet approved or cleared by the [place] FDA and  has been authorized for detection and/or diagnosis of SARS-CoV-2 by FDA under an Emergency Use Authorization (EUA). This EUA will remain  in effect (meaning this test can be used) for the duration of the COVID-19 declaration under Section 564(b)(1) of the Act, 21 U.S.C.section 360bbb-3(b)(1), unless the authorization is terminated  or revoked sooner.       Influenza A by PCR NEGATIVE NEGATIVE Final   Influenza B by PCR NEGATIVE NEGATIVE Final    Comment: (NOTE) The Xpert Xpress SARS-CoV-2/FLU/RSV plus assay is intended as an aid in the diagnosis of influenza from Nasopharyngeal swab specimens and should not be used as a sole basis for treatment. Nasal washings and aspirates are unacceptable for Xpert Xpress SARS-CoV-2/FLU/RSV testing.  Fact Sheet for Patients: EntrepreneurPulse.com.au  Fact Sheet for Healthcare Providers: IncredibleEmployment.be  This test is not yet approved or cleared by the [place] FDA and has been authorized for detection and/or diagnosis of SARS-CoV-2 by FDA under an Emergency Use Authorization (EUA). This EUA will remain in effect (meaning this test can be used) for the duration of the COVID-19 declaration under Section 564(b)(1) of the Act, 21 U.S.C. section 360bbb-3(b)(1), unless the authorization is terminated or revoked.  Performed at [facility], [place] [address] [place], [place] [address]   Surgical PCR screen     Status: None   Collection Time: 12/08/20  6:24 PM   Specimen: Nasal Mucosa; Nasal Swab  Result Value Ref Range Status   MRSA, PCR NEGATIVE NEGATIVE Final   Staphylococcus aureus NEGATIVE NEGATIVE Final    Comment: (NOTE) The Xpert SA  Assay (FDA approved for NASAL specimens in patients 28 years of age and older), is one component of a comprehensive surveillance program. It is not intended to diagnose infection nor to guide or monitor treatment. Performed at [facility], [place] [address] [place], [place] [address]          Radiology Studies: Pelvis Portable  Result Date: 12/09/2020 CLINICAL DATA:  75 year old female status post left hip replacement. EXAM: PORTABLE PELVIS 1-2 VIEWS COMPARISON:  None. FINDINGS: There is a total left hip arthroplasty. The arthroplasty components appear intact and in anatomic  alignment. No acute fracture or dislocation. The bones are osteopenic. Degenerative changes of the lower lumbar spine. Postoperative changes and soft tissue air in the left hip. IMPRESSION: Status post total left hip arthroplasty. Electronically Signed   By: Anner Crete M.D.   On: 12/09/2020 18:02   DG C-Arm 1-60 Min-No Report  Result Date: 12/09/2020 CLINICAL DATA:  Left hip replacement. EXAM: OPERATIVE left HIP (WITH PELVIS IF PERFORMED) 8 VIEWS TECHNIQUE: Fluoroscopic spot image(s) were submitted for interpretation post-operatively. Radiation exposure index: 1.0945 mGy. COMPARISON:  December 07, 2020. FINDINGS: Eight intraoperative fluoroscopic images were obtained of the left hip. The left femoral and acetabular components appear to be well situated. IMPRESSION: Fluoroscopic guidance provided during left total hip arthroplasty. Electronically Signed   By: Marijo Conception M.D.   On: 12/09/2020 15:36   DG HIP OPERATIVE UNILAT W OR W/O PELVIS LEFT  Result Date: 12/09/2020 CLINICAL DATA:  Left hip replacement. EXAM: OPERATIVE left HIP (WITH PELVIS IF PERFORMED) 8 VIEWS TECHNIQUE: Fluoroscopic spot image(s) were submitted for interpretation post-operatively. Radiation exposure index: 1.0945 mGy. COMPARISON:  December 07, 2020. FINDINGS: Eight intraoperative fluoroscopic images were obtained of the left hip.  The left femoral and acetabular components appear to be well situated. IMPRESSION: Fluoroscopic guidance provided during left total hip arthroplasty. Electronically Signed   By: Marijo Conception M.D.   On: 12/09/2020 15:36        Scheduled Meds: . aspirin EC  325 mg Oral Q breakfast  . calcium carbonate  1,250 mg Oral Daily  . cholecalciferol  2,000 Units Oral Daily  . docusate sodium  100 mg Oral BID  . PARoxetine  20 mg Oral Daily  . potassium chloride  20 mEq Oral BID   Continuous Infusions:    LOS: 3 days    Time spent:0 min    Jennett Tarbell, Geraldo Docker, MD [facility]   If 7PM-7AM, please contact night-coverage 12/11/2020, 11:09 AM

## 2020-12-11 NOTE — Progress Notes (Signed)
   Subjective: 2 Days Post-Op Procedure(s) (LRB): TOTAL HIP ARTHROPLASTY ANTERIOR APPROACH (Left) Patient reports pain as mild.   Patient seen in rounds for Dr. Lyla Glassing.  Patient is well, and has had no acute complaints or problems. No acute events overnight. Denies CP, SHOB. Voiding without difficulty, positive flatus. Ambulated 160+ feet with PT yesterday. She is looking forward to getting home when able.  We will continue therapy today.   Objective: Vital signs in last 24 hours: Temp:  [97.4 F (36.3 C)-99.1 F (37.3 C)] 99.1 F (37.3 C) (04/03 0501) Pulse Rate:  [62-79] 74 (04/03 0501) Resp:  [16-24] 16 (04/03 0501) BP: (116-139)/(63-73) 116/63 (04/03 0501) SpO2:  [94 %-99 %] 95 % (04/03 0501)  Intake/Output from previous day:  Intake/Output Summary (Last 24 hours) at 12/11/2020 1203 Last data filed at 12/11/2020 0911 Gross per 24 hour  Intake 120 ml  Output 1500 ml  Net -1380 ml     Intake/Output this shift: Total I/O In: 120 [P.O.:120] Out: 300 [Urine:300]  Labs: Recent Labs    12/09/20 0439 12/10/20 0525 12/11/20 0440  HGB 12.7 12.3 11.2*   Recent Labs    12/10/20 0525 12/11/20 0440  WBC 15.1* 11.9*  RBC 3.71* 3.34*  HCT 38.0 34.4*  PLT 185 182   Recent Labs    12/10/20 0525 12/11/20 0440  NA 140 138  K 4.7 4.5  CL 106 102  CO2 26 29  BUN 15 17  CREATININE 0.97 0.70  GLUCOSE 136* 114*  CALCIUM 8.9 8.8*   No results for input(s): LABPT, INR in the last 72 hours.  Exam: General - Patient is Alert and Oriented Extremity - Neurologically intact Sensation intact distally Intact pulses distally Dorsiflexion/Plantar flexion intact Dressing - dressing C/D/I Motor Function - intact, moving foot and toes well on exam.   Past Medical History:  Diagnosis Date  . Anxiety   . Depression     Assessment/Plan: 2 Days Post-Op Procedure(s) (LRB): TOTAL HIP ARTHROPLASTY ANTERIOR APPROACH (Left) Active Problems:   Hip fracture ([facility])  Estimated  body mass index is 25.22 kg/m as calculated from the following:   Height as of this encounter: 5\' 7"  (1.702 m).   Weight as of this encounter: 73 kg. Advance diet Up with therapy  DVT Prophylaxis - Aspirin Weight bearing as tolerated.  Hemoglobin stable at 11.2 this AM.   Plan is to go Home after hospital stay. Patient reports she has several brother/sister in laws who will be taking turns staying with her after discharge. She states she may arrange a [place] caregiver privately afterwards for additional support. She ambulated well with PT yesterday. We discussed working with PT again today. If she is meeting her goals, her caregivers are ready to assist at home, and she is medically stable she may discharge today. Otherwise, plan for discharge home tomorrow.   Griffith Citron, PA-C Orthopedic Surgery [phone] 12/11/2020, 12:03 PM

## 2020-12-11 NOTE — Progress Notes (Signed)
Physical Therapy Treatment Patient Details Name: Deborah Dougherty MRN: [id number] DOB: Sep 01, 1946 Today's Date: 12/11/2020    History of Present Illness Pt is a 75 y.o. female with a displaced Left femoral neck fracture s/p left direct anterior THA    PT Comments    Pt reports not feeling as well today. She politely declines OOB/amb this pm d/t fatigue. Completed THA HEP and tolerated well.  D/c plan discussed with pt, she does not have consistent assist at home, feels she will need SNF. Agree given pt's progress this far that she will benefit from SNF to maximize independence and allow safe transition home. Will continue to follow in acute settin  Follow Up Recommendations  SNF     Equipment Recommendations  Rolling walker with 5" wheels;3in1 (PT)    Recommendations for Other Services       Precautions / Restrictions Precautions Precautions: None;Fall Precaution Comments: direct anterior approach Restrictions Weight Bearing Restrictions: No Other Position/Activity Restrictions: WBAT    Mobility  Bed Mobility Overal bed mobility: Needs Assistance Bed Mobility: Supine to Sit;Sit to Supine     Supine to sit: Supervision;Min guard Sit to supine: Supervision;Min guard   General bed mobility comments:  (politely declined amb/OOB)    Transfers Overall transfer level: Needs assistance Equipment used: Rolling walker (2 wheeled) Transfers: Sit to/from Stand Sit to Stand: Min guard         General transfer comment: verbal cues for UE and LE positioning  Ambulation/Gait Ambulation/Gait assistance: Min guard Gait Distance (Feet): 80 Feet Assistive device: Rolling walker (2 wheeled) Gait Pattern/deviations: Step-to pattern;Decreased stance time - left;Antalgic Gait velocity: decr   General Gait Details: verbal cues for sequence, RW positioning, posture, step length   Stairs             Wheelchair Mobility    Modified Rankin (Stroke Patients Only)        Balance                                            Cognition Arousal/Alertness: Awake/alert Behavior During Therapy: WFL for tasks assessed/performed Overall Cognitive Status: Within Functional Limits for tasks assessed                                        Exercises Total Joint Exercises Ankle Circles/Pumps: AROM;Both;20 reps Quad Sets: AROM;Both;Strengthening;5 reps Gluteal Sets: 5 reps;AROM;Both;Strengthening Heel Slides: AAROM;AROM;10 reps;Left Hip ABduction/ADduction: AROM;AAROM;Left;10 reps    General Comments        Pertinent Vitals/Pain Pain Assessment: 0-10 Pain Score: 6  Pain Location: left hip/lateral thigh Pain Descriptors / Indicators: Discomfort;Grimacing;Guarding;Sore Pain Intervention(s): Limited activity within patient's tolerance;Monitored during session;Premedicated before session;Repositioned;Ice applied    Home Living                      Prior Function            PT Goals (current goals can now be found in the care plan section) Acute Rehab PT Goals PT Goal Formulation: With patient Time For Goal Achievement: 12/24/20 Potential to Achieve Goals: Good Progress towards PT goals: Progressing toward goals    Frequency    Min 4X/week      PT Plan Discharge plan needs to be updated;Frequency needs to be updated  Co-evaluation              AM-PAC PT "6 Clicks" Mobility   Outcome Measure  Help needed turning from your back to your side while in a flat bed without using bedrails?: A Little Help needed moving from lying on your back to sitting on the side of a flat bed without using bedrails?: A Little Help needed moving to and from a bed to a chair (including a wheelchair)?: A Little Help needed standing up from a chair using your arms (e.g., wheelchair or bedside chair)?: A Little Help needed to walk in hospital room?: A Little Help needed climbing 3-5 steps with a railing? : A Lot 6  Click Score: 17    End of Session Equipment Utilized During Treatment: Gait belt Activity Tolerance: Patient tolerated treatment well Patient left: in bed;with call bell/phone within reach;with bed alarm set Nurse Communication: Mobility status PT Visit Diagnosis: Other abnormalities of gait and mobility (R26.89)     Time: 8377-9396 PT Time Calculation (min) (ACUTE ONLY): 18 min  Charges:  $Gait Training: 8-22 mins $Therapeutic Exercise: 8-22 mins                     Baxter Flattery, PT  Acute Rehab Dept ([place]) [phone] Pager [phone]  12/11/2020    [facility] 12/11/2020, 4:33 PM

## 2020-12-12 ENCOUNTER — Encounter (HOSPITAL_COMMUNITY): Payer: Self-pay | Admitting: Orthopedic Surgery

## 2020-12-12 DIAGNOSIS — S72002A Fracture of unspecified part of neck of left femur, initial encounter for closed fracture: Secondary | ICD-10-CM | POA: Diagnosis not present

## 2020-12-12 LAB — COMPREHENSIVE METABOLIC PANEL
ALT: 109 U/L — ABNORMAL HIGH (ref 0–44)
AST: 98 U/L — ABNORMAL HIGH (ref 15–41)
Albumin: 3.4 g/dL — ABNORMAL LOW (ref 3.5–5.0)
Alkaline Phosphatase: 77 U/L (ref 38–126)
Anion gap: 10 (ref 5–15)
BUN: 11 mg/dL (ref 8–23)
CO2: 26 mmol/L (ref 22–32)
Calcium: 9.2 mg/dL (ref 8.9–10.3)
Chloride: 102 mmol/L (ref 98–111)
Creatinine, Ser: 0.7 mg/dL (ref 0.44–1.00)
GFR, Estimated: >60 mL/min (ref >60)
Glucose, Bld: 123 mg/dL — ABNORMAL HIGH (ref 70–99)
Potassium: 4.3 mmol/L (ref 3.5–5.1)
Sodium: 138 mmol/L (ref 135–145)
Total Bilirubin: 1.1 mg/dL (ref 0.3–1.2)
Total Protein: 6.7 g/dL (ref 6.5–8.1)

## 2020-12-12 LAB — CBC WITH DIFFERENTIAL/PLATELET
Abs Immature Granulocytes: 0.04 10*3/uL (ref 0.00–0.07)
Basophils Absolute: 0 10*3/uL (ref 0.0–0.1)
Basophils Relative: 0 %
Eosinophils Absolute: 0.1 10*3/uL (ref 0.0–0.5)
Eosinophils Relative: 1 %
HCT: 38.2 % (ref 36.0–46.0)
Hemoglobin: 12.3 g/dL (ref 12.0–15.0)
Immature Granulocytes: 0 %
Lymphocytes Relative: 25 %
Lymphs Abs: 3.4 10*3/uL (ref 0.7–4.0)
MCH: 33.3 pg (ref 26.0–34.0)
MCHC: 32.2 g/dL (ref 30.0–36.0)
MCV: 103.5 fL — ABNORMAL HIGH (ref 80.0–100.0)
Monocytes Absolute: 1.1 10*3/uL — ABNORMAL HIGH (ref 0.1–1.0)
Monocytes Relative: 8 %
Neutro Abs: 9.2 10*3/uL — ABNORMAL HIGH (ref 1.7–7.7)
Neutrophils Relative %: 66 %
Platelets: 210 10*3/uL (ref 150–400)
RBC: 3.69 MIL/uL — ABNORMAL LOW (ref 3.87–5.11)
RDW: 12.5 % (ref 11.5–15.5)
WBC: 13.9 10*3/uL — ABNORMAL HIGH (ref 4.0–10.5)
nRBC: 0 % (ref 0.0–0.2)

## 2020-12-12 LAB — PHOSPHORUS: Phosphorus: 4.2 mg/dL (ref 2.5–4.6)

## 2020-12-12 MED ORDER — ASPIRIN 81 MG PO CHEW: 81.0000 mg | CHEWABLE_TABLET | Freq: Two times a day (BID) | ORAL | 0 refills | Status: AC

## 2020-12-12 MED ORDER — HYDROCODONE-ACETAMINOPHEN 5-325 MG PO TABS: 1.0000 | ORAL_TABLET | ORAL | 0 refills | Status: AC | PRN

## 2020-12-12 NOTE — TOC Progression Note (Addendum)
Transition of Care [facility]) - Progression Note    Patient Details  Name: Deborah Dougherty MRN: [id number] Date of Birth: [birth year]  Transition of Care [facility]) CM/SW Contact  Jalal Rauch, Juliann Pulse, RN Phone Number: 12/12/2020, 3:42 PM  Clinical Narrative:Per patient permission agree to SNF-faxed out for SNF, initiated pasrr, & await bed offers.       Expected Discharge Plan: [place] Barriers to Discharge: Continued Medical Work up  Expected Discharge Plan and Services Expected Discharge Plan: [place]   Discharge Planning Services: CM Consult   Living arrangements for the past 2 months: Single Family Home                                       Social Determinants of Health (SDOH) Interventions    Readmission Risk Interventions No flowsheet data found.

## 2020-12-12 NOTE — Progress Notes (Signed)
PROGRESS NOTE    Deborah Dougherty  HGD:[id number] DOB: 07/01/1946 DOA: 12/07/2020 PCP: Burnard Bunting, MD    Brief Narrative:  Deborah Dougherty is a 75 y.o.  female with past medical history of anxiety, depression, urinary retention presented to the hospital with left hip pain after of mechanical fall.  She did not lose consciousness.  Patient was then brought into the hospital where she was found to have left hip fracture.  Orthopedics was consulted and patient underwent left hip arthroplasty.  Currently, awaiting for skilled nursing facility placement.   Assessment & Plan:   Active Problems:   Hip fracture (HCC)  Left hip fracture Status post mechanical fall.  Seen by orthopedics and underwent a left hip arthroplasty.  Has been seen by physical therapy recommend skilled nursing facility placement on discharge.  Patient lives alone by herself   Anxiety Continue Paxil 20 mg daily  Hypokalemia Latest potassium 4.3.  Leukocytosis Likely reactive and mild.  Monitor  Macrocytosis Folate was normal.  Vitamin B12 level is low.  Continue vitamin B12 on discharge. On  B12 1000 mcg daily, recheck vitamin B12/folate in 4 weeks.  Urinary retention Needed Foley catheter.  Continue to monitor for retention.  Currently off Foley catheter.   DVT prophylaxis: Lovenox subcu  Code Status:  Full  Family Communication:  None  Status is: Inpatient  Remains inpatient appropriate because:IV treatments appropriate due to intensity of illness or inability to take PO and Inpatient level of care appropriate due to severity of illness  Dispo: The patient is from: Home  Anticipated d/c is to: Likely skilled nursing facility  Patient currently is medically stable to d/c.              Difficult to place patient: No   Consultants:  [facility] orthopedic surgery  Subjective: Today, patient was seen and examined at bedside.  Patient currently has mild pain no nausea  vomiting. She was able to urinate by herself.   Objective: Vitals:   12/11/20 0501 12/11/20 1327 12/11/20 2100 12/12/20 0452  BP: 116/63 125/65 126/73 137/76  Pulse: 74 84 77 66  Resp: 16 19 18 20   Temp: 99.1 F (37.3 C) 98.3 F (36.8 C) 98.4 F (36.9 C) 97.7 F (36.5 C)  TempSrc: Oral  Oral Oral  SpO2: 95% 91% 95% 100%  Weight:      Height:        Intake/Output Summary (Last 24 hours) at 12/12/2020 0857 Last data filed at 12/12/2020 9622 Gross per 24 hour  Intake 120 ml  Output 3200 ml  Net -3080 ml   Filed Weights   12/07/20 2241 12/09/20 1145  Weight: 73 kg 73 kg   Body mass index is 25.22 kg/m.   Physical Exam: General:  Average built, not in obvious distress HENT:   No scleral pallor or icterus noted. Oral mucosa is moist.  Chest:  Clear breath sounds.  Diminished breath sounds bilaterally. No crackles or wheezes.  CVS: S1 &S2 heard. No murmur.  Regular rate and rhythm. Abdomen: Soft, nontender, nondistended.  Bowel sounds are heard.   Extremities: No cyanosis, clubbing or edema.  Peripheral pulses are palpable.  Left thigh status post surgery with dressing. Psych: Alert, awake and oriented, normal mood CNS:  No cranial nerve deficits.  Power equal in all extremities.   Skin: Warm and dry.  No rashes noted.   Data Reviewed: I personally reviewed the following labs and radiology studies.    CBC: Recent Labs  Lab 12/08/20 0022 12/09/20 0439 12/10/20 0525 12/11/20 0440 12/12/20 0452  WBC 15.3* 9.3 15.1* 11.9* 13.9*  NEUTROABS 11.2*  --  12.3* 7.7 9.2*  HGB 13.2 12.7 12.3 11.2* 12.3  HCT 39.8 39.1 38.0 34.4* 38.2  MCV 102.8* 103.2* 102.4* 103.0* 103.5*  PLT 214 185 185 182 979   Basic Metabolic Panel: Recent Labs  Lab 12/08/20 0022 12/08/20 1023 12/09/20 0439 12/10/20 0525 12/11/20 0440 12/12/20 0452  NA 141  --  138 140 138 138  K 3.3*  --  3.9 4.7 4.5 4.3  CL 110  --  104 106 102 102  CO2 24  --  27 26 29 26   GLUCOSE 107*  --  200* 136* 114*  123*  BUN 12  --  8 15 17 11   CREATININE 0.66  --  0.77 0.97 0.70 0.70  CALCIUM 8.1*  --  8.8* 8.9 8.8* 9.2  MG  --  1.8  --   --   --   --   PHOS  --   --   --  3.5 3.4 4.2   GFR: Estimated Creatinine Clearance: 60 mL/min (by C-G formula based on SCr of 0.7 mg/dL). Liver Function Tests: Recent Labs  Lab 12/10/20 0525 12/11/20 0440 12/12/20 0452  AST 35 64* 98*  ALT 28 55* 109*  ALKPHOS 56 60 77  BILITOT 0.3 0.4 1.1  PROT 6.0* 5.7* 6.7  ALBUMIN 3.1* 2.9* 3.4*   No results for input(s): LIPASE, AMYLASE in the last 168 hours. No results for input(s): AMMONIA in the last 168 hours. Coagulation Profile: Recent Labs  Lab 12/08/20 0022  INR 1.0   Cardiac Enzymes: No results for input(s): CKTOTAL, CKMB, CKMBINDEX, TROPONINI in the last 168 hours. BNP (last 3 results) No results for input(s): PROBNP in the last 8760 hours. HbA1C: No results for input(s): HGBA1C in the last 72 hours. CBG: No results for input(s): GLUCAP in the last 168 hours. Lipid Profile: No results for input(s): CHOL, HDL, LDLCALC, TRIG, CHOLHDL, LDLDIRECT in the last 72 hours. Thyroid Function Tests: No results for input(s): TSH, T4TOTAL, FREET4, T3FREE, THYROIDAB in the last 72 hours. Anemia Panel: No results for input(s): VITAMINB12, FOLATE, FERRITIN, TIBC, IRON, RETICCTPCT in the last 72 hours. Sepsis Labs: No results for input(s): PROCALCITON, LATICACIDVEN in the last 168 hours.  Recent Results (from the past 240 hour(s))  Resp Panel by RT-PCR (Flu A&B, Covid) Nasopharyngeal Swab     Status: None   Collection Time: 12/08/20  1:32 AM   Specimen: Nasopharyngeal Swab; Nasopharyngeal(NP) swabs in vial transport medium  Result Value Ref Range Status   SARS Coronavirus 2 by RT PCR NEGATIVE NEGATIVE Final    Comment: (NOTE) SARS-CoV-2 target nucleic acids are NOT DETECTED.  The SARS-CoV-2 RNA is generally detectable in upper respiratory specimens during the acute phase of infection. The  lowest concentration of SARS-CoV-2 viral copies this assay can detect is 138 copies/mL. A negative result does not preclude SARS-Cov-2 infection and should not be used as the sole basis for treatment or other patient management decisions. A negative result may occur with  improper specimen collection/handling, submission of specimen other than nasopharyngeal swab, presence of viral mutation(s) within the areas targeted by this assay, and inadequate number of viral copies(<138 copies/mL). A negative result must be combined with clinical observations, patient history, and epidemiological information. The expected result is Negative.  Fact Sheet for Patients:  EntrepreneurPulse.com.au  Fact Sheet for Healthcare Providers:  IncredibleEmployment.be  This test  is no t yet approved or cleared by the [place] and  has been authorized for detection and/or diagnosis of SARS-CoV-2 by FDA under an Emergency Use Authorization (EUA). This EUA will remain  in effect (meaning this test can be used) for the duration of the COVID-19 declaration under Section 564(b)(1) of the Act, 21 U.S.C.section 360bbb-3(b)(1), unless the authorization is terminated  or revoked sooner.       Influenza A by PCR NEGATIVE NEGATIVE Final   Influenza B by PCR NEGATIVE NEGATIVE Final    Comment: (NOTE) The Xpert Xpress SARS-CoV-2/FLU/RSV plus assay is intended as an aid in the diagnosis of influenza from Nasopharyngeal swab specimens and should not be used as a sole basis for treatment. Nasal washings and aspirates are unacceptable for Xpert Xpress SARS-CoV-2/FLU/RSV testing.  Fact Sheet for Patients: EntrepreneurPulse.com.au  Fact Sheet for Healthcare Providers: IncredibleEmployment.be  This test is not yet approved or cleared by the [place] FDA and has been authorized for detection and/or diagnosis of SARS-CoV-2 by FDA under  an Emergency Use Authorization (EUA). This EUA will remain in effect (meaning this test can be used) for the duration of the COVID-19 declaration under Section 564(b)(1) of the Act, 21 U.S.C. section 360bbb-3(b)(1), unless the authorization is terminated or revoked.  Performed at [facility], [place] [address] [place], [place] [address]   Surgical PCR screen     Status: None   Collection Time: 12/08/20  6:24 PM   Specimen: Nasal Mucosa; Nasal Swab  Result Value Ref Range Status   MRSA, PCR NEGATIVE NEGATIVE Final   Staphylococcus aureus NEGATIVE NEGATIVE Final    Comment: (NOTE) The Xpert SA Assay (FDA approved for NASAL specimens in patients 14 years of age and older), is one component of a comprehensive surveillance program. It is not intended to diagnose infection nor to guide or monitor treatment. Performed at [facility], [place] [address], [place], [place] [address]       Radiology Studies: No results found.    Scheduled Meds: . aspirin EC  325 mg Oral Q breakfast  . calcium carbonate  1,250 mg Oral Daily  . cholecalciferol  2,000 Units Oral Daily  . docusate sodium  100 mg Oral BID  . PARoxetine  20 mg Oral Daily  . potassium chloride  20 mEq Oral BID  . vitamin B-12  1,000 mcg Oral Daily   Continuous Infusions:    LOS: 4 days    Flora Lipps, MD [facility] If 7PM-7AM, please contact night-coverage 12/12/2020, 8:57 AM

## 2020-12-12 NOTE — Plan of Care (Signed)
Plan of care discussed with pt.

## 2020-12-12 NOTE — NC FL2 (Signed)
[place] LEVEL OF CARE SCREENING TOOL     IDENTIFICATION  Patient Name: Deborah Dougherty Birthdate: 05/24/1946 Sex: female Admission Date (Current Location): 12/07/2020  [facility] and [place] Number:  Herbalist and Address:  [facility],  [place] [address] [place]      Provider Number: [id number]  Attending Physician Name and Address:  Flora Lipps, MD  Relative Name and Phone Number:  Lorenza Cambridge son [phone]    Current Level of Care: Hospital Recommended Level of Care: [place] Prior Approval Number:    Date Approved/Denied:   PASRR Number:    Discharge Plan: SNF    Current Diagnoses: Patient Active Problem List   Diagnosis Date Noted  . Hip fracture ([place]) 12/08/2020    Orientation RESPIRATION BLADDER Height & Weight     Self,Time,Situation,Place  Normal Continent Weight: 73 kg Height:  5\' 7"  (170.2 cm)  BEHAVIORAL SYMPTOMS/MOOD NEUROLOGICAL BOWEL NUTRITION STATUS      Continent Diet (Regular)  AMBULATORY STATUS COMMUNICATION OF NEEDS Skin   Limited Assist Verbally Surgical wounds (L hip surgical incision site)                       Personal Care Assistance Level of Assistance  Bathing,Feeding,Dressing   Feeding assistance: Limited assistance Dressing Assistance: Limited assistance     Functional Limitations Info  Sight,Hearing,Speech Sight Info: Impaired (eyeglasses) Hearing Info: Adequate Speech Info: Adequate    SPECIAL CARE FACTORS FREQUENCY  PT (By licensed PT),OT (By licensed OT)     PT Frequency:  (5x week) OT Frequency:  (5x week)            Contractures Contractures Info: Not present    Additional Factors Info  Code Status,Allergies,Psychotropic Code Status Info:  (Full) Allergies Info:  (Cefuroxime Axetil) Psychotropic Info:  (paxil-see MAR)         Current Medications (12/12/2020):  This is the current hospital active medication list Current  Facility-Administered Medications  Medication Dose Route Frequency Provider Last Rate Last Admin  . aspirin EC tablet 325 mg  325 mg Oral Q breakfast Rod Can, MD   325 mg at 12/12/20 1003  . calcium carbonate (OS-CAL - dosed in mg of elemental calcium) tablet 1,250 mg  1,250 mg Oral Daily Rod Can, MD   1,250 mg at 12/12/20 1004  . cholecalciferol (VITAMIN D) tablet 2,000 Units  2,000 Units Oral Daily Rod Can, MD   2,000 Units at 12/12/20 1003  . docusate sodium (COLACE) capsule 100 mg  100 mg Oral BID Rod Can, MD   100 mg at 12/12/20 1004  . HYDROcodone-acetaminophen (NORCO/VICODIN) 5-325 MG per tablet 1-2 tablet  1-2 tablet Oral Q4H PRN Rod Can, MD   1 tablet at 12/11/20 1506  . menthol-cetylpyridinium (CEPACOL) lozenge 3 mg  1 lozenge Oral PRN Swinteck, Aaron Edelman, MD       Or  . phenol (CHLORASEPTIC) mouth spray 1 spray  1 spray Mouth/Throat PRN Swinteck, Aaron Edelman, MD      . metoCLOPramide (REGLAN) tablet 5-10 mg  5-10 mg Oral Q8H PRN Swinteck, Aaron Edelman, MD       Or  . metoCLOPramide (REGLAN) injection 5-10 mg  5-10 mg Intravenous Q8H PRN Swinteck, Aaron Edelman, MD      . morphine 2 MG/ML injection 0.5 mg  0.5 mg Intravenous Q2H PRN Swinteck, Aaron Edelman, MD      . ondansetron (ZOFRAN) tablet 4 mg  4 mg Oral  Q6H PRN Rod Can, MD       Or  . ondansetron [facility]) injection 4 mg  4 mg Intravenous Q6H PRN Swinteck, Aaron Edelman, MD      . PARoxetine (PAXIL) tablet 20 mg  20 mg Oral Daily Rod Can, MD   20 mg at 12/12/20 1003  . potassium chloride SA (KLOR-CON) CR tablet 20 mEq  20 mEq Oral BID Rod Can, MD   20 mEq at 12/12/20 1003  . vitamin B-12 (CYANOCOBALAMIN) tablet 1,000 mcg  1,000 mcg Oral Daily Allie Bossier, MD   1,000 mcg at 12/12/20 1004     Discharge Medications: Please see discharge summary for a list of discharge medications.  Relevant Imaging Results:  Relevant Lab Results:   Additional Information SS[address][id number] [place]n Pulse,  [place]

## 2020-12-12 NOTE — Care Management (Signed)
Transition of Care (TOC) -30 day Note       Patient Details   Name:Deborah Dougherty   MCE:[id number]  Date of Birth:1946-01-12     Transition of Care [facility]) CM/SW Contact   Name:Shelvia Fojtik [facility]   Phone [phone]  Date:12/12/2020  Time:3:40p     MUST ES:[id number]     To Whom it May Concern:     Please be advised that the above patient will require a short-term nursing home stay, anticipated 30 days or less rehabilitation and strengthening. The plan is for return home.

## 2020-12-12 NOTE — Progress Notes (Signed)
   Subjective: 3 Days Post-Op Procedure(s) (LRB): TOTAL HIP ARTHROPLASTY ANTERIOR APPROACH (Left) Patient reports pain as mild.   Patient seen in rounds for Dr. Lyla Glassing.  Patient is well, and has had no acute complaints or problems. No acute events overnight. Denies CP, SHOB. Voiding without difficulty, positive flatus. Patient is now planning possible discharge to rehab facility We will continue therapy today.   Objective: Vital signs in last 24 hours: Temp:  [97.7 F (36.5 C)-98.4 F (36.9 C)] 97.7 F (36.5 C) (04/04 0452) Pulse Rate:  [66-84] 66 (04/04 0452) Resp:  [18-20] 20 (04/04 0452) BP: (125-137)/(65-76) 137/76 (04/04 0452) SpO2:  [91 %-100 %] 100 % (04/04 0452)  Intake/Output from previous day:  Intake/Output Summary (Last 24 hours) at 12/12/2020 1154 Last data filed at 12/12/2020 0677 Gross per 24 hour  Intake 120 ml  Output 2900 ml  Net -2780 ml     Intake/Output this shift: Total I/O In: -  Out: 600 [Urine:600]  Labs: Recent Labs    12/10/20 0525 12/11/20 0440 12/12/20 0452  HGB 12.3 11.2* 12.3   Recent Labs    12/11/20 0440 12/12/20 0452  WBC 11.9* 13.9*  RBC 3.34* 3.69*  HCT 34.4* 38.2  PLT 182 210   Recent Labs    12/11/20 0440 12/12/20 0452  NA 138 138  K 4.5 4.3  CL 102 102  CO2 29 26  BUN 17 11  CREATININE 0.70 0.70  GLUCOSE 114* 123*  CALCIUM 8.8* 9.2   No results for input(s): LABPT, INR in the last 72 hours.  Exam: General - Patient is Alert and Oriented Extremity - Neurologically intact Sensation intact distally Intact pulses distally Dorsiflexion/Plantar flexion intact Dressing - dressing C/D/I Motor Function - intact, moving foot and toes well on exam.   Past Medical History:  Diagnosis Date  . Anxiety   . Depression     Assessment/Plan: 3 Days Post-Op Procedure(s) (LRB): TOTAL HIP ARTHROPLASTY ANTERIOR APPROACH (Left) Active Problems:   Hip fracture (HCC)  Estimated body mass index is 25.22 kg/m as  calculated from the following:   Height as of this encounter: 5\' 7"  (1.702 m).   Weight as of this encounter: 73 kg. Advance diet Up with therapy  DVT Prophylaxis - Aspirin Weight bearing as tolerated.  Hemoglobin stable at 11.2 this AM.   Plan is to go Rehab after hospital stay. Discharge pending Cherlynn June, PA-C Orthopedic Surgery  12/12/2020, 11:54 AM

## 2020-12-12 NOTE — Care Management Important Message (Signed)
Important Message  Patient Details IM Letter given to the Patient Name: Deborah Dougherty MRN: [id number] Date of Birth: [birth year]   Medicare Important Message Given:  Yes     Kerin Salen 12/12/2020, 1:26 PM

## 2020-12-12 NOTE — Progress Notes (Addendum)
Physical Therapy Treatment Patient Details Name: Deborah Dougherty MRN: [id number] DOB: 01-20-1946 Today's Date: 12/12/2020    History of Present Illness Pt is a 75 y.o. female with a displaced Left femoral neck fracture s/p left direct anterior THA    PT Comments    Pt is progressing toward goals however fatigues quickly, requiring multiple standing rests during gait.  Pt has decr caregiver support at home and is planning for SNF post acute   Follow Up Recommendations  SNF;     Equipment Recommendations  Rolling walker with 5" wheels;3in1 (PT)    Recommendations for Other Services       Precautions / Restrictions Precautions Precautions: None;Fall Precaution Comments: direct anterior approach Restrictions Weight Bearing Restrictions: No Other Position/Activity Restrictions: WBAT    Mobility  Bed Mobility Overal bed mobility: Needs Assistance Bed Mobility: Supine to Sit;Sit to Supine     Supine to sit: Supervision;Min guard Sit to supine: Min assist   General bed mobility comments: pt able to self assist Lt LE using gait belt as leg lifter to EOB. assis tot lift LLE on to bed    Transfers Overall transfer level: Needs assistance Equipment used: Rolling walker (2 wheeled) Transfers: Sit to/from Stand Sit to Stand: Min guard         General transfer comment: verbal cues for UE and LE positioning  Ambulation/Gait Ambulation/Gait assistance: Min guard Gait Distance (Feet): 80 Feet (x2) Assistive device: Rolling walker (2 wheeled) Gait Pattern/deviations: Step-to pattern;Decreased stance time - left;Antalgic Gait velocity: decr   General Gait Details: verbal cues for sequence, RW positioning, posture, step length. 4 standing rests d/t DOE/incr WOB. VSS stable   Stairs             Wheelchair Mobility    Modified Rankin (Stroke Patients Only)       Balance                                            Cognition Arousal/Alertness:  Awake/alert Behavior During Therapy: WFL for tasks assessed/performed Overall Cognitive Status: Within Functional Limits for tasks assessed                                        Exercises      General Comments        Pertinent Vitals/Pain Pain Assessment: 0-10 Pain Score: 5  Pain Location: left hip/lateral thigh Pain Descriptors / Indicators: Grimacing;Guarding;Sore;Burning Pain Intervention(s): Limited activity within patient's tolerance;Monitored during session;Repositioned    Home Living                      Prior Function            PT Goals (current goals can now be found in the care plan section) Acute Rehab PT Goals Patient Stated Goal: home after rehab PT Goal Formulation: With patient Time For Goal Achievement: 12/24/20 Potential to Achieve Goals: Good Progress towards PT goals: Progressing toward goals    Frequency    Min 5X/week      PT Plan Current plan remains appropriate    Co-evaluation              AM-PAC PT "6 Clicks" Mobility   Outcome Measure  Help needed turning from your back to  your side while in a flat bed without using bedrails?: A Little Help needed moving from lying on your back to sitting on the side of a flat bed without using bedrails?: A Little Help needed moving to and from a bed to a chair (including a wheelchair)?: A Little Help needed standing up from a chair using your arms (e.g., wheelchair or bedside chair)?: A Little Help needed to walk in hospital room?: A Little Help needed climbing 3-5 steps with a railing? : A Lot 6 Click Score: 17    End of Session Equipment Utilized During Treatment: Gait belt Activity Tolerance: Patient tolerated treatment well Patient left: in bed;with bed alarm set;with call bell/phone within reach Nurse Communication: Mobility status PT Visit Diagnosis: Other abnormalities of gait and mobility (R26.89)     Time: 8472-0721 PT Time Calculation (min) (ACUTE  ONLY): 24 min  Charges:  $Gait Training: 8-22 mins                     Baxter Flattery, PT  Acute Rehab Dept ([place]) [phone] Pager [phone]  12/12/2020    [facility] 12/12/2020, 3:31 PM

## 2020-12-13 DIAGNOSIS — S72002A Fracture of unspecified part of neck of left femur, initial encounter for closed fracture: Secondary | ICD-10-CM | POA: Diagnosis not present

## 2020-12-13 LAB — CBC WITH DIFFERENTIAL/PLATELET
Abs Immature Granulocytes: 0.05 10*3/uL (ref 0.00–0.07)
Basophils Absolute: 0 10*3/uL (ref 0.0–0.1)
Basophils Relative: 0 %
Eosinophils Absolute: 0.1 10*3/uL (ref 0.0–0.5)
Eosinophils Relative: 1 %
HCT: 33 % — ABNORMAL LOW (ref 36.0–46.0)
Hemoglobin: 10.8 g/dL — ABNORMAL LOW (ref 12.0–15.0)
Immature Granulocytes: 1 %
Lymphocytes Relative: 29 %
Lymphs Abs: 3 10*3/uL (ref 0.7–4.0)
MCH: 33.8 pg (ref 26.0–34.0)
MCHC: 32.7 g/dL (ref 30.0–36.0)
MCV: 103.1 fL — ABNORMAL HIGH (ref 80.0–100.0)
Monocytes Absolute: 0.8 10*3/uL (ref 0.1–1.0)
Monocytes Relative: 8 %
Neutro Abs: 6.4 10*3/uL (ref 1.7–7.7)
Neutrophils Relative %: 61 %
Platelets: 214 10*3/uL (ref 150–400)
RBC: 3.2 MIL/uL — ABNORMAL LOW (ref 3.87–5.11)
RDW: 12.3 % (ref 11.5–15.5)
WBC: 10.4 10*3/uL (ref 4.0–10.5)
nRBC: 0 % (ref 0.0–0.2)

## 2020-12-13 LAB — COMPREHENSIVE METABOLIC PANEL
ALT: 97 U/L — ABNORMAL HIGH (ref 0–44)
AST: 59 U/L — ABNORMAL HIGH (ref 15–41)
Albumin: 3.1 g/dL — ABNORMAL LOW (ref 3.5–5.0)
Alkaline Phosphatase: 73 U/L (ref 38–126)
Anion gap: 8 (ref 5–15)
BUN: 15 mg/dL (ref 8–23)
CO2: 23 mmol/L (ref 22–32)
Calcium: 8.6 mg/dL — ABNORMAL LOW (ref 8.9–10.3)
Chloride: 107 mmol/L (ref 98–111)
Creatinine, Ser: 0.58 mg/dL (ref 0.44–1.00)
GFR, Estimated: >60 mL/min (ref >60)
Glucose, Bld: 106 mg/dL — ABNORMAL HIGH (ref 70–99)
Potassium: 4 mmol/L (ref 3.5–5.1)
Sodium: 138 mmol/L (ref 135–145)
Total Bilirubin: 1 mg/dL (ref 0.3–1.2)
Total Protein: 6 g/dL — ABNORMAL LOW (ref 6.5–8.1)

## 2020-12-13 LAB — PHOSPHORUS: Phosphorus: 3.1 mg/dL (ref 2.5–4.6)

## 2020-12-13 MED ORDER — POLYETHYLENE GLYCOL 3350 17 G PO PACK
17.0000 g | PACK | Freq: Every day | ORAL | Status: DC
  Administered 2020-12-13: 17 g via ORAL
  Filled 2020-12-13 (×2): qty 1

## 2020-12-13 MED ORDER — SENNA 8.6 MG PO TABS
1.0000 | ORAL_TABLET | Freq: Two times a day (BID) | ORAL | Status: DC
  Administered 2020-12-13: 8.6 mg via ORAL
  Filled 2020-12-13 (×2): qty 1

## 2020-12-13 MED ORDER — ACETAMINOPHEN 325 MG PO TABS
650.0000 mg | ORAL_TABLET | Freq: Four times a day (QID) | ORAL | Status: DC | PRN
  Administered 2020-12-13: 650 mg via ORAL
  Filled 2020-12-13: qty 2

## 2020-12-13 NOTE — Progress Notes (Signed)
Physical Therapy Treatment Patient Details Name: Deborah Dougherty MRN: [id number] DOB: 1946-01-01 Today's Date: 12/13/2020    History of Present Illness Pt is a 75 y.o. female with a displaced Left femoral neck fracture s/p left direct anterior THA    PT Comments    Pt progressing toward goals. decr caregiver support at home. Activity tol improving. Awaiting approval for SNF. Continue PT in acute setting   Follow Up Recommendations  SNF     Equipment Recommendations  Rolling walker with 5" wheels;3in1 (PT)    Recommendations for Other Services       Precautions / Restrictions Precautions Precautions: None;Fall Precaution Comments: direct anterior approach Restrictions LLE Weight Bearing: Weight bearing as tolerated    Mobility  Bed Mobility Overal bed mobility: Needs Assistance Bed Mobility: Supine to Sit     Supine to sit: Supervision     General bed mobility comments: pt able to self assist Lt LE UEs, incr time, supervision for safety    Transfers Overall transfer level: Needs assistance Equipment used: Rolling walker (2 wheeled) Transfers: Sit to/from Stand Sit to Stand: Min guard;Supervision         General transfer comment: verbal cues for UE and LE positioning  Ambulation/Gait Ambulation/Gait assistance: Min guard Gait Distance (Feet): 85 Feet (x2) Assistive device: Rolling walker (2 wheeled) Gait Pattern/deviations: Step-to pattern;Decreased stance time - left;Antalgic     General Gait Details: verbal cues for sequence, RW positioning, posture, step length. 2 standing rests d/t DOE/incr WOB although decr SOB compared to last session. VSS stable   Stairs             Wheelchair Mobility    Modified Rankin (Stroke Patients Only)       Balance                                            Cognition Arousal/Alertness: Awake/alert Behavior During Therapy: WFL for tasks assessed/performed Overall Cognitive Status: Within  Functional Limits for tasks assessed                                        Exercises Total Joint Exercises Ankle Circles/Pumps: AROM;Both;20 reps Quad Sets: AROM;Both;10 reps    General Comments        Pertinent Vitals/Pain Pain Assessment: 0-10 Pain Score: 4  Pain Location: left hip/lateral thigh Pain Descriptors / Indicators: Sore;Burning Pain Intervention(s): Limited activity within patient's tolerance;Monitored during session;Premedicated before session;Repositioned;Ice applied    Home Living                      Prior Function            PT Goals (current goals can now be found in the care plan section) Acute Rehab PT Goals Patient Stated Goal: home after rehab PT Goal Formulation: With patient Time For Goal Achievement: 12/24/20 Potential to Achieve Goals: Good Progress towards PT goals: Progressing toward goals    Frequency    Min 5X/week      PT Plan Current plan remains appropriate    Co-evaluation              AM-PAC PT "6 Clicks" Mobility   Outcome Measure  Help needed turning from your back to your side while in a flat  bed without using bedrails?: A Little Help needed moving from lying on your back to sitting on the side of a flat bed without using bedrails?: A Little Help needed moving to and from a bed to a chair (including a wheelchair)?: A Little Help needed standing up from a chair using your arms (e.g., wheelchair or bedside chair)?: A Little Help needed to walk in hospital room?: A Little Help needed climbing 3-5 steps with a railing? : A Little 6 Click Score: 18    End of Session Equipment Utilized During Treatment: Gait belt Activity Tolerance: Patient tolerated treatment well Patient left: in chair;with call bell/phone within reach;with chair alarm set   PT Visit Diagnosis: Other abnormalities of gait and mobility (R26.89)     Time: 9833-8250 PT Time Calculation (min) (ACUTE ONLY): 23  min  Charges:  $Gait Training: 23-37 mins                     Baxter Flattery, PT  Acute Rehab Dept ([place]) [phone] Pager [phone]  12/13/2020    [facility] 12/13/2020, 11:34 AM

## 2020-12-13 NOTE — TOC Progression Note (Signed)
Transition of Care [facility]) - Progression Note    Patient Details  Name: [name] MRN: [id number] Date of Birth: 04-05-1946  Transition of Care [facility]) CM/SW Contact  Joaquin Courts, RN Phone Number: 12/13/2020, 3:44 PM  Clinical Narrative:    CM followed up with patient, who states she has not yet made up her mind about SNF choice, requests time until tomorrow morning to make final decision.  CM explained need for decision, as well as need to notify Everlene Balls of choice for insurance auth purposes.  Patient verbalized understanding, states will have decision by the morning.  TOC will continue to follow.   Expected Discharge Plan: [place] Barriers to Discharge: Continued Medical Work up  Expected Discharge Plan and Services Expected Discharge Plan: [place]   Discharge Planning Services: CM Consult   Living arrangements for the past 2 months: Single Family Home                                       Social Determinants of Health (SDOH) Interventions    Readmission Risk Interventions No flowsheet data found.

## 2020-12-13 NOTE — TOC Progression Note (Signed)
Transition of Care [facility]) - Progression Note    Patient Details  Name: Deborah Dougherty MRN: [id number] Date of Birth: 1945-09-29  Transition of Care [facility]) CM/SW Contact  Joaquin Courts, RN Phone Number: 12/13/2020, 12:15 PM  Clinical Narrative:    CM spoke with patient and provided bed offers, patient states she needs time to review prior to making choice.  CM will follow up this afternoon for final decision.  [employer] authorization initiated, ref # [id number], requested clinicals faxed.  PASRR complete ([id number]).  MD notified of need for repeat Covid test.    Expected Discharge Plan: [place] Barriers to Discharge: Continued Medical Work up  Expected Discharge Plan and Services Expected Discharge Plan: [place]   Discharge Planning Services: CM Consult   Living arrangements for the past 2 months: Single Family Home                                       Social Determinants of Health (SDOH) Interventions    Readmission Risk Interventions No flowsheet data found.

## 2020-12-13 NOTE — Plan of Care (Signed)
  Problem: Health Behavior/Discharge Planning: Goal: Ability to manage health-related needs will improve Outcome: Progressing   Problem: Clinical Measurements: Goal: Ability to maintain clinical measurements within normal limits will improve Outcome: Progressing Goal: Will remain free from infection Outcome: Progressing Goal: Diagnostic test results will improve Outcome: Progressing Goal: Respiratory complications will improve Outcome: Progressing Goal: Cardiovascular complication will be avoided Outcome: Progressing   Problem: Activity: Goal: Risk for activity intolerance will decrease Outcome: Progressing   Problem: Nutrition: Goal: Adequate nutrition will be maintained Outcome: Progressing   Problem: Coping: Goal: Level of anxiety will decrease Outcome: Progressing   Problem: Elimination: Goal: Will not experience complications related to bowel motility Outcome: Progressing Goal: Will not experience complications related to urinary retention Outcome: Progressing   Problem: Pain Managment: Goal: General experience of comfort will improve Outcome: Progressing   Problem: Safety: Goal: Ability to remain free from injury will improve Outcome: Progressing   Problem: Skin Integrity: Goal: Risk for impaired skin integrity will decrease Outcome: Progressing   Problem: Education: Goal: Knowledge of the prescribed therapeutic regimen will improve Outcome: Progressing Goal: Understanding of discharge needs will improve Outcome: Progressing Goal: Individualized Educational Video(s) Outcome: Progressing   Problem: Activity: Goal: Ability to avoid complications of mobility impairment will improve Outcome: Progressing Goal: Ability to tolerate increased activity will improve Outcome: Progressing   Problem: Clinical Measurements: Goal: Postoperative complications will be avoided or minimized Outcome: Progressing   Problem: Pain Management: Goal: Pain level will  decrease with appropriate interventions Outcome: Progressing   Problem: Skin Integrity: Goal: Will show signs of wound healing Outcome: Progressing   

## 2020-12-13 NOTE — Progress Notes (Signed)
PROGRESS NOTE    Deborah Dougherty  QQP:[id number] DOB: 03-Dec-1945 DOA: 12/07/2020 PCP: Burnard Bunting, MD    Brief Narrative:  Deborah Dougherty is a 75 y.o.  female with past medical history of anxiety, depression, urinary retention presented to the hospital with left hip pain after of mechanical fall.  She did not lose consciousness.  Patient was then brought into the hospital where she was found to have left hip fracture.  Orthopedics was consulted and patient underwent left hip arthroplasty on 12/09/2020.  Currently, awaiting for skilled nursing facility placement.  Assessment & Plan:   Active Problems:   Hip fracture (HCC)  Left hip fracture status post left total hip arthroplasty on 12/09/2020 Status post mechanical fall.  Seen by orthopedics and underwent left hip arthroplasty.  Weightbearing as tolerated.  PT has recommended skilled nursing facility at this time.  Anxiety Continue Paxil 20 mg daily  Hypokalemia Improved after replacement.  Latest potassium of 4.0  Leukocytosis Improved.  Latest WBC of 10.4.  Macrocytosis Folate was normal.  Vitamin B12 level is low.  Continue vitamin B12 on discharge. On  B12 1000 mcg daily, recheck vitamin B12/folate in 4 weeks.  Urinary retention Resolved initially required catheterization.  DVT prophylaxis: Aspirin 325 mg   Code Status:  Full  Family Communication:  I tried to reach the patient's son Mr. [name] on the phone but was unable to reach him.  Status is: Inpatient  Remains inpatient appropriate because needing rehabilitation placement  Dispo: The patient is from: Home  Anticipated d/c is to:  Skilled nursing facility  Patient currently is medically stable to d/c.              Difficult to place patient: No   Consultants:  [facility]  Subjective: Patient was seen and examined at bedside.  States that she could not sleep well and has been having constipation.  Complains  of mild pain in the site.  Does not want to take Norco.   Objective: Vitals:   12/12/20 0452 12/12/20 1344 12/12/20 2155 12/13/20 0406  BP: 137/76 129/77 122/74 123/68  Pulse: 66 79 83 74  Resp: 20 16 18 18   Temp: 97.7 F (36.5 C) 98.5 F (36.9 C) 99.3 F (37.4 C) 98.1 F (36.7 C)  TempSrc: Oral Oral Oral Oral  SpO2: 100% 95% 91% 95%  Weight:      Height:        Intake/Output Summary (Last 24 hours) at 12/13/2020 0957 Last data filed at 12/13/2020 0843 Gross per 24 hour  Intake 120 ml  Output 200 ml  Net -80 ml   Filed Weights   12/07/20 2241 12/09/20 1145  Weight: 73 kg 73 kg   Body mass index is 25.22 kg/m.   Physical Exam: General:  Average built, not in obvious distress HENT:   No scleral pallor or icterus noted. Oral mucosa is moist.  Chest:  Clear breath sounds.  Diminished breath sounds bilaterally. No crackles or wheezes.  CVS: S1 &S2 heard. No murmur.  Regular rate and rhythm. Abdomen: Soft, nontender, nondistended.  Bowel sounds are heard.   Extremities: No cyanosis, clubbing or edema.  Peripheral pulses are palpable.  Left hip status post surgery with dressing. Psych: Alert, awake and oriented, normal mood CNS:  No cranial nerve deficits.  Power equal in all extremities.   Skin: Warm and dry.  No rashes noted.  Status post left hip surgery  Data Reviewed: I have personally reviewed the following  labs and radiology studies.    CBC: Recent Labs  Lab 12/08/20 0022 12/09/20 0439 12/10/20 0525 12/11/20 0440 12/12/20 0452 12/13/20 0527  WBC 15.3* 9.3 15.1* 11.9* 13.9* 10.4  NEUTROABS 11.2*  --  12.3* 7.7 9.2* 6.4  HGB 13.2 12.7 12.3 11.2* 12.3 10.8*  HCT 39.8 39.1 38.0 34.4* 38.2 33.0*  MCV 102.8* 103.2* 102.4* 103.0* 103.5* 103.1*  PLT 214 185 185 182 210 779   Basic Metabolic Panel: Recent Labs  Lab 12/08/20 1023 12/09/20 0439 12/10/20 0525 12/11/20 0440 12/12/20 0452 12/13/20 0527  NA  --  138 140 138 138 138  K  --  3.9 4.7 4.5 4.3 4.0   CL  --  104 106 102 102 107  CO2  --  27 26 29 26 23   GLUCOSE  --  200* 136* 114* 123* 106*  BUN  --  8 15 17 11 15   CREATININE  --  0.77 0.97 0.70 0.70 0.58  CALCIUM  --  8.8* 8.9 8.8* 9.2 8.6*  MG 1.8  --   --   --   --   --   PHOS  --   --  3.5 3.4 4.2 3.1   GFR: Estimated Creatinine Clearance: 60 mL/min (by C-G formula based on SCr of 0.58 mg/dL). Liver Function Tests: Recent Labs  Lab 12/10/20 0525 12/11/20 0440 12/12/20 0452 12/13/20 0527  AST 35 64* 98* 59*  ALT 28 55* 109* 97*  ALKPHOS 56 60 77 73  BILITOT 0.3 0.4 1.1 1.0  PROT 6.0* 5.7* 6.7 6.0*  ALBUMIN 3.1* 2.9* 3.4* 3.1*   No results for input(s): LIPASE, AMYLASE in the last 168 hours. No results for input(s): AMMONIA in the last 168 hours. Coagulation Profile: Recent Labs  Lab 12/08/20 0022  INR 1.0   Cardiac Enzymes: No results for input(s): CKTOTAL, CKMB, CKMBINDEX, TROPONINI in the last 168 hours. BNP (last 3 results) No results for input(s): PROBNP in the last 8760 hours. HbA1C: No results for input(s): HGBA1C in the last 72 hours. CBG: No results for input(s): GLUCAP in the last 168 hours. Lipid Profile: No results for input(s): CHOL, HDL, LDLCALC, TRIG, CHOLHDL, LDLDIRECT in the last 72 hours. Thyroid Function Tests: No results for input(s): TSH, T4TOTAL, FREET4, T3FREE, THYROIDAB in the last 72 hours. Anemia Panel: No results for input(s): VITAMINB12, FOLATE, FERRITIN, TIBC, IRON, RETICCTPCT in the last 72 hours. Sepsis Labs: No results for input(s): PROCALCITON, LATICACIDVEN in the last 168 hours.  Recent Results (from the past 240 hour(s))  Resp Panel by RT-PCR (Flu A&B, Covid) Nasopharyngeal Swab     Status: None   Collection Time: 12/08/20  1:32 AM   Specimen: Nasopharyngeal Swab; Nasopharyngeal(NP) swabs in vial transport medium  Result Value Ref Range Status   SARS Coronavirus 2 by RT PCR NEGATIVE NEGATIVE Final    Comment: (NOTE) SARS-CoV-2 target nucleic acids are NOT  DETECTED.  The SARS-CoV-2 RNA is generally detectable in upper respiratory specimens during the acute phase of infection. The lowest concentration of SARS-CoV-2 viral copies this assay can detect is 138 copies/mL. A negative result does not preclude SARS-Cov-2 infection and should not be used as the sole basis for treatment or other patient management decisions. A negative result may occur with  improper specimen collection/handling, submission of specimen other than nasopharyngeal swab, presence of viral mutation(s) within the areas targeted by this assay, and inadequate number of viral copies(<138 copies/mL). A negative result must be combined with clinical observations, patient history, and  epidemiological information. The expected result is Negative.  Fact Sheet for Patients:  EntrepreneurPulse.com.au  Fact Sheet for Healthcare Providers:  IncredibleEmployment.be  This test is no t yet approved or cleared by the [place] FDA and  has been authorized for detection and/or diagnosis of SARS-CoV-2 by FDA under an Emergency Use Authorization (EUA). This EUA will remain  in effect (meaning this test can be used) for the duration of the COVID-19 declaration under Section 564(b)(1) of the Act, 21 U.S.C.section 360bbb-3(b)(1), unless the authorization is terminated  or revoked sooner.       Influenza A by PCR NEGATIVE NEGATIVE Final   Influenza B by PCR NEGATIVE NEGATIVE Final    Comment: (NOTE) The Xpert Xpress SARS-CoV-2/FLU/RSV plus assay is intended as an aid in the diagnosis of influenza from Nasopharyngeal swab specimens and should not be used as a sole basis for treatment. Nasal washings and aspirates are unacceptable for Xpert Xpress SARS-CoV-2/FLU/RSV testing.  Fact Sheet for Patients: EntrepreneurPulse.com.au  Fact Sheet for Healthcare Providers: IncredibleEmployment.be  This test is not yet  approved or cleared by the [place] FDA and has been authorized for detection and/or diagnosis of SARS-CoV-2 by FDA under an Emergency Use Authorization (EUA). This EUA will remain in effect (meaning this test can be used) for the duration of the COVID-19 declaration under Section 564(b)(1) of the Act, 21 U.S.C. section 360bbb-3(b)(1), unless the authorization is terminated or revoked.  Performed at [facility], [place] [address], [place], [place] [address]   Surgical PCR screen     Status: None   Collection Time: 12/08/20  6:24 PM   Specimen: Nasal Mucosa; Nasal Swab  Result Value Ref Range Status   MRSA, PCR NEGATIVE NEGATIVE Final   Staphylococcus aureus NEGATIVE NEGATIVE Final    Comment: (NOTE) The Xpert SA Assay (FDA approved for NASAL specimens in patients 70 years of age and older), is one component of a comprehensive surveillance program. It is not intended to diagnose infection nor to guide or monitor treatment. Performed at [facility], [place] [address], [place], [place] [address]       Radiology Studies: No results found.    Scheduled Meds: . aspirin EC  325 mg Oral Q breakfast  . calcium carbonate  1,250 mg Oral Daily  . cholecalciferol  2,000 Units Oral Daily  . docusate sodium  100 mg Oral BID  . PARoxetine  20 mg Oral Daily  . potassium chloride  20 mEq Oral BID  . vitamin B-12  1,000 mcg Oral Daily   Continuous Infusions:    LOS: 5 days    Flora Lipps, MD [facility] If 7PM-7AM, please contact night-coverage 12/13/2020, 9:57 AM

## 2020-12-14 DIAGNOSIS — W19XXXA Unspecified fall, initial encounter: Secondary | ICD-10-CM | POA: Diagnosis not present

## 2020-12-14 DIAGNOSIS — R262 Difficulty in walking, not elsewhere classified: Secondary | ICD-10-CM | POA: Diagnosis not present

## 2020-12-14 DIAGNOSIS — M2550 Pain in unspecified joint: Secondary | ICD-10-CM | POA: Diagnosis not present

## 2020-12-14 DIAGNOSIS — I10 Essential (primary) hypertension: Secondary | ICD-10-CM | POA: Diagnosis not present

## 2020-12-14 DIAGNOSIS — E876 Hypokalemia: Secondary | ICD-10-CM | POA: Diagnosis not present

## 2020-12-14 DIAGNOSIS — S72032D Displaced midcervical fracture of left femur, subsequent encounter for closed fracture with routine healing: Secondary | ICD-10-CM | POA: Diagnosis not present

## 2020-12-14 DIAGNOSIS — S72002D Fracture of unspecified part of neck of left femur, subsequent encounter for closed fracture with routine healing: Secondary | ICD-10-CM | POA: Diagnosis not present

## 2020-12-14 DIAGNOSIS — R21 Rash and other nonspecific skin eruption: Secondary | ICD-10-CM | POA: Diagnosis not present

## 2020-12-14 DIAGNOSIS — R2681 Unsteadiness on feet: Secondary | ICD-10-CM | POA: Diagnosis not present

## 2020-12-14 DIAGNOSIS — Z7401 Bed confinement status: Secondary | ICD-10-CM | POA: Diagnosis not present

## 2020-12-14 DIAGNOSIS — F320 Major depressive disorder, single episode, mild: Secondary | ICD-10-CM | POA: Diagnosis not present

## 2020-12-14 DIAGNOSIS — R339 Retention of urine, unspecified: Secondary | ICD-10-CM | POA: Diagnosis not present

## 2020-12-14 DIAGNOSIS — M6281 Muscle weakness (generalized): Secondary | ICD-10-CM | POA: Diagnosis not present

## 2020-12-14 DIAGNOSIS — Z20822 Contact with and (suspected) exposure to covid-19: Secondary | ICD-10-CM | POA: Diagnosis not present

## 2020-12-14 DIAGNOSIS — Z9181 History of falling: Secondary | ICD-10-CM | POA: Diagnosis not present

## 2020-12-14 DIAGNOSIS — S72002S Fracture of unspecified part of neck of left femur, sequela: Secondary | ICD-10-CM | POA: Diagnosis not present

## 2020-12-14 DIAGNOSIS — S72002A Fracture of unspecified part of neck of left femur, initial encounter for closed fracture: Secondary | ICD-10-CM | POA: Diagnosis not present

## 2020-12-14 DIAGNOSIS — R41841 Cognitive communication deficit: Secondary | ICD-10-CM | POA: Diagnosis not present

## 2020-12-14 DIAGNOSIS — Z96642 Presence of left artificial hip joint: Secondary | ICD-10-CM | POA: Diagnosis not present

## 2020-12-14 LAB — CBC WITH DIFFERENTIAL/PLATELET
Abs Immature Granulocytes: 0.04 10*3/uL (ref 0.00–0.07)
Basophils Absolute: 0 10*3/uL (ref 0.0–0.1)
Basophils Relative: 0 %
Eosinophils Absolute: 0.2 10*3/uL (ref 0.0–0.5)
Eosinophils Relative: 2 %
HCT: 34.9 % — ABNORMAL LOW (ref 36.0–46.0)
Hemoglobin: 11.1 g/dL — ABNORMAL LOW (ref 12.0–15.0)
Immature Granulocytes: 0 %
Lymphocytes Relative: 29 %
Lymphs Abs: 2.6 10*3/uL (ref 0.7–4.0)
MCH: 32.6 pg (ref 26.0–34.0)
MCHC: 31.8 g/dL (ref 30.0–36.0)
MCV: 102.3 fL — ABNORMAL HIGH (ref 80.0–100.0)
Monocytes Absolute: 0.9 10*3/uL (ref 0.1–1.0)
Monocytes Relative: 10 %
Neutro Abs: 5.3 10*3/uL (ref 1.7–7.7)
Neutrophils Relative %: 59 %
Platelets: 231 10*3/uL (ref 150–400)
RBC: 3.41 MIL/uL — ABNORMAL LOW (ref 3.87–5.11)
RDW: 12.2 % (ref 11.5–15.5)
WBC: 9.1 10*3/uL (ref 4.0–10.5)
nRBC: 0 % (ref 0.0–0.2)

## 2020-12-14 LAB — SARS CORONAVIRUS 2 (TAT 6-24 HRS): SARS Coronavirus 2: NEGATIVE

## 2020-12-14 LAB — BASIC METABOLIC PANEL
Anion gap: 8 (ref 5–15)
BUN: 19 mg/dL (ref 8–23)
CO2: 25 mmol/L (ref 22–32)
Calcium: 8.5 mg/dL — ABNORMAL LOW (ref 8.9–10.3)
Chloride: 106 mmol/L (ref 98–111)
Creatinine, Ser: 0.71 mg/dL (ref 0.44–1.00)
GFR, Estimated: >60 mL/min (ref >60)
Glucose, Bld: 102 mg/dL — ABNORMAL HIGH (ref 70–99)
Potassium: 4.2 mmol/L (ref 3.5–5.1)
Sodium: 139 mmol/L (ref 135–145)

## 2020-12-14 LAB — MAGNESIUM: Magnesium: 2.1 mg/dL (ref 1.7–2.4)

## 2020-12-14 MED ORDER — POLYETHYLENE GLYCOL 3350 17 G PO PACK: 17.0000 g | PACK | Freq: Every day | ORAL | 0 refills | Status: DC | PRN

## 2020-12-14 MED ORDER — TRIAMCINOLONE ACETONIDE 0.1 % EX CREA
TOPICAL_CREAM | Freq: Three times a day (TID) | CUTANEOUS | Status: DC
  Filled 2020-12-14: qty 15

## 2020-12-14 MED ORDER — ACETAMINOPHEN 325 MG PO TABS: 650.0000 mg | ORAL_TABLET | Freq: Four times a day (QID) | ORAL | Status: AC | PRN

## 2020-12-14 MED ORDER — MENTHOL 3 MG MT LOZG: 1.0000 | LOZENGE | OROMUCOSAL | Status: DC | PRN

## 2020-12-14 MED ORDER — PREDNISONE 20 MG PO TABS
20.0000 mg | ORAL_TABLET | Freq: Every day | ORAL | Status: DC
  Administered 2020-12-14: 20 mg via ORAL
  Filled 2020-12-14: qty 1

## 2020-12-14 MED ORDER — DIPHENHYDRAMINE HCL 25 MG PO CAPS: 25.0000 mg | ORAL_CAPSULE | Freq: Three times a day (TID) | ORAL | Status: DC | PRN

## 2020-12-14 MED ORDER — PREDNISONE 20 MG PO TABS: 20.0000 mg | ORAL_TABLET | Freq: Every day | ORAL | 0 refills | Status: AC

## 2020-12-14 MED ORDER — DOCUSATE SODIUM 100 MG PO CAPS: 100.0000 mg | ORAL_CAPSULE | Freq: Two times a day (BID) | ORAL | 0 refills | Status: DC | PRN

## 2020-12-14 MED ORDER — TRIAMCINOLONE ACETONIDE 0.1 % EX CREA: TOPICAL_CREAM | Freq: Two times a day (BID) | CUTANEOUS | 0 refills | Status: AC

## 2020-12-14 MED ORDER — CYANOCOBALAMIN 1000 MCG PO TABS: 1000.0000 ug | ORAL_TABLET | Freq: Every day | ORAL | Status: DC

## 2020-12-14 MED ORDER — ONDANSETRON HCL 4 MG PO TABS: 4.0000 mg | ORAL_TABLET | Freq: Four times a day (QID) | ORAL | 0 refills | Status: DC | PRN

## 2020-12-14 NOTE — Progress Notes (Signed)
Pt d/c from [facility] to [facility] via [place]. All pt belongings gathered and with patient.

## 2020-12-14 NOTE — TOC Transition Note (Signed)
Transition of Care [facility]) - CM/SW Discharge Note   Patient Details  Name: Deborah Dougherty MRN: [id number] Date of Birth: 1945/12/14  [facility]) CM/SW Contact:  Ross Ludwig, LCSW Phone Number: 12/14/2020, 1:44 PM   Clinical Narrative:    Insurance has approved patient, and he can discharge today.  Patient to be d/c'ed today to [facility], SNF room.  Patient and family agreeable to plans will transport via ems RN to call report to [phone].  Patient's son is aware that patient is discharging today.  Passar number is [id number] [phone].   Final next level of care: Skilled Nursing Facility Barriers to Discharge: Barriers Resolved   Patient Goals and CMS Choice Patient states their goals for this hospitalization and ongoing recovery are:: To go to SNF for short term rehab, then return back home. CMS Medicare.gov Compare Post Acute Care list provided to:: Patient Represenative (must comment) Choice offered to / list presented to : Adult Children  Discharge Placement PASRR number recieved: 12/08/20            Patient [facility] at[facility] Patient to be transferred to facility by: [facility] Name of family member notified: [name] Patient and family notified of of transfer: 12/14/20  Discharge Plan and Services   Discharge Planning Services: CM Consult                                 Social Determinants of Health (SDOH) Interventions     Readmission Risk Interventions No flowsheet data found.

## 2020-12-14 NOTE — Progress Notes (Signed)
Patient discharging to [facility].  Report called to Olu, RN at facility.  IV removed - WNL. Patient aware and agreeable.  Belongings packed to be transported with patient via [facility].  Patient in NAD at this time.  AVS placed in packet.

## 2020-12-14 NOTE — Progress Notes (Signed)
Physical Therapy Treatment Patient Details Name: Deborah Dougherty MRN: [id number] DOB: 12-28-1945 Today's Date: 12/14/2020    History of Present Illness Pt is a 75 y.o. female with a displaced Left femoral neck fracture s/p left direct anterior THA    PT Comments    Pt assisted with ambulating in hallway and also performed LE exercises.  Pt anticipates d/c to SNF hopefully today.    Follow Up Recommendations  SNF     Equipment Recommendations  Rolling walker with 5" wheels;3in1 (PT)    Recommendations for Other Services       Precautions / Restrictions Precautions Precautions: None;Fall Precaution Comments: direct anterior approach Restrictions LLE Weight Bearing: Weight bearing as tolerated    Mobility  Bed Mobility Overal bed mobility: Needs Assistance Bed Mobility: Supine to Sit     Supine to sit: Supervision     General bed mobility comments: pt able to self assist Lt LE UEs, incr time, supervision for safety    Transfers Overall transfer level: Needs assistance Equipment used: Rolling walker (2 wheeled) Transfers: Sit to/from Stand Sit to Stand: Min guard;Supervision         General transfer comment: verbal cues for UE and LE positioning  Ambulation/Gait Ambulation/Gait assistance: Min guard Gait Distance (Feet): 85 Feet (x2) Assistive device: Rolling walker (2 wheeled) Gait Pattern/deviations: Step-to pattern;Decreased stance time - left;Antalgic Gait velocity: decr   General Gait Details: verbal cues for RW positioning, posture, step length; two standing rest breaks d/t DOE/incr WOB   Stairs             Wheelchair Mobility    Modified Rankin (Stroke Patients Only)       Balance                                            Cognition Arousal/Alertness: Awake/alert Behavior During Therapy: WFL for tasks assessed/performed Overall Cognitive Status: Within Functional Limits for tasks assessed                                         Exercises Total Joint Exercises Ankle Circles/Pumps: AROM;Both;20 reps Quad Sets: AROM;Both;10 reps Hip ABduction/ADduction: AAROM;Left;10 reps Long Arc Quad: AROM;Left;10 reps;Seated Marching in Standing: AROM;Left;10 reps;Seated    General Comments        Pertinent Vitals/Pain Pain Assessment: 0-10 Pain Score: 4  Pain Location: left hip/lateral thigh Pain Descriptors / Indicators: Sore;Aching Pain Intervention(s): Repositioned;Monitored during session    Home Living                      Prior Function            PT Goals (current goals can now be found in the care plan section) Progress towards PT goals: Progressing toward goals    Frequency           PT Plan Current plan remains appropriate    Co-evaluation              AM-PAC PT "6 Clicks" Mobility   Outcome Measure  Help needed turning from your back to your side while in a flat bed without using bedrails?: A Little Help needed moving from lying on your back to sitting on the side of a flat bed without using bedrails?: A  Little Help needed moving to and from a bed to a chair (including a wheelchair)?: A Little Help needed standing up from a chair using your arms (e.g., wheelchair or bedside chair)?: A Little Help needed to walk in hospital room?: A Little Help needed climbing 3-5 steps with a railing? : A Little 6 Click Score: 18    End of Session Equipment Utilized During Treatment: Gait belt Activity Tolerance: Patient tolerated treatment well Patient left: in chair;with call bell/phone within reach;with chair alarm set   PT Visit Diagnosis: Other abnormalities of gait and mobility (R26.89)     Time: 7185-5015 PT Time Calculation (min) (ACUTE ONLY): 19 min  Charges:  $Therapeutic Exercise: 8-22 mins                     Jannette Spanner PT, DPT Acute Rehabilitation Services Pager: [phone] Office: [phone]  [place] Ram E 12/14/2020, 3:45  PM

## 2020-12-14 NOTE — Discharge Summary (Signed)
Physician Discharge Summary  Deborah Dougherty TSV:[id number] DOB: 10-10-1945 DOA: 12/07/2020  PCP: Burnard Bunting, MD  Admit date: 12/07/2020 Discharge date: 12/14/2020  Admitted From: Home  Discharge disposition: Skilled nursing facility  Recommendations for Outpatient Follow-Up:   . Follow up with your primary care provider at the skilled nursing facility in 3 to 5 days . Check CBC, BMP, magnesium in the next visit . Please follow-up with orthopedics Dr. Rod Can in 2 weeks for x-rays for wound checkup. Marland Kitchen Please try to keep off moisture on the back.    Discharge Diagnosis:   Principal Problem:   Hip fracture [facility])   Discharge Condition: Improved.  Diet recommendation: Low sodium, heart healthy.    Wound care: Local hip wound care.  Code status: Full.   History of Present Illness:   Deborah Koranda Redmondis a 75 y.o. female with past medical history of anxiety, depression, urinary retention presented to the hospital with left hip pain after of mechanical fall.  She did not lose consciousness.  Patient was then brought into the hospital where she was found to have left hip fracture.  Orthopedics was consulted and patient underwent left hip arthroplasty on 12/09/2020.    Hospital Course:   Following conditions were addressed during hospitalization as listed below,  Left hip fracture status post left total hip arthroplasty on 12/09/2020 Status post mechanical fall.  Seen by orthopedics and underwent left hip arthroplasty.  Weightbearing as tolerated.  PT has recommended skilled nursing facility at this time.    Patient will need to follow-up with Dr. Lyla Glassing orthopedics in 2 weeks after discharge.  Anxiety Continue Paxil 20 mg daily  Hypokalemia Improved after replacement.  Latest potassium of 4.2  Leukocytosis Improved.  Latest WBC of 9.1  Macrocytosis Folate was normal.  Vitamin B12 level is low.    Plan to continue vitamin B12 on discharge. Currently on B12  -1000 mcg daily, recheck vitamin B12/folate in 4 weeks.  Urinary retention Resolved initially required catheterization.  Currently able to urinate by herself.  Erythematous rash in the back.  Likely contact dermatitis/sweat rash.  Avoid moisture.  Continue triamcinolone cream twice daily, Benadryl as needed, will prescribe prednisone for couple of days.  Disposition.  At this time, patient is stable for disposition to skilled nursing facility  Medical Consultants:    Orthopedics  Procedures:    Left hip arthroplasty on 12/09/2020  Subjective:   Patient was seen and examined at bedside.  Denies any overt pain, nausea, vomiting, fever or chills.  Complains of rash on the back with pruritus.  Discharge Exam:   Vitals:   12/13/20 2051 12/14/20 0537  BP: (!) 142/63 (!) 109/98  Pulse: 77 73  Resp: 14 16  Temp: 98.6 F (37 C) 98.1 F (36.7 C)  SpO2: 97% 97%   Vitals:   12/13/20 0406 12/13/20 1400 12/13/20 2051 12/14/20 0537  BP: 123/68 130/82 (!) 142/63 (!) 109/98  Pulse: 74 84 77 73  Resp: 18 16 14 16   Temp: 98.1 F (36.7 C) 98 F (36.7 C) 98.6 F (37 C) 98.1 F (36.7 C)  TempSrc: Oral  Oral Oral  SpO2: 95% 98% 97% 97%  Weight:      Height:       General: Alert awake, not in obvious distress HENT: pupils equally reacting to light,  No scleral pallor or icterus noted. Oral mucosa is moist.  Chest:  Clear breath sounds.  Diminished breath sounds bilaterally. No crackles or wheezes.  CVS:  S1 &S2 heard. No murmur.  Regular rate and rhythm. Abdomen: Soft, nontender, nondistended.  Bowel sounds are heard.   Extremities: No cyanosis, clubbing or edema.  Peripheral pulses are palpable.  Left hip surgery status post surgery with dressing. Psych: Alert, awake and oriented, normal mood CNS:  No cranial nerve deficits.  Power equal in all extremities.   Skin: Warm and dry.  Erythematous rash noted over the upper back, status post left hip surgery  The results of significant  diagnostics from this hospitalization (including imaging, microbiology, ancillary and laboratory) are listed below for reference.     Diagnostic Studies:   CT Hip Left Wo Contrast  Result Date: 12/08/2020 CLINICAL DATA:  Fall, left hip pain, EXAM: CT OF THE LEFT HIP WITHOUT CONTRAST TECHNIQUE: Multidetector CT imaging of the left hip was performed according to the standard protocol. Multiplanar CT image reconstructions were also generated. COMPARISON:  None. FINDINGS: Bones/Joint/Cartilage There is an acute, minimally impacted, minimally posteriorly angulated subcapital left femoral neck fracture. The left femoral head is still seated within the left acetabulum. Moderate left femoroacetabular degenerative arthritis with asymmetric joint space narrowing and osteophyte formation. Ligaments Suboptimally assessed by CT. Muscles and Tendons Normal muscle bulk. Iliopsoas, gluteal, and hamstring tendons appear intact. Soft tissues Small left hip effusion. Small fat containing left inguinal hernia. Mild sigmoid diverticulosis. IMPRESSION: Acute, minimally impacted, minimally angulated left subcapital femoral neck fracture. Moderate left femoroacetabular degenerative arthritis. Electronically Signed   By: Fidela Salisbury MD   On: 12/08/2020 01:12   DG Knee Complete 4 Views Left  Result Date: 12/07/2020 CLINICAL DATA:  Trip over dog leading to fall.  Left knee pain. EXAM: LEFT KNEE - COMPLETE 4+ VIEW COMPARISON:  None. FINDINGS: No fracture or dislocation. Minimal degenerative changes peripheral spurring, as well as spurring of the tibial spines. There is a small knee joint effusion. Minimal quadriceps tendon enthesophyte. IMPRESSION: 1. No fracture or subluxation of the left knee. 2. Mild osteoarthritis and small joint effusion. Electronically Signed   By: Keith Rake M.D.   On: 12/07/2020 23:46   DG Hip Unilat With Pelvis 2-3 Views Left  Result Date: 12/07/2020 CLINICAL DATA:  Trip over dog leading to  fall.  Left hip pain. EXAM: DG HIP (WITH OR WITHOUT PELVIS) 2-3V LEFT COMPARISON:  None. FINDINGS: Sclerosis involving the left femoral neck with question of cortical step-off laterally. Moderate left hip osteoarthritis with joint space narrowing and acetabular spurring. Enthesopathic change noted about the greater trochanter. The pubic rami are intact. Pubic symphysis and sacroiliac joints are congruent. IMPRESSION: 1. Possible nondisplaced left femoral neck fracture. Recommend further evaluation with CT or MRI. 2. Moderate left hip osteoarthritis. Electronically Signed   By: Keith Rake M.D.   On: 12/07/2020 23:44     Labs:   Basic Metabolic Panel: Recent Labs  Lab 12/08/20 1023 12/09/20 0439 12/10/20 0525 12/11/20 0440 12/12/20 0452 12/13/20 0527 12/14/20 0443  NA  --    < > 140 138 138 138 139  K  --    < > 4.7 4.5 4.3 4.0 4.2  CL  --    < > 106 102 102 107 106  CO2  --    < > 26 29 26 23 25   GLUCOSE  --    < > 136* 114* 123* 106* 102*  BUN  --    < > 15 17 11 15 19   CREATININE  --    < > 0.97 0.70 0.70 0.58 0.71  CALCIUM  --    < >  8.9 8.8* 9.2 8.6* 8.5*  MG 1.8  --   --   --   --   --  2.1  PHOS  --   --  3.5 3.4 4.2 3.1  --    < > = values in this interval not displayed.   GFR Estimated Creatinine Clearance: 60 mL/min (by C-G formula based on SCr of 0.71 mg/dL). Liver Function Tests: Recent Labs  Lab 12/10/20 0525 12/11/20 0440 12/12/20 0452 12/13/20 0527  AST 35 64* 98* 59*  ALT 28 55* 109* 97*  ALKPHOS 56 60 77 73  BILITOT 0.3 0.4 1.1 1.0  PROT 6.0* 5.7* 6.7 6.0*  ALBUMIN 3.1* 2.9* 3.4* 3.1*   No results for input(s): LIPASE, AMYLASE in the last 168 hours. No results for input(s): AMMONIA in the last 168 hours. Coagulation profile Recent Labs  Lab 12/08/20 0022  INR 1.0    CBC: Recent Labs  Lab 12/10/20 0525 12/11/20 0440 12/12/20 0452 12/13/20 0527 12/14/20 0443  WBC 15.1* 11.9* 13.9* 10.4 9.1  NEUTROABS 12.3* 7.7 9.2* 6.4 5.3  HGB 12.3  11.2* 12.3 10.8* 11.1*  HCT 38.0 34.4* 38.2 33.0* 34.9*  MCV 102.4* 103.0* 103.5* 103.1* 102.3*  PLT 185 182 210 214 231   Cardiac Enzymes: No results for input(s): CKTOTAL, CKMB, CKMBINDEX, TROPONINI in the last 168 hours. BNP: Invalid input(s): POCBNP CBG: No results for input(s): GLUCAP in the last 168 hours. D-Dimer No results for input(s): DDIMER in the last 72 hours. Hgb A1c No results for input(s): HGBA1C in the last 72 hours. Lipid Profile No results for input(s): CHOL, HDL, LDLCALC, TRIG, CHOLHDL, LDLDIRECT in the last 72 hours. Thyroid function studies No results for input(s): TSH, T4TOTAL, T3FREE, THYROIDAB in the last 72 hours.  Invalid input(s): FREET3 Anemia work up No results for input(s): VITAMINB12, FOLATE, FERRITIN, TIBC, IRON, RETICCTPCT in the last 72 hours. Microbiology Recent Results (from the past 240 hour(s))  Resp Panel by RT-PCR (Flu A&B, Covid) Nasopharyngeal Swab     Status: None   Collection Time: 12/08/20  1:32 AM   Specimen: Nasopharyngeal Swab; Nasopharyngeal(NP) swabs in vial transport medium  Result Value Ref Range Status   SARS Coronavirus 2 by RT PCR NEGATIVE NEGATIVE Final    Comment: (NOTE) SARS-CoV-2 target nucleic acids are NOT DETECTED.  The SARS-CoV-2 RNA is generally detectable in upper respiratory specimens during the acute phase of infection. The lowest concentration of SARS-CoV-2 viral copies this assay can detect is 138 copies/mL. A negative result does not preclude SARS-Cov-2 infection and should not be used as the sole basis for treatment or other patient management decisions. A negative result may occur with  improper specimen collection/handling, submission of specimen other than nasopharyngeal swab, presence of viral mutation(s) within the areas targeted by this assay, and inadequate number of viral copies(<138 copies/mL). A negative result must be combined with clinical observations, patient history, and  epidemiological information. The expected result is Negative.  Fact Sheet for Patients:  EntrepreneurPulse.com.au  Fact Sheet for Healthcare Providers:  IncredibleEmployment.be  This test is no t yet approved or cleared by the [place] FDA and  has been authorized for detection and/or diagnosis of SARS-CoV-2 by FDA under an Emergency Use Authorization (EUA). This EUA will remain  in effect (meaning this test can be used) for the duration of the COVID-19 declaration under Section 564(b)(1) of the Act, 21 U.S.C.section 360bbb-3(b)(1), unless the authorization is terminated  or revoked sooner.       Influenza A  by PCR NEGATIVE NEGATIVE Final   Influenza B by PCR NEGATIVE NEGATIVE Final    Comment: (NOTE) The Xpert Xpress SARS-CoV-2/FLU/RSV plus assay is intended as an aid in the diagnosis of influenza from Nasopharyngeal swab specimens and should not be used as a sole basis for treatment. Nasal washings and aspirates are unacceptable for Xpert Xpress SARS-CoV-2/FLU/RSV testing.  Fact Sheet for Patients: EntrepreneurPulse.com.au  Fact Sheet for Healthcare Providers: IncredibleEmployment.be  This test is not yet approved or cleared by the [place] FDA and has been authorized for detection and/or diagnosis of SARS-CoV-2 by FDA under an Emergency Use Authorization (EUA). This EUA will remain in effect (meaning this test can be used) for the duration of the COVID-19 declaration under Section 564(b)(1) of the Act, 21 U.S.C. section 360bbb-3(b)(1), unless the authorization is terminated or revoked.  Performed at [facility], [place] [address], [place], [place] [address]   Surgical PCR screen     Status: None   Collection Time: 12/08/20  6:24 PM   Specimen: Nasal Mucosa; Nasal Swab  Result Value Ref Range Status   MRSA, PCR NEGATIVE NEGATIVE Final   Staphylococcus aureus NEGATIVE  NEGATIVE Final    Comment: (NOTE) The Xpert SA Assay (FDA approved for NASAL specimens in patients 57 years of age and older), is one component of a comprehensive surveillance program. It is not intended to diagnose infection nor to guide or monitor treatment. Performed at [facility], [place] [address] [place], [place] [address]   SARS CORONAVIRUS 2 (TAT 6-24 HRS) Nasopharyngeal Nasopharyngeal Swab     Status: None   Collection Time: 12/13/20  3:49 PM   Specimen: Nasopharyngeal Swab  Result Value Ref Range Status   SARS Coronavirus 2 NEGATIVE NEGATIVE Final    Comment: (NOTE) SARS-CoV-2 target nucleic acids are NOT DETECTED.  The SARS-CoV-2 RNA is generally detectable in upper and lower respiratory specimens during the acute phase of infection. Negative results do not preclude SARS-CoV-2 infection, do not rule out co-infections with other pathogens, and should not be used as the sole basis for treatment or other patient management decisions. Negative results must be combined with clinical observations, patient history, and epidemiological information. The expected result is Negative.  Fact Sheet for Patients: SugarRoll.be  Fact Sheet for Healthcare Providers: https://www.woods-mathews.com/  This test is not yet approved or cleared by the [place] FDA and  has been authorized for detection and/or diagnosis of SARS-CoV-2 by FDA under an Emergency Use Authorization (EUA). This EUA will remain  in effect (meaning this test can be used) for the duration of the COVID-19 declaration under Se ction 564(b)(1) of the Act, 21 U.S.C. section 360bbb-3(b)(1), unless the authorization is terminated or revoked sooner.  Performed at [facility], [place] [address], [place], [place] [address]      Discharge Instructions:   Discharge Instructions    Diet general   Complete by: As directed    Discharge instructions    Complete by: As directed    Follow-up with orthopedics Dr. Rod Can in 2 weeks for surgery follow-up.  Follow-up with your primary care provider at the skilled nursing facility in 3 to 5 days.  Check blood work at that time.   Increase activity slowly   Complete by: As directed    No wound care   Complete by: As directed      Allergies as of 12/14/2020      Reactions   Cefuroxime Axetil Nausea And Vomiting  Medication List    STOP taking these medications   ibuprofen 600 MG tablet Commonly known as: ADVIL     TAKE these medications   acetaminophen 325 MG tablet Commonly known as: TYLENOL Take 2 tablets (650 mg total) by mouth every 6 (six) hours as needed for mild pain or headache.   aspirin 81 MG chewable tablet Commonly known as: Aspirin Childrens Chew 1 tablet (81 mg total) by mouth 2 (two) times daily with a meal.   calcium carbonate 1250 (500 Ca) MG chewable tablet Commonly known as: OS-CAL Chew 1 tablet by mouth daily.   CENTRUM PO Take 1 tablet by mouth daily.   cyanocobalamin 1000 MCG tablet Take 1 tablet (1,000 mcg total) by mouth daily.   diphenhydrAMINE 25 mg capsule Commonly known as: BENADRYL Take 1 capsule (25 mg total) by mouth every 8 (eight) hours as needed for up to 5 days for itching or allergies.   docusate sodium 100 MG capsule Commonly known as: COLACE Take 1 capsule (100 mg total) by mouth 2 (two) times daily as needed for mild constipation.   HYDROcodone-acetaminophen 5-325 MG tablet Commonly known as: NORCO/VICODIN Take 1 tablet by mouth every 4 (four) hours as needed for up to 7 days for moderate pain or severe pain.   menthol-cetylpyridinium 3 MG lozenge Commonly known as: CEPACOL Take 1 lozenge (3 mg total) by mouth as needed for sore throat.   ondansetron 4 MG tablet Commonly known as: ZOFRAN Take 1 tablet (4 mg total) by mouth every 6 (six) hours as needed for nausea.   PARoxetine 20 MG tablet Commonly known as:  PAXIL Take 20 mg by mouth daily.   polyethylene glycol 17 g packet Commonly known as: MIRALAX / GLYCOLAX Take 17 g by mouth daily as needed for moderate constipation.   predniSONE 20 MG tablet Commonly known as: DELTASONE Take 1 tablet (20 mg total) by mouth daily with breakfast for 3 days.   triamcinolone 0.1 % Commonly known as: KENALOG Apply topically 2 (two) times daily for 7 days.   Vitamin D3 50 MCG (2000 UT) Tabs Take by mouth daily.       Follow-up Information    Swinteck, Aaron Edelman, MD. Schedule an appointment as soon as possible for a visit in 2 weeks.   Specialty: Orthopedic Surgery Why: For wound re-check Contact information: [address] [place] [address] [phone]                Time coordinating discharge: 39 minutes  Signed:  Jan Olano  [facility] 12/14/2020, 1:14 PM

## 2020-12-14 NOTE — TOC Transition Note (Deleted)
Transition of Care [facility]) - CM/SW Discharge Note   Patient Details  Name: Deborah Dougherty MRN: [id number] Date of Birth: May 04, 1946  Transition of Care [facility]) CM/SW Contact:  Ross Ludwig, LCSW Phone Number: 12/14/2020, 1:39 PM   Clinical Narrative:     Patient to be d/c'ed today to [facility], SNF room.  Patient and family agreeable to plans will transport via ems RN to call report to [phone].  Patient's son is aware that patient is discharging today.  Passar number is [phone].  Final next level of care: Skilled Nursing Facility Barriers to Discharge: Barriers Resolved   Patient Goals and CMS Choice Patient states their goals for this hospitalization and ongoing recovery are:: To go to SNF for short term rehab, then return back home. CMS Medicare.gov Compare Post Acute Care list provided to:: Patient Represenative (must comment) Choice offered to / list presented to : Adult Children  Discharge Placement                       Discharge Plan and Services   Discharge Planning Services: CM Consult                                 Social Determinants of Health (SDOH) Interventions     Readmission Risk Interventions No flowsheet data found.

## 2020-12-16 DIAGNOSIS — Z96642 Presence of left artificial hip joint: Secondary | ICD-10-CM | POA: Diagnosis not present

## 2020-12-16 DIAGNOSIS — W19XXXA Unspecified fall, initial encounter: Secondary | ICD-10-CM | POA: Diagnosis not present

## 2020-12-16 DIAGNOSIS — R262 Difficulty in walking, not elsewhere classified: Secondary | ICD-10-CM | POA: Diagnosis not present

## 2020-12-16 DIAGNOSIS — S72002S Fracture of unspecified part of neck of left femur, sequela: Secondary | ICD-10-CM | POA: Diagnosis not present

## 2020-12-23 DIAGNOSIS — S72002S Fracture of unspecified part of neck of left femur, sequela: Secondary | ICD-10-CM | POA: Diagnosis not present

## 2020-12-23 DIAGNOSIS — R262 Difficulty in walking, not elsewhere classified: Secondary | ICD-10-CM | POA: Diagnosis not present

## 2020-12-23 DIAGNOSIS — Z96642 Presence of left artificial hip joint: Secondary | ICD-10-CM | POA: Diagnosis not present

## 2020-12-23 DIAGNOSIS — F320 Major depressive disorder, single episode, mild: Secondary | ICD-10-CM | POA: Diagnosis not present

## 2020-12-26 DIAGNOSIS — Z96642 Presence of left artificial hip joint: Secondary | ICD-10-CM | POA: Diagnosis not present

## 2020-12-26 DIAGNOSIS — F320 Major depressive disorder, single episode, mild: Secondary | ICD-10-CM | POA: Diagnosis not present

## 2020-12-26 DIAGNOSIS — W19XXXA Unspecified fall, initial encounter: Secondary | ICD-10-CM | POA: Diagnosis not present

## 2020-12-26 DIAGNOSIS — R262 Difficulty in walking, not elsewhere classified: Secondary | ICD-10-CM | POA: Diagnosis not present

## 2020-12-27 DIAGNOSIS — S72032D Displaced midcervical fracture of left femur, subsequent encounter for closed fracture with routine healing: Secondary | ICD-10-CM | POA: Diagnosis not present

## 2020-12-31 DIAGNOSIS — F419 Anxiety disorder, unspecified: Secondary | ICD-10-CM | POA: Diagnosis not present

## 2020-12-31 DIAGNOSIS — Z9181 History of falling: Secondary | ICD-10-CM | POA: Diagnosis not present

## 2020-12-31 DIAGNOSIS — F320 Major depressive disorder, single episode, mild: Secondary | ICD-10-CM | POA: Diagnosis not present

## 2020-12-31 DIAGNOSIS — Z96642 Presence of left artificial hip joint: Secondary | ICD-10-CM | POA: Diagnosis not present

## 2020-12-31 DIAGNOSIS — H9193 Unspecified hearing loss, bilateral: Secondary | ICD-10-CM | POA: Diagnosis not present

## 2020-12-31 DIAGNOSIS — S72002D Fracture of unspecified part of neck of left femur, subsequent encounter for closed fracture with routine healing: Secondary | ICD-10-CM | POA: Diagnosis not present

## 2021-01-02 ENCOUNTER — Emergency Department (HOSPITAL_COMMUNITY)
Admission: EM | Admit: 2021-01-02 | Discharge: 2021-01-02 | Disposition: A | Discharge status: Home / Self Care | Payer: Medicare PPO | Attending: Emergency Medicine | Admitting: Emergency Medicine

## 2021-01-02 ENCOUNTER — Other Ambulatory Visit: Payer: Self-pay

## 2021-01-02 ENCOUNTER — Encounter (HOSPITAL_COMMUNITY): Payer: Self-pay

## 2021-01-02 ENCOUNTER — Emergency Department (HOSPITAL_COMMUNITY): Payer: Medicare PPO

## 2021-01-02 DIAGNOSIS — R519 Headache, unspecified: Secondary | ICD-10-CM | POA: Insufficient documentation

## 2021-01-02 DIAGNOSIS — Z0389 Encounter for observation for other suspected diseases and conditions ruled out: Secondary | ICD-10-CM | POA: Diagnosis not present

## 2021-01-02 DIAGNOSIS — F1010 Alcohol abuse, uncomplicated: Secondary | ICD-10-CM | POA: Insufficient documentation

## 2021-01-02 DIAGNOSIS — R9431 Abnormal electrocardiogram [ECG] [EKG]: Secondary | ICD-10-CM | POA: Diagnosis not present

## 2021-01-02 DIAGNOSIS — Z7982 Long term (current) use of aspirin: Secondary | ICD-10-CM | POA: Diagnosis not present

## 2021-01-02 DIAGNOSIS — Z79899 Other long term (current) drug therapy: Secondary | ICD-10-CM | POA: Diagnosis not present

## 2021-01-02 DIAGNOSIS — F10920 Alcohol use, unspecified with intoxication, uncomplicated: Secondary | ICD-10-CM

## 2021-01-02 DIAGNOSIS — Z96642 Presence of left artificial hip joint: Secondary | ICD-10-CM | POA: Insufficient documentation

## 2021-01-02 DIAGNOSIS — F10929 Alcohol use, unspecified with intoxication, unspecified: Secondary | ICD-10-CM | POA: Diagnosis not present

## 2021-01-02 DIAGNOSIS — Y906 Blood alcohol level of 120-199 mg/100 ml: Secondary | ICD-10-CM | POA: Diagnosis not present

## 2021-01-02 LAB — CBC WITH DIFFERENTIAL/PLATELET
Abs Immature Granulocytes: 0.02 10*3/uL (ref 0.00–0.07)
Basophils Absolute: 0 10*3/uL (ref 0.0–0.1)
Basophils Relative: 0 %
Eosinophils Absolute: 0.1 10*3/uL (ref 0.0–0.5)
Eosinophils Relative: 1 %
HCT: 38.4 % (ref 36.0–46.0)
Hemoglobin: 12.4 g/dL (ref 12.0–15.0)
Immature Granulocytes: 0 %
Lymphocytes Relative: 42 %
Lymphs Abs: 4 10*3/uL (ref 0.7–4.0)
MCH: 32.5 pg (ref 26.0–34.0)
MCHC: 32.3 g/dL (ref 30.0–36.0)
MCV: 100.5 fL — ABNORMAL HIGH (ref 80.0–100.0)
Monocytes Absolute: 0.4 10*3/uL (ref 0.1–1.0)
Monocytes Relative: 4 %
Neutro Abs: 4.9 10*3/uL (ref 1.7–7.7)
Neutrophils Relative %: 53 %
Platelets: 280 10*3/uL (ref 150–400)
RBC: 3.82 MIL/uL — ABNORMAL LOW (ref 3.87–5.11)
RDW: 12.3 % (ref 11.5–15.5)
WBC: 9.4 10*3/uL (ref 4.0–10.5)
nRBC: 0 % (ref 0.0–0.2)

## 2021-01-02 LAB — ETHANOL: Alcohol, Ethyl (B): 163 mg/dL — ABNORMAL HIGH (ref <10)

## 2021-01-02 LAB — COMPREHENSIVE METABOLIC PANEL
ALT: 37 U/L (ref 0–44)
AST: 23 U/L (ref 15–41)
Albumin: 3.6 g/dL (ref 3.5–5.0)
Alkaline Phosphatase: 94 U/L (ref 38–126)
Anion gap: 9 (ref 5–15)
BUN: 13 mg/dL (ref 8–23)
CO2: 24 mmol/L (ref 22–32)
Calcium: 8.6 mg/dL — ABNORMAL LOW (ref 8.9–10.3)
Chloride: 113 mmol/L — ABNORMAL HIGH (ref 98–111)
Creatinine, Ser: 0.7 mg/dL (ref 0.44–1.00)
GFR, Estimated: >60 mL/min (ref >60)
Glucose, Bld: 115 mg/dL — ABNORMAL HIGH (ref 70–99)
Potassium: 3.9 mmol/L (ref 3.5–5.1)
Sodium: 146 mmol/L — ABNORMAL HIGH (ref 135–145)
Total Bilirubin: 0.3 mg/dL (ref 0.3–1.2)
Total Protein: 6.6 g/dL (ref 6.5–8.1)

## 2021-01-02 MED ORDER — SODIUM CHLORIDE 0.9 % IV SOLN: INTRAVENOUS | Status: DC

## 2021-01-02 NOTE — ED Notes (Signed)
Patient transported to CT 

## 2021-01-02 NOTE — ED Provider Notes (Signed)
[facility] DEPT Provider Note   CSN: [id number] Arrival date & time: 01/02/21  1353     History Chief Complaint  Patient presents with  . Altered Mental Status    Deborah Dougherty is a 75 y.o. female.  75 year old female presents with sudden onset of headache and double vision some confusion with resultant falls x1 day.  States that she spoke to her brother-in-law this morning does not member the conversation.  States that she recently had left hip surgery and fell but is unsure how that happened.  Denies any syncope associated with this.  Denies any recent illnesses or new medications.  Does admit to drinking alcohol last night but none this morning.  Says headache is mild and not associate with nausea.  States that her double vision last for several hours but has since returned.  She may have had some slow speech this morning but she was unaware of this.  Patient states she feels at her baseline        Past Medical History:  Diagnosis Date  . Anxiety   . Depression     Patient Active Problem List   Diagnosis Date Noted  . Hip fracture ([place]) 12/08/2020    Past Surgical History:  Procedure Laterality Date  . ABDOMINAL HYSTERECTOMY  1998  . TONSILLECTOMY  1955  . TOTAL HIP ARTHROPLASTY Left 12/09/2020   Procedure: TOTAL HIP ARTHROPLASTY ANTERIOR APPROACH;  Surgeon: Rod Can, MD;  Location: [facility];  Service: Orthopedics;  Laterality: Left;  . WRIST SURGERY  2010     OB History   No obstetric history on file.     Family History  Problem Relation Age of Onset  . Cancer Mother   . Cancer Father     Social History   Tobacco Use  . Smoking status: Never Smoker  . Smokeless tobacco: Never Used  . Tobacco comment: quit 1969  Substance Use Topics  . Alcohol use: Yes    Comment: occasionally, 6 oz daily  . Drug use: Never    Home Medications Prior to Admission medications   Medication Sig Start Date End Date Taking?  Authorizing Provider  acetaminophen (TYLENOL) 325 MG tablet Take 2 tablets (650 mg total) by mouth every 6 (six) hours as needed for mild pain or headache. 12/14/20   Pokhrel, Corrie Mckusick, MD  aspirin (ASPIRIN CHILDRENS) 81 MG chewable tablet Chew 1 tablet (81 mg total) by mouth 2 (two) times daily with a meal. 12/12/20 01/26/21  Cherlynn June B, PA  calcium carbonate (OS-CAL) 1250 (500 Ca) MG chewable tablet Chew 1 tablet by mouth daily.    [provider]  Cholecalciferol (VITAMIN D3) 50 MCG (2000 UT) TABS Take by mouth daily.    [provider]  diphenhydrAMINE (BENADRYL) 25 mg capsule Take 1 capsule (25 mg total) by mouth every 8 (eight) hours as needed for up to 5 days for itching or allergies. 12/14/20 12/19/20  Pokhrel, Corrie Mckusick, MD  docusate sodium (COLACE) 100 MG capsule Take 1 capsule (100 mg total) by mouth 2 (two) times daily as needed for mild constipation. 12/14/20   Pokhrel, Corrie Mckusick, MD  menthol-cetylpyridinium (CEPACOL) 3 MG lozenge Take 1 lozenge (3 mg total) by mouth as needed for sore throat. 12/14/20   Pokhrel, Corrie Mckusick, MD  Multiple Vitamins-Minerals (CENTRUM PO) Take 1 tablet by mouth daily.    [provider]  ondansetron (ZOFRAN) 4 MG tablet Take 1 tablet (4 mg total) by mouth every 6 (six) hours as  needed for nausea. 12/14/20   Pokhrel, Corrie Mckusick, MD  PARoxetine (PAXIL) 20 MG tablet Take 20 mg by mouth daily.    [provider]  polyethylene glycol (MIRALAX / GLYCOLAX) 17 g packet Take 17 g by mouth daily as needed for moderate constipation. 12/14/20   Pokhrel, Corrie Mckusick, MD  vitamin B-12 1000 MCG tablet Take 1 tablet (1,000 mcg total) by mouth daily. 12/14/20   Pokhrel, Corrie Mckusick, MD    Allergies    Cefuroxime axetil  Review of Systems   Review of Systems  All other systems reviewed and are negative.   Physical Exam Updated Vital Signs BP 121/78 (BP Location: Left Arm)   Pulse 97   Temp 99.8 F (37.7 C) (Oral)   Resp 16   Ht 1.702 m (5\' 7" )   Wt 73.5 kg    SpO2 97%   BMI 25.37 kg/m   Physical Exam Vitals and nursing note reviewed.  Constitutional:      General: She is not in acute distress.    Appearance: Normal appearance. She is well-developed. She is not toxic-appearing.  HENT:     Head: Normocephalic and atraumatic.  Eyes:     General: Lids are normal.     Conjunctiva/sclera: Conjunctivae normal.     Pupils: Pupils are equal, round, and reactive to light.  Neck:     Thyroid: No thyroid mass.     Trachea: No tracheal deviation.  Cardiovascular:     Rate and Rhythm: Normal rate and regular rhythm.     Heart sounds: Normal heart sounds. No murmur heard. No gallop.   Pulmonary:     Effort: Pulmonary effort is normal. No respiratory distress.     Breath sounds: Normal breath sounds. No stridor. No decreased breath sounds, wheezing, rhonchi or rales.  Abdominal:     General: Bowel sounds are normal. There is no distension.     Palpations: Abdomen is soft.     Tenderness: There is no abdominal tenderness. There is no rebound.  Musculoskeletal:        General: No tenderness. Normal range of motion.     Cervical back: Normal range of motion and neck supple.  Skin:    General: Skin is warm and dry.     Findings: No abrasion or rash.  Neurological:     General: No focal deficit present.     Mental Status: She is alert and oriented to person, place, and time.     GCS: GCS eye subscore is 4. GCS verbal subscore is 5. GCS motor subscore is 6.     Cranial Nerves: Cranial nerves are intact. No cranial nerve deficit.     Sensory: No sensory deficit.     Motor: Motor function is intact.     Coordination: Coordination is intact.  Psychiatric:        Attention and Perception: Attention normal.        Mood and Affect: Mood normal.        Speech: Speech normal.        Behavior: Behavior normal.     ED Results / Procedures / Treatments   Labs (all labs ordered are listed, but only abnormal results are displayed) Labs Reviewed   ETHANOL  RAPID URINE DRUG SCREEN, HOSP PERFORMED  CBC WITH DIFFERENTIAL/PLATELET  COMPREHENSIVE METABOLIC PANEL  URINALYSIS, ROUTINE W REFLEX MICROSCOPIC    EKG EKG Interpretation  Date/Time:  Monday January 02 2021 14:38:54 EDT Ventricular Rate:  98 PR Interval:  152 QRS Duration:  101 QT Interval:  353 QTC Calculation: 451 R Axis:   79 Text Interpretation: Sinus rhythm Low voltage, precordial leads Borderline repol abnormality, diffuse leads Baseline wander in lead(s) V2 12 Lead; Mason-Likar No significant change since last tracing Confirmed by Lacretia Leigh ([id number]) on 01/02/2021 5:00:23 PM   Radiology No results found.  Procedures Procedures   Medications Ordered in ED Medications  0.9 %  sodium chloride infusion (has no administration in time range)    ED Course  I have reviewed the triage vital signs and the nursing notes.  Pertinent labs & imaging results that were available during my care of the patient were reviewed by me and considered in my medical decision making (see chart for details).    MDM Rules/Calculators/A&P                         Patient's alcohol level is 163.  Head CT was normal.  Suspect alcohol intoxication as cause of her symptoms.  Will discharge  Final Clinical Impression(s) / ED Diagnoses Final diagnoses:  None    Rx / DC Orders ED Discharge Orders    None       Lacretia Leigh, MD 01/02/21 1744

## 2021-01-02 NOTE — ED Triage Notes (Signed)
Patient states family states she has abnormal speech that is slow at 0830 and family says she feel walking while they was at house and she doesn't recall this. Patient states at lunch she started having double vision and headache. Patient states she fell while trying to get dressed to come to hospital.

## 2021-01-02 NOTE — ED Notes (Signed)
Patient provided with water per request 

## 2021-01-03 DIAGNOSIS — F320 Major depressive disorder, single episode, mild: Secondary | ICD-10-CM | POA: Diagnosis not present

## 2021-01-03 DIAGNOSIS — Z9181 History of falling: Secondary | ICD-10-CM | POA: Diagnosis not present

## 2021-01-03 DIAGNOSIS — H9193 Unspecified hearing loss, bilateral: Secondary | ICD-10-CM | POA: Diagnosis not present

## 2021-01-03 DIAGNOSIS — Z96642 Presence of left artificial hip joint: Secondary | ICD-10-CM | POA: Diagnosis not present

## 2021-01-03 DIAGNOSIS — S72002D Fracture of unspecified part of neck of left femur, subsequent encounter for closed fracture with routine healing: Secondary | ICD-10-CM | POA: Diagnosis not present

## 2021-01-03 DIAGNOSIS — F419 Anxiety disorder, unspecified: Secondary | ICD-10-CM | POA: Diagnosis not present

## 2021-01-05 DIAGNOSIS — F419 Anxiety disorder, unspecified: Secondary | ICD-10-CM | POA: Diagnosis not present

## 2021-01-05 DIAGNOSIS — Z96642 Presence of left artificial hip joint: Secondary | ICD-10-CM | POA: Diagnosis not present

## 2021-01-05 DIAGNOSIS — H9193 Unspecified hearing loss, bilateral: Secondary | ICD-10-CM | POA: Diagnosis not present

## 2021-01-05 DIAGNOSIS — S72002D Fracture of unspecified part of neck of left femur, subsequent encounter for closed fracture with routine healing: Secondary | ICD-10-CM | POA: Diagnosis not present

## 2021-01-05 DIAGNOSIS — Z9181 History of falling: Secondary | ICD-10-CM | POA: Diagnosis not present

## 2021-01-05 DIAGNOSIS — F320 Major depressive disorder, single episode, mild: Secondary | ICD-10-CM | POA: Diagnosis not present

## 2021-01-24 DIAGNOSIS — S72032D Displaced midcervical fracture of left femur, subsequent encounter for closed fracture with routine healing: Secondary | ICD-10-CM | POA: Diagnosis not present

## 2021-02-03 DIAGNOSIS — Z1211 Encounter for screening for malignant neoplasm of colon: Secondary | ICD-10-CM | POA: Diagnosis not present

## 2021-02-03 DIAGNOSIS — Z1212 Encounter for screening for malignant neoplasm of rectum: Secondary | ICD-10-CM | POA: Diagnosis not present

## 2021-04-03 DIAGNOSIS — M8589 Other specified disorders of bone density and structure, multiple sites: Secondary | ICD-10-CM | POA: Diagnosis not present

## 2021-04-05 DIAGNOSIS — L821 Other seborrheic keratosis: Secondary | ICD-10-CM | POA: Diagnosis not present

## 2021-04-05 DIAGNOSIS — R202 Paresthesia of skin: Secondary | ICD-10-CM | POA: Diagnosis not present

## 2021-04-05 DIAGNOSIS — L720 Epidermal cyst: Secondary | ICD-10-CM | POA: Diagnosis not present

## 2021-04-05 DIAGNOSIS — L400 Psoriasis vulgaris: Secondary | ICD-10-CM | POA: Diagnosis not present

## 2021-05-30 DIAGNOSIS — S72032D Displaced midcervical fracture of left femur, subsequent encounter for closed fracture with routine healing: Secondary | ICD-10-CM | POA: Diagnosis not present

## 2021-06-02 DIAGNOSIS — M25531 Pain in right wrist: Secondary | ICD-10-CM | POA: Diagnosis not present

## 2021-06-02 DIAGNOSIS — M67831 Other specified disorders of synovium, right wrist: Secondary | ICD-10-CM | POA: Diagnosis not present

## 2021-08-10 DIAGNOSIS — Z1231 Encounter for screening mammogram for malignant neoplasm of breast: Secondary | ICD-10-CM | POA: Diagnosis not present

## 2021-08-11 DIAGNOSIS — H2513 Age-related nuclear cataract, bilateral: Secondary | ICD-10-CM | POA: Diagnosis not present

## 2021-08-11 DIAGNOSIS — H5213 Myopia, bilateral: Secondary | ICD-10-CM | POA: Diagnosis not present

## 2021-08-23 DIAGNOSIS — R922 Inconclusive mammogram: Secondary | ICD-10-CM | POA: Diagnosis not present

## 2021-08-23 DIAGNOSIS — R928 Other abnormal and inconclusive findings on diagnostic imaging of breast: Secondary | ICD-10-CM | POA: Diagnosis not present

## 2021-09-06 DIAGNOSIS — C50412 Malignant neoplasm of upper-outer quadrant of left female breast: Secondary | ICD-10-CM | POA: Diagnosis not present

## 2021-09-06 DIAGNOSIS — C50211 Malignant neoplasm of upper-inner quadrant of right female breast: Secondary | ICD-10-CM | POA: Diagnosis not present

## 2021-09-08 ENCOUNTER — Telehealth: Payer: Self-pay | Admitting: Hematology

## 2021-09-08 ENCOUNTER — Encounter: Payer: Self-pay | Admitting: Internal Medicine

## 2021-09-08 NOTE — Telephone Encounter (Signed)
Spoke to patient to confirm afternoon clinic appointment on 1/11, [facility] will send paperwork

## 2021-09-10 DIAGNOSIS — C801 Malignant (primary) neoplasm, unspecified: Secondary | ICD-10-CM

## 2021-09-10 HISTORY — DX: Malignant (primary) neoplasm, unspecified: C80.1

## 2021-09-18 ENCOUNTER — Encounter: Payer: Self-pay | Admitting: *Deleted

## 2021-09-18 DIAGNOSIS — Z170 Estrogen receptor positive status [ER+]: Secondary | ICD-10-CM | POA: Insufficient documentation

## 2021-09-18 DIAGNOSIS — C50211 Malignant neoplasm of upper-inner quadrant of right female breast: Secondary | ICD-10-CM | POA: Insufficient documentation

## 2021-09-19 NOTE — Progress Notes (Signed)
Radiation Oncology         [phone] ________________________________  Multidisciplinary Breast Oncology Clinic [facility]) Initial Outpatient Consultation  Name: Deborah Dougherty MRN: [id number]  Date: 09/20/2021  DOB: 1945/12/13  [id number], Delfino Lovett, MD  Jovita Kussmaul, MD   REFERRING PHYSICIAN: [name], MD  DIAGNOSIS: The encounter diagnosis was Malignant neoplasm of upper-inner quadrant of right breast in female, estrogen receptor positive ([place]).  Stage IA (cT1c, cN0, cM0) Right Breast UIQ, Invasive Ductal Carcinoma, ER+ / PR+ / Her2-, Grade 1-2    ICD-10-CM   1. Malignant neoplasm of upper-inner quadrant of right breast in female, estrogen receptor positive ([place])  C50.211    Z17.0       HISTORY OF PRESENT ILLNESS::Deborah Dougherty is a 76 y.o. female who is presenting to the office today for evaluation of her newly diagnosed breast cancer. She is accompanied by her sister in law. She is doing well overall.   She had routine screening mammography on 08/10/21 showing an indeterminate irregular mass in the right breast She underwent right breast diagnostic mammogram [facility] on 08/23/21 showing the indeterminate irregular mass in the right breast to measure 0.9 x 1.4 x 1.2 cm. Right breast US at [facility] on that same date further revealed the irregular right breast mass as highly suggestive of malignancy.  Right breast 1:00 biopsy, 7cmfn, on 09/06/21 showed: grade 1-2 invasive ductal carcinoma (involving 3/3 biopsy fragments), extending 0.9 cm in the maximum extent . Prognostic indicators significant for: estrogen receptor, 100% positive and progesterone receptor, 80% positive, both with strong staining intensity. Proliferation marker Ki67 at 10%. HER2 negative.  Menarche: 76 years old LMP: when she was 76 years old  Contraceptive: never used  HRT: never used    The patient was referred today for presentation in the multidisciplinary conference.  Radiology studies and pathology  slides were presented there for review and discussion of treatment options.  A consensus was discussed regarding potential next steps.  PREVIOUS RADIATION THERAPY: No  PAST MEDICAL HISTORY:  Past Medical History:  Diagnosis Date   Anxiety    Depression     PAST SURGICAL HISTORY: Past Surgical History:  Procedure Laterality Date   [place]   TOTAL HIP ARTHROPLASTY Left 12/09/2020   Procedure: TOTAL HIP ARTHROPLASTY ANTERIOR APPROACH;  Surgeon: Rod Can, MD;  Location: [facility];  Service: Orthopedics;  Laterality: Left;   WRIST SURGERY  2010    FAMILY HISTORY:  Family History  Problem Relation Age of Onset   Cancer Mother    Breast cancer Mother    Lung cancer Mother    Cancer Father     SOCIAL HISTORY:  Social History   Socioeconomic History   Marital status: Widowed    Spouse name: Not on file   Number of children: 1   Years of education: Not on file   Highest education level: Bachelor's degree (e.g., BA, AB, BS)  Occupational History   Not on file  Tobacco Use   Smoking status: Former    Types: Cigarettes    Quit date: 1969    Years since quitting: 54.0   Smokeless tobacco: Never   Tobacco comments:    quit 1969  Substance and Sexual Activity   Alcohol use: Yes    Comment: occasionally, 6 oz daily   Drug use: Never   Sexual activity: Not on file  Other Topics Concern   Not on file  Social History  Narrative   Lives alone   Caffeine 10 oz daily   Social Determinants of Health   Financial Resource Strain: Not on file  Food Insecurity: Not on file  Transportation Needs: Not on file  Physical Activity: Not on file  Stress: Not on file  Social Connections: Not on file    ALLERGIES:  Allergies  Allergen Reactions   Cefuroxime Axetil Nausea And Vomiting    MEDICATIONS:  Current Outpatient Medications  Medication Sig Dispense Refill   acetaminophen (TYLENOL) 325 MG tablet Take 2 tablets (650 mg total)  by mouth every 6 (six) hours as needed for mild pain or headache.     calcium carbonate (OS-CAL) 1250 (500 Ca) MG chewable tablet Chew 1 tablet by mouth daily.     Cholecalciferol (VITAMIN D3) 50 MCG (2000 UT) TABS Take by mouth daily.     diphenhydrAMINE (BENADRYL) 25 mg capsule Take 1 capsule (25 mg total) by mouth every 8 (eight) hours as needed for up to 5 days for itching or allergies.     docusate sodium (COLACE) 100 MG capsule Take 1 capsule (100 mg total) by mouth 2 (two) times daily as needed for mild constipation. 10 capsule 0   menthol-cetylpyridinium (CEPACOL) 3 MG lozenge Take 1 lozenge (3 mg total) by mouth as needed for sore throat.     Multiple Vitamins-Minerals (CENTRUM PO) Take 1 tablet by mouth daily.     ondansetron (ZOFRAN) 4 MG tablet Take 1 tablet (4 mg total) by mouth every 6 (six) hours as needed for nausea.  0   PARoxetine (PAXIL) 20 MG tablet Take 20 mg by mouth daily.     polyethylene glycol (MIRALAX / GLYCOLAX) 17 g packet Take 17 g by mouth daily as needed for moderate constipation.  0   vitamin B-12 1000 MCG tablet Take 1 tablet (1,000 mcg total) by mouth daily.     No current facility-administered medications for this encounter.    REVIEW OF SYSTEMS: A 10+ POINT REVIEW OF SYSTEMS WAS OBTAINED including neurology, dermatology, psychiatry, cardiac, respiratory, lymph, extremities, GI, GU, musculoskeletal, constitutional, reproductive, HEENT. On the provided form, she reports pain in her toes defined as cramping and stabbing pain, wearing glasses, shortness of breath when walking and using stairs, occasional rectal bleeding, psoriasis on the back of her head, back pain, arthritis, difficulties walking, forgetfulness, and depression. She denies any other symptoms.    PHYSICAL EXAM:    Vitals with BMI 09/20/2021  Height 5' 7"   Weight 154 lbs 8 oz  BMI 16.10  Systolic 960  Diastolic 79  Pulse 74   Lungs are clear to auscultation bilaterally. Heart has regular rate  and rhythm. No palpable cervical, supraclavicular, or axillary adenopathy. Abdomen soft, non-tender, normal bowel sounds. Breast: Left breast with no palpable mass, nipple discharge, or bleeding. Right breast with bruising in the upper aspect of the breast, and palpable induration in the upper inner aspect of the breast.  Measuring approximately centimeter and a half.  No nipple discharge or bleeding.  No skin involvement   KPS = 90  100 - Normal; no complaints; no evidence of disease. 90   - Able to carry on normal activity; minor signs or symptoms of disease. 80   - Normal activity with effort; some signs or symptoms of disease. 76   - Cares for self; unable to carry on normal activity or to do active work. 60   - Requires occasional assistance, but is able to care for most of  his personal needs. 50   - Requires considerable assistance and frequent medical care. 14   - Disabled; requires special care and assistance. 61   - Severely disabled; hospital admission is indicated although death not imminent. 32   - Very sick; hospital admission necessary; active supportive treatment necessary. 10   - Moribund; fatal processes progressing rapidly. 0     - Dead  Karnofsky DA, Abelmann [place], Craver LS and Burchenal [facility] [phone]) The use of the nitrogen mustards in the palliative treatment of carcinoma: with particular reference to bronchogenic carcinoma Cancer 1 634-56  LABORATORY DATA:  Lab Results  Component Value Date   WBC 7.3 09/20/2021   HGB 12.6 09/20/2021   HCT 37.6 09/20/2021   MCV 95.2 09/20/2021   PLT 234 09/20/2021   Lab Results  Component Value Date   NA 140 09/20/2021   K 3.9 09/20/2021   CL 105 09/20/2021   CO2 28 09/20/2021   Lab Results  Component Value Date   ALT 12 09/20/2021   AST 13 (L) 09/20/2021   ALKPHOS 67 09/20/2021   BILITOT 0.5 09/20/2021    PULMONARY FUNCTION TEST:   Recent Review Flowsheet Data   There is no flowsheet data to display.     RADIOGRAPHY:  No results found.    IMPRESSION: Stage IA (cT1c, cN0, cM0) Right Breast UIQ, Invasive Ductal Carcinoma, ER+ / PR+ / Her2-, Grade 1-2   Patient will be a good candidate for breast conservation with radiotherapy to the right breast. We discussed the general course of radiation, potential side effects, and toxicities with radiation and the patient is interested in this approach.   Depending on final pathologic results the patient may be a potential candidate for surgery and hormonal therapy alone with the understanding that her local recurrence will be slightly higher but have no survival benefit with additional radiation therapy.  PLAN:  Right lumpectomy and SNL  Adjuvant radiation depending on final pathology  Aromatase inhibitor    ------------------------------------------------  Blair Promise, PhD, MD  This document serves as a record of services personally performed by Gery Pray, MD. It was created on his behalf by Roney Mans, a trained medical scribe. The creation of this record is based on the scribe's personal observations and the provider's statements to them. This document has been checked and approved by the attending provider.

## 2021-09-20 ENCOUNTER — Encounter: Payer: Self-pay | Admitting: *Deleted

## 2021-09-20 ENCOUNTER — Inpatient Hospital Stay: Payer: Medicare PPO | Attending: Hematology and Oncology

## 2021-09-20 ENCOUNTER — Ambulatory Visit: Payer: Medicare PPO | Attending: General Surgery | Admitting: Physical Therapy

## 2021-09-20 ENCOUNTER — Encounter: Payer: Self-pay | Admitting: Physical Therapy

## 2021-09-20 ENCOUNTER — Ambulatory Visit (HOSPITAL_BASED_OUTPATIENT_CLINIC_OR_DEPARTMENT_OTHER): Payer: Medicare PPO | Admitting: Genetic Counselor

## 2021-09-20 ENCOUNTER — Inpatient Hospital Stay (HOSPITAL_BASED_OUTPATIENT_CLINIC_OR_DEPARTMENT_OTHER): Payer: Medicare PPO | Admitting: Hematology and Oncology

## 2021-09-20 ENCOUNTER — Ambulatory Visit: Payer: Self-pay | Admitting: General Surgery

## 2021-09-20 ENCOUNTER — Other Ambulatory Visit: Payer: Self-pay

## 2021-09-20 ENCOUNTER — Ambulatory Visit
Admission: RE | Admit: 2021-09-20 | Discharge: 2021-09-20 | Disposition: A | Discharge status: Home / Self Care | Payer: Medicare PPO | Source: Ambulatory Visit | Attending: Radiation Oncology | Admitting: Radiation Oncology

## 2021-09-20 DIAGNOSIS — Z90710 Acquired absence of both cervix and uterus: Secondary | ICD-10-CM | POA: Insufficient documentation

## 2021-09-20 DIAGNOSIS — Z170 Estrogen receptor positive status [ER+]: Secondary | ICD-10-CM

## 2021-09-20 DIAGNOSIS — Z853 Personal history of malignant neoplasm of breast: Secondary | ICD-10-CM | POA: Diagnosis not present

## 2021-09-20 DIAGNOSIS — C50211 Malignant neoplasm of upper-inner quadrant of right female breast: Secondary | ICD-10-CM | POA: Insufficient documentation

## 2021-09-20 DIAGNOSIS — Z803 Family history of malignant neoplasm of breast: Secondary | ICD-10-CM | POA: Diagnosis not present

## 2021-09-20 DIAGNOSIS — Z800 Family history of malignant neoplasm of digestive organs: Secondary | ICD-10-CM | POA: Diagnosis not present

## 2021-09-20 DIAGNOSIS — R293 Abnormal posture: Secondary | ICD-10-CM | POA: Diagnosis not present

## 2021-09-20 LAB — CBC WITH DIFFERENTIAL (CANCER CENTER ONLY)
Abs Immature Granulocytes: 0.01 10*3/uL (ref 0.00–0.07)
Basophils Absolute: 0 10*3/uL (ref 0.0–0.1)
Basophils Relative: 0 %
Eosinophils Absolute: 0.1 10*3/uL (ref 0.0–0.5)
Eosinophils Relative: 1 %
HCT: 37.6 % (ref 36.0–46.0)
Hemoglobin: 12.6 g/dL (ref 12.0–15.0)
Immature Granulocytes: 0 %
Lymphocytes Relative: 37 %
Lymphs Abs: 2.7 10*3/uL (ref 0.7–4.0)
MCH: 31.9 pg (ref 26.0–34.0)
MCHC: 33.5 g/dL (ref 30.0–36.0)
MCV: 95.2 fL (ref 80.0–100.0)
Monocytes Absolute: 0.6 10*3/uL (ref 0.1–1.0)
Monocytes Relative: 8 %
Neutro Abs: 4 10*3/uL (ref 1.7–7.7)
Neutrophils Relative %: 54 %
Platelet Count: 234 10*3/uL (ref 150–400)
RBC: 3.95 MIL/uL (ref 3.87–5.11)
RDW: 12.7 % (ref 11.5–15.5)
WBC Count: 7.3 10*3/uL (ref 4.0–10.5)
nRBC: 0 % (ref 0.0–0.2)

## 2021-09-20 LAB — CMP (CANCER CENTER ONLY)
ALT: 12 U/L (ref 0–44)
AST: 13 U/L — ABNORMAL LOW (ref 15–41)
Albumin: 3.9 g/dL (ref 3.5–5.0)
Alkaline Phosphatase: 67 U/L (ref 38–126)
Anion gap: 7 (ref 5–15)
BUN: 13 mg/dL (ref 8–23)
CO2: 28 mmol/L (ref 22–32)
Calcium: 9.1 mg/dL (ref 8.9–10.3)
Chloride: 105 mmol/L (ref 98–111)
Creatinine: 0.9 mg/dL (ref 0.44–1.00)
GFR, Estimated: >60 mL/min (ref >60)
Glucose, Bld: 99 mg/dL (ref 70–99)
Potassium: 3.9 mmol/L (ref 3.5–5.1)
Sodium: 140 mmol/L (ref 135–145)
Total Bilirubin: 0.5 mg/dL (ref 0.3–1.2)
Total Protein: 6.6 g/dL (ref 6.5–8.1)

## 2021-09-20 LAB — GENETIC SCREENING ORDER

## 2021-09-20 NOTE — Assessment & Plan Note (Signed)
09/14/2021: Screening mammogram detected right upper medial quadrant mass 1.4 cm spiculated mass by mammogram.  By ultrasound it measured 1 cm axilla negative, biopsy revealed grade 1-2 IDC ER 100%, PR 80%, HER2 negative, Ki-67 10%  Pathology and radiology counseling:Discussed with the patient, the details of pathology including the type of breast cancer,the clinical staging, the significance of ER, PR and HER-2/neu receptors and the implications for treatment. After reviewing the pathology in detail, we proceeded to discuss the different treatment options between surgery, radiation, and antiestrogen therapies.  Recommendations: 1. Breast conserving surgery followed by 2. +/- Adjuvant radiation therapy (based on final margins) 3. Adjuvant antiestrogen therapy    Return to clinic after surgery to discuss final pathology report

## 2021-09-20 NOTE — Therapy (Signed)
OUTPATIENT PHYSICAL THERAPY BREAST CANCER BASELINE EVALUATION   Patient Name: Deborah Dougherty MRN: [id number] DOB:Apr 14, 1946, 76 y.o., female Today's Date: 09/20/2021   PT End of Session - 09/20/21 1654     Visit Number 1    Number of Visits 2    Date for PT Re-Evaluation 11/15/21    PT Start Time 1312    PT Stop Time 1326   Also saw pt from 1349-1413 for a total of 38 min   PT Time Calculation (min) 14 min    Activity Tolerance Patient tolerated treatment well    Behavior During Therapy WFL for tasks assessed/performed             Past Medical History:  Diagnosis Date   Anxiety    Depression    Past Surgical History:  Procedure Laterality Date   [place] Left 12/09/2020   Procedure: TOTAL HIP ARTHROPLASTY ANTERIOR APPROACH;  Surgeon: Rod Can, MD;  Location: [facility];  Service: Orthopedics;  Laterality: Left;   WRIST SURGERY  2010   Patient Active Problem List   Diagnosis Date Noted   Malignant neoplasm of upper-inner quadrant of right breast in female, estrogen receptor positive ([place]) 09/18/2021   Hip fracture ([place]) 12/08/2020    PCP: Burnard Bunting, MD  REFERRING PROVIDER: Jovita Kussmaul, MD  REFERRING DIAG: Right breast cancer  THERAPY DIAG:  Malignant neoplasm of upper-inner quadrant of right breast in female, estrogen receptor positive ([place])  Abnormal posture  ONSET DATE: 08/10/2021  SUBJECTIVE                                                                                                                                                                                           SUBJECTIVE STATEMENT: Patient reports she is here today to be seen by her medical team for her newly diagnosed right breast cancer.   PERTINENT HISTORY:  Patient was diagnosed on 08/10/2021 with right grade II invasive ductal carcinoma breast cancer. It is located in the upper inner quadrant. It is ER/PR  positive and HER2 negative with a Ki67 of 10%. She has hardware in her left wrist from a surgery after a fall and had her left hip replaced on 12/09/2020.  PATIENT GOALS   reduce lymphedema risk and learn post op HEP.   PAIN:  Are you having pain? No   PRECAUTIONS: Active CA  WEIGHT BEARING RESTRICTIONS No  FALLS:  Has patient fallen in last 6 months? No, Number of falls: 0  LIVING ENVIRONMENT: Patient lives with: alone Lives in: House/apartment Has following equipment  at home: None  OCCUPATION: Retired    LEISURE: She does not exercise  PRIOR LEVEL OF FUNCTION: Independent   OBJECTIVE  COGNITION:  Overall cognitive status: Within functional limits for tasks assessed    POSTURE:  Forward head and rounded shoulders posture  UPPER EXTREMITY AROM/PROM:  A/PROM Right 09/20/2021 Left 09/20/2021  Shoulder extension 60 54  Shoulder flexion 149 134  Shoulder abduction 159 154  Shoulder internal rotation 81 69  Shoulder external rotation 74 83    (Blank rows = not tested)    CERVICAL AROM: All within normal limits:    Percent limited  Flexion WNL  Extension WNL  Right lateral flexion 50%  Left lateral flexion 50%  Right rotation 25%  Left rotation   25%        UPPER EXTREMITY STRENGTH: WNL   LYMPHEDEMA ASSESSMENTS:   LANDMARK RIGHT 09/20/2021 LEFT 09/20/2021  10 cm proximal to olecranon process 26.7 26.7  Olecranon process 24 23.4  10 cm proximal to ulnar styloid process 22.2 21.2  Just proximal to ulnar styloid process 15.8 15.5  Across hand at thumb web space 19 18.5  At base of 2nd digit 6.3 5.9  (Blank rows = not tested)   L-DEX LYMPHEDEMA SCREENING:  The patient was assessed using the L-Dex machine today to produce a lymphedema index baseline score. The patient will be reassessed on a regular basis (typically every 3 months) to obtain new L-Dex scores. If the score is > 6.5 points away from his/her baseline score indicating onset of subclinical  lymphedema, it will be recommended to wear a compression garment for 4 weeks, 12 hours per day and then be reassessed. If the score continues to be > 6.5 points from baseline at reassessment, we will initiate lymphedema treatment. Assessing in this manner has a 95% rate of preventing clinically significant lymphedema.  L-DEX LYMPHEDEMA SCREENING Measurement Type: Unilateral L-DEX MEASUREMENT EXTREMITY: Upper Extremity POSITION : Standing DOMINANT SIDE: Right At Risk Side: Right BASELINE SCORE (UNILATERAL): -2.2  QUICK DASH SURVEY:  Katina Dung - 09/20/21 0001     Open a tight or new jar Mild difficulty    Do heavy household chores (wash walls, wash floors) No difficulty    Carry a shopping bag or briefcase No difficulty    Wash your back No difficulty    Use a knife to cut food No difficulty    Recreational activities in which you take some force or impact through your arm, shoulder, or hand (golf, hammering, tennis) Mild difficulty    During the past week, to what extent has your arm, shoulder or hand problem interfered with your normal social activities with family, friends, neighbors, or groups? Not at all    During the past week, to what extent has your arm, shoulder or hand problem limited your work or other regular daily activities Not at all    Arm, shoulder, or hand pain. Mild    Tingling (pins and needles) in your arm, shoulder, or hand None    Difficulty Sleeping No difficulty    DASH Score 6.82 %              PATIENT EDUCATION:  Education details: Lymphedema risk reduction and post op shoulder/posture HEP Person educated: Patient Education method: Explanation, Demonstration, Handout Education comprehension: Patient verbalized understanding and returned demonstration   HOME EXERCISE PROGRAM: Patient was instructed today in a home exercise program today for post op shoulder range of motion. These included active assist shoulder flexion  in sitting, scapular retraction,  wall walking with shoulder abduction, and hands behind head external rotation.  She was encouraged to do these twice a day, holding 3 seconds and repeating 5 times when permitted by her physician.   ASSESSMENT:  CLINICAL IMPRESSION: Patient was diagnosed on 08/10/2021 with right grade II invasive ductal carcinoma breast cancer. It is located in the upper inner quadrant. It is ER/PR positive and HER2 negative with a Ki67 of 10%. She has hardware in her left wrist from a surgery after a fall and had her left hip replaced on 12/09/2020. Her multidisciplinary medical team met prior to her assessments to determine a recommended treatment plan. She is planning to have a right lumpectomy and sentinel node biopsy followed by radiation and anti-estrogen therapy. She will benefit from a post op PT reassessment to determine needs and from L-Dex screens every 3 months for 2 years to detect subclinical lymphedema.  Pt will benefit from skilled therapeutic intervention to improve on the following deficits: Decreased knowledge of precautions, impaired UE functional use, pain, decreased ROM, postural dysfunction.   PT treatment/interventions: ADL/self-care home management, pt/family education, therapeutic exercise  REHAB POTENTIAL: Excellent  CLINICAL DECISION MAKING: Stable/uncomplicated  EVALUATION COMPLEXITY: Low   GOALS: Goals reviewed with patient? YES  LONG TERM GOALS: (STG=LTG)   Name Target Date Goal status  1 Pt will be able to verbalize understanding of pertinent lymphedema risk reduction practices relevant to her dx specifically related to skin care.  Baseline:  No knowledge 09/20/2021 Achieved at eval  2 Pt will be able to return demo and/or verbalize understanding of the post op HEP related to regaining shoulder ROM. Baseline:  No knowledge 09/20/2021 Achieved at eval  3 Pt will be able to verbalize understanding of the importance of attending the post op After Breast CA Class for further  lymphedema risk reduction education and therapeutic exercise.  Baseline:  No knowledge 09/20/2021 Achieved at eval  4 Pt will demo she has regained full shoulder ROM and function post operatively compared to baselines.  Baseline: See objective measurements taken today. 11/15/2020      PLAN: PT FREQUENCY/DURATION: EVAL and 1 follow up appointment.   PLAN FOR NEXT SESSION: will reassess 3-4 weeks post op to determine needs.   Patient will follow up at outpatient cancer rehab 3-4 weeks following surgery.  If the patient requires physical therapy at that time, a specific plan will be dictated and sent to the referring physician for approval. The patient was educated today on appropriate basic range of motion exercises to begin post operatively and the importance of attending the After Breast Cancer class following surgery.  Patient was educated today on lymphedema risk reduction practices as it pertains to recommendations that will benefit the patient immediately following surgery.  She verbalized good understanding.    Physical Therapy Information for After Breast Cancer Surgery/Treatment:  Lymphedema is a swelling condition that you may be at risk for in your arm if you have lymph nodes removed from the armpit area.  After a sentinel node biopsy, the risk is approximately 5-9% and is higher after an axillary node dissection.  There is treatment available for this condition and it is not life-threatening.  Contact your physician or physical therapist with concerns. You may begin the 4 shoulder/posture exercises (see additional sheet) when permitted by your physician (typically a week after surgery).  If you have drains, you may need to wait until those are removed before beginning range of motion exercises.  A general recommendation is to not lift your arms above shoulder height until drains are removed.  These exercises should be done to your tolerance and gently.  This is not a "no pain/no gain" type  of recovery so listen to your body and stretch into the range of motion that you can tolerate, stopping if you have pain.  If you are having immediate reconstruction, ask your plastic surgeon about doing exercises as he or she may want you to wait. We encourage you to attend the free one time ABC (After Breast Cancer) class offered by [facility].  You will learn information related to lymphedema risk, prevention and treatment and additional exercises to regain mobility following surgery.  You can call [phone] for more information.  This is offered the 1st and 3rd Monday of each month.  You only attend the class one time. While undergoing any medical procedure or treatment, try to avoid blood pressure being taken or needle sticks from occurring on the arm on the side of cancer.   This recommendation begins after surgery and continues for the rest of your life.  This may help reduce your risk of getting lymphedema (swelling in your arm). An excellent resource for those seeking information on lymphedema is the National Lymphedema Network's web site. It can be accessed at [facility] [place].org If you notice swelling in your hand, arm or breast at any time following surgery (even if it is many years from now), please contact your doctor or physical therapist to discuss this.  Lymphedema can be treated at any time but it is easier for you if it is treated early on.  If you feel like your shoulder motion is not returning to normal in a reasonable amount of time, please contact your surgeon or physical therapist.  [facility] [place], [place], [place] [phone]; [address] [place], [place] [address] ABC CLASS After Breast Cancer Class  After Breast Cancer Class is a specially designed exercise class to assist you in a safe recover after having breast cancer surgery.  In this class you will learn how to get back to full function whether your drains were just removed or if you had surgery a month  ago.  This one-time class is held the 1st and 3rd Monday of every month from 11:00 a.m. until 12:00 noon at the [facility] located at [address], [place] [address]  This class is FREE and space is limited. For more information or to register for the next available class, call [phone].  Class Goals  Understand specific stretches to improve the flexibility of you chest and shoulder. Learn ways to safely strengthen your upper body and improve your posture. Understand the warning signs of infection and why you may be at risk for an arm infection. Learn about Lymphedema and prevention.  ** You do not attend this class until after surgery.  Drains must be removed to participate  Patient was instructed today in a home exercise program today for post op shoulder range of motion. These included active assist shoulder flexion in sitting, scapular retraction, wall walking with shoulder abduction, and hands behind head external rotation.  She was encouraged to do these twice a day, holding 3 seconds and repeating 5 times when permitted by her physician.    Bria Sparr,MARTI COOPER 09/20/2021, 4:55 PM

## 2021-09-20 NOTE — Progress Notes (Signed)
[facility] NOTE  Patient Care Team: Burnard Bunting, MD as PCP - General (Internal Medicine) Rolm Bookbinder, MD as Referring Physician (Dermatology) Mauro Kaufmann, RN as Oncology Nurse Navigator Rockwell Germany, RN as Oncology Nurse Navigator Jovita Kussmaul, MD as Consulting Physician (General Surgery) Nicholas Lose, MD as Consulting Physician (Hematology and Oncology) Gery Pray, MD as Consulting Physician (Radiation Oncology)  CHIEF COMPLAINTS/PURPOSE OF CONSULTATION:  Newly diagnosed breast cancer  HISTORY OF PRESENTING ILLNESS:  Deborah Dougherty 76 y.o. female is here because of recent diagnosis of right breast cancer.  Patient has a family history of her mother diagnosed at age 90 with breast cancer.  She presented with a screening mammogram detected a right breast mass 1.4 cm by mammogram and 1 cm by ultrasound.  Axilla was negative.  Biopsy of this mass came back as grade 1 through 2 invasive ductal carcinoma that was ER 100%, PR 80%, HER2 negative, Ki-67 10%.  She was presented this morning to the multidisciplinary tumor board and she is here today to discuss her treatment plan.  I reviewed her records extensively and collaborated the history with the patient.  SUMMARY OF ONCOLOGIC HISTORY: Oncology History  Malignant neoplasm of upper-inner quadrant of right breast in female, estrogen receptor positive ([place])  09/14/2021 Initial Diagnosis   Screening mammogram detected right upper medial quadrant mass 1.4 cm spiculated mass by mammogram.  By ultrasound it measured 1 cm axilla negative, biopsy revealed grade 1-2 IDC ER 100%, PR 80%, HER2 negative, Ki-67 10%      MEDICAL HISTORY:  Past Medical History:  Diagnosis Date   Anxiety    Depression     SURGICAL HISTORY: Past Surgical History:  Procedure Laterality Date   [place] HIP ARTHROPLASTY Left 12/09/2020   Procedure: TOTAL HIP ARTHROPLASTY  ANTERIOR APPROACH;  Surgeon: Rod Can, MD;  Location: [facility];  Service: Orthopedics;  Laterality: Left;   WRIST SURGERY  2010    SOCIAL HISTORY: Social History   Socioeconomic History   Marital status: Widowed    Spouse name: Not on file   Number of children: 1   Years of education: Not on file   Highest education level: Bachelor's degree (e.g., BA, AB, BS)  Occupational History   Not on file  Tobacco Use   Smoking status: Never   Smokeless tobacco: Never   Tobacco comments:    quit 1969  Substance and Sexual Activity   Alcohol use: Yes    Comment: occasionally, 6 oz daily   Drug use: Never   Sexual activity: Not on file  Other Topics Concern   Not on file  Social History Narrative   Lives alone   Caffeine 10 oz daily   Social Determinants of Health   Financial Resource Strain: Not on file  Food Insecurity: Not on file  Transportation Needs: Not on file  Physical Activity: Not on file  Stress: Not on file  Social Connections: Not on file  Intimate Partner Violence: Not on file    FAMILY HISTORY: Family History  Problem Relation Age of Onset   Cancer Mother    Cancer Father     ALLERGIES:  is allergic to cefuroxime axetil.  MEDICATIONS:  Current Outpatient Medications  Medication Sig Dispense Refill   acetaminophen (TYLENOL) 325 MG tablet Take 2 tablets (650 mg total) by mouth every 6 (six) hours as needed for mild pain or headache.  calcium carbonate (OS-CAL) 1250 (500 Ca) MG chewable tablet Chew 1 tablet by mouth daily.     Cholecalciferol (VITAMIN D3) 50 MCG (2000 UT) TABS Take by mouth daily.     diphenhydrAMINE (BENADRYL) 25 mg capsule Take 1 capsule (25 mg total) by mouth every 8 (eight) hours as needed for up to 5 days for itching or allergies.     docusate sodium (COLACE) 100 MG capsule Take 1 capsule (100 mg total) by mouth 2 (two) times daily as needed for mild constipation. 10 capsule 0   menthol-cetylpyridinium (CEPACOL) 3 MG lozenge  Take 1 lozenge (3 mg total) by mouth as needed for sore throat.     Multiple Vitamins-Minerals (CENTRUM PO) Take 1 tablet by mouth daily.     ondansetron (ZOFRAN) 4 MG tablet Take 1 tablet (4 mg total) by mouth every 6 (six) hours as needed for nausea.  0   PARoxetine (PAXIL) 20 MG tablet Take 20 mg by mouth daily.     polyethylene glycol (MIRALAX / GLYCOLAX) 17 g packet Take 17 g by mouth daily as needed for moderate constipation.  0   vitamin B-12 1000 MCG tablet Take 1 tablet (1,000 mcg total) by mouth daily.     No current facility-administered medications for this visit.    REVIEW OF SYSTEMS:   Constitutional: Denies fevers, chills or abnormal night sweats Eyes: Denies blurriness of vision, double vision or watery eyes Ears, nose, mouth, throat, and face: Denies mucositis or sore throat Respiratory: Denies cough, dyspnea or wheezes Cardiovascular: Denies palpitation, chest discomfort or lower extremity swelling Gastrointestinal:  Denies nausea, heartburn or change in bowel habits Skin: Denies abnormal skin rashes Lymphatics: Denies new lymphadenopathy or easy bruising Neurological:Denies numbness, tingling or new weaknesses Behavioral/Psych: Mood is stable, no new changes  Breast:  Denies any palpable lumps or discharge All other systems were reviewed with the patient and are negative.  PHYSICAL EXAMINATION: ECOG PERFORMANCE STATUS: 0 - Asymptomatic  There were no vitals filed for this visit. There were no vitals filed for this visit.     LABORATORY DATA:  I have reviewed the data as listed Lab Results  Component Value Date   WBC 9.4 01/02/2021   HGB 12.4 01/02/2021   HCT 38.4 01/02/2021   MCV 100.5 (H) 01/02/2021   PLT 280 01/02/2021   Lab Results  Component Value Date   NA 146 (H) 01/02/2021   K 3.9 01/02/2021   CL 113 (H) 01/02/2021   CO2 24 01/02/2021    RADIOGRAPHIC STUDIES: I have personally reviewed the radiological reports and agreed with the findings  in the report.  ASSESSMENT AND PLAN:  Malignant neoplasm of upper-inner quadrant of right breast in female, estrogen receptor positive ([place]) 09/14/2021: Screening mammogram detected right upper medial quadrant mass 1.4 cm spiculated mass by mammogram.  By ultrasound it measured 1 cm axilla negative, biopsy revealed grade 1-2 IDC ER 100%, PR 80%, HER2 negative, Ki-67 10%  Pathology and radiology counseling:Discussed with the patient, the details of pathology including the type of breast cancer,the clinical staging, the significance of ER, PR and HER-2/neu receptors and the implications for treatment. After reviewing the pathology in detail, we proceeded to discuss the different treatment options between surgery, radiation, and antiestrogen therapies.  Recommendations: 1. Breast conserving surgery followed by 2. +/- Adjuvant radiation therapy (based on final margins) 3. Adjuvant antiestrogen therapy    Return to clinic after surgery to discuss final pathology report    All questions were answered.  The patient knows to call the clinic with any problems, questions or concerns.    Harriette Ohara, MD 09/20/21

## 2021-09-21 ENCOUNTER — Encounter: Payer: Self-pay | Admitting: Genetic Counselor

## 2021-09-21 DIAGNOSIS — Z803 Family history of malignant neoplasm of breast: Secondary | ICD-10-CM | POA: Insufficient documentation

## 2021-09-21 NOTE — Progress Notes (Signed)
REFERRING PROVIDER: Nicholas Lose, MD [address] [place], [place] [address]  PRIMARY PROVIDER:  Burnard Bunting, MD  PRIMARY REASON FOR VISIT:  1. Malignant neoplasm of upper-inner quadrant of right breast in female, estrogen receptor positive ([place])   2. Family history of breast cancer     HISTORY OF PRESENT ILLNESS:   Deborah Dougherty, a 76 y.o. female, was seen for a [place] cancer genetics consultation during the breast multidisciplinary clinic at the request of Dr. Lindi Adie due to a personal and family history of cancer.  Deborah Dougherty presents to clinic today to discuss the possibility of a hereditary predisposition to cancer, to discuss genetic testing, and to further clarify her future cancer risks, as well as potential cancer risks for family members.   In January 2023, at the age of 27, Deborah Dougherty was diagnosed with invasive ductal carcinoma of the right breast.  CANCER HISTORY:  Oncology History  Malignant neoplasm of upper-inner quadrant of right breast in female, estrogen receptor positive ([place])  09/14/2021 Initial Diagnosis   Screening mammogram detected right upper medial quadrant mass 1.4 cm spiculated mass by mammogram.  By ultrasound it measured 1 cm axilla negative, biopsy revealed grade 1-2 IDC ER 100%, PR 80%, HER2 negative, Ki-67 10%   09/20/2021 Cancer Staging   Staging form: Breast, AJCC 8th Edition - Clinical stage from 09/20/2021: Stage IA (cT1c, cN0, cM0, G2, ER+, PR+, HER2-) - Signed by Nicholas Lose, MD on 09/20/2021 Stage prefix: Initial diagnosis Histologic grading system: 3 grade system      RISK FACTORS:  Menarche was at age 23.  First live birth: N/A  OCP use for approximately 0 years.  Uterus intact: no.  Menopausal status: postmenopausal.  HRT use: 0 years. Colonoscopy: yes Mammogram within the last year: yes.  Past Medical History:  Diagnosis Date   Anxiety    Depression     Past Surgical History:  Procedure Laterality Date    [place]   TOTAL HIP ARTHROPLASTY Left 12/09/2020   Procedure: TOTAL HIP ARTHROPLASTY ANTERIOR APPROACH;  Surgeon: Rod Can, MD;  Location: [facility];  Service: Orthopedics;  Laterality: Left;   WRIST SURGERY  2010    Social History   Socioeconomic History   Marital status: Widowed    Spouse name: Not on file   Number of children: 1   Years of education: Not on file   Highest education level: Bachelor's degree (e.g., BA, AB, BS)  Occupational History   Not on file  Tobacco Use   Smoking status: Former    Types: Cigarettes    Quit date: 1969    Years since quitting: 54.0   Smokeless tobacco: Never   Tobacco comments:    quit 1969  Substance and Sexual Activity   Alcohol use: Yes    Comment: occasionally, 6 oz daily   Drug use: Never   Sexual activity: Not on file  Other Topics Concern   Not on file  Social History Narrative   Lives alone   Caffeine 10 oz daily   Social Determinants of Health   Financial Resource Strain: Not on file  Food Insecurity: Not on file  Transportation Needs: Not on file  Physical Activity: Not on file  Stress: Not on file  Social Connections: Not on file     FAMILY HISTORY:  We obtained a detailed, 4-generation family history.  Significant diagnoses are listed below: Family History  Problem Relation Age of Onset  Breast cancer Mother 41   Lung cancer Mother        breast cancer metastasized to lung   Stomach cancer Father 63   Mesothelioma Brother      Deborah Dougherty's mother was diagnosed with breast cancer at age 31 and later diagnosed with lung cancer (breast cancer metastasized to the lung), she died at age 5. Her father was diagnosed with stomach cancer at age 85 and died at age 33 due to the stomach cancer. Her paternal grandfather was diagnosed with an unknown cancer, he is deceased. Deborah Dougherty is unaware of previous family history of genetic testing for hereditary cancer risks. There  is no reported Ashkenazi Jewish ancestry.   GENETIC COUNSELING ASSESSMENT: Deborah Dougherty is a 76 y.o. female with a personal and family history of cancer which is somewhat suggestive of a hereditary cancer syndrome and predisposition to cancer. We, therefore, discussed and recommended the following at today's visit.   DISCUSSION: We discussed that 5 - 10% of cancer is hereditary, with most cases of hereditary breast cancer associated with mutations in BRCA1/2.  There are other genes that can be associated with hereditary breast cancer syndromes. Type of cancer risk and level of risk are gene-specific. We discussed that testing is beneficial for several reasons including knowing how to follow individuals after completing their treatment, identifying whether potential treatment options would be beneficial, and understanding if other family members could be at risk for cancer and allowing them to undergo genetic testing.   We reviewed the characteristics, features and inheritance patterns of hereditary cancer syndromes. We also discussed genetic testing, including the appropriate family members to test, the process of testing, insurance coverage and turn-around-time for results. We discussed the implications of a negative, positive and/or variant of uncertain significant result. In order to get genetic test results in a timely manner so that Deborah Dougherty can use these genetic test results for surgical decisions, we recommended Deborah Dougherty pursue genetic testing for the [employer]. Once complete, we recommend Deborah Dougherty pursue reflex genetic testing to a more comprehensive gene panel.   Deborah Dougherty  was offered a common hereditary cancer panel (47 genes) and an expanded pan-cancer panel (77 genes). Deborah Dougherty was informed of the benefits and limitations of each panel, including that expanded pan-cancer panels contain genes that do not have clear management guidelines at this point in time.  We also discussed  that as the number of genes included on a panel increases, the chances of variants of uncertain significance increases.  After considering the benefits and limitations of each gene panel, Deborah Dougherty elected to have [place] Panel+RNA.  The CancerNext-Expanded gene panel offered by [facility] and includes sequencing, rearrangement, and RNA analysis for the following 77 genes: AIP, ALK, APC, ATM, AXIN2, BAP1, BARD1, BLM, BMPR1A, BRCA1, BRCA2, BRIP1, CDC73, CDH1, CDK4, CDKN1B, CDKN2A, CHEK2, CTNNA1, DICER1, FANCC, FH, FLCN, GALNT12, KIF1B, LZTR1, MAX, MEN1, MET, MLH1, MSH2, MSH3, MSH6, MUTYH, NBN, NF1, NF2, NTHL1, PALB2, PHOX2B, PMS2, POT1, PRKAR1A, PTCH1, PTEN, RAD51C, RAD51D, RB1, RECQL, RET, SDHA, SDHAF2, SDHB, SDHC, SDHD, SMAD4, SMARCA4, SMARCB1, SMARCE1, STK11, SUFU, TMEM127, TP53, TSC1, TSC2, VHL and XRCC2 (sequencing and deletion/duplication); EGFR, EGLN1, HOXB13, KIT, MITF, PDGFRA, POLD1, and POLE (sequencing only); EPCAM and GREM1 (deletion/duplication only).    Based on Deborah Dougherty's personal and family history of cancer, she does not meet medical criteria for genetic testing. Despite that she doesn't meet criteria, she is still interested in genetic testing for herself and her  family members. We discussed that if her out of pocket cost for testing is over $100, the laboratory should contact them to discuss self-pay prices, patient pay assistance programs, if applicable, and other billing options.   PLAN: After considering the risks, benefits, and limitations, Deborah Dougherty provided informed consent to pursue genetic testing and the blood sample was sent to [facility] for analysis of the CancerNext-Expanded Panel+RNA. Results should be available within approximately 1-2 weeks' time, at which point they will be disclosed by telephone to Deborah Dougherty, as will any additional recommendations warranted by these results. Deborah Dougherty will receive a summary of her genetic counseling  visit and a copy of her results once available. This information will also be available in Epic.   Deborah Dougherty questions were answered to her satisfaction today. Our contact information was provided should additional questions or concerns arise. Thank you for the referral and allowing [place] to share in the care of your patient.   Lucille Passy, MS, [facility] Genetic Counselor [email or web address] (P) [phone]  The patient was seen for a total of 20 minutes in face-to-face genetic counseling.  The patient brought her sister-in-law. Drs. Lindi Adie and/or Burr Medico were available to discuss this case as needed.  _______________________________________________________________________ For Office Staff:  Number of people involved in session: 2 Was an Intern/ student involved with case: no

## 2021-09-26 ENCOUNTER — Encounter: Payer: Self-pay | Admitting: *Deleted

## 2021-09-26 DIAGNOSIS — C50211 Malignant neoplasm of upper-inner quadrant of right female breast: Secondary | ICD-10-CM

## 2021-09-28 ENCOUNTER — Encounter: Payer: Self-pay | Admitting: *Deleted

## 2021-09-28 ENCOUNTER — Ambulatory Visit
Admission: RE | Admit: 2021-09-28 | Discharge: 2021-09-28 | Disposition: A | Discharge status: Home / Self Care | Payer: Self-pay | Source: Ambulatory Visit | Attending: Radiation Oncology | Admitting: Radiation Oncology

## 2021-09-28 ENCOUNTER — Inpatient Hospital Stay
Admission: RE | Admit: 2021-09-28 | Discharge: 2021-09-28 | Disposition: A | Discharge status: Home / Self Care | Payer: Self-pay | Source: Ambulatory Visit | Attending: Radiation Oncology | Admitting: Radiation Oncology

## 2021-09-28 ENCOUNTER — Other Ambulatory Visit: Payer: Self-pay | Admitting: Radiation Oncology

## 2021-09-28 ENCOUNTER — Telehealth: Payer: Self-pay | Admitting: *Deleted

## 2021-09-28 ENCOUNTER — Telehealth: Payer: Self-pay | Admitting: Hematology and Oncology

## 2021-09-28 DIAGNOSIS — Z170 Estrogen receptor positive status [ER+]: Secondary | ICD-10-CM

## 2021-09-28 DIAGNOSIS — C50211 Malignant neoplasm of upper-inner quadrant of right female breast: Secondary | ICD-10-CM

## 2021-09-28 NOTE — Telephone Encounter (Signed)
Sch per 1/11 inbasket, pt aware °

## 2021-09-28 NOTE — Telephone Encounter (Signed)
Spoke with patient to follow up from [facility] 1/11 and assess navigation needs. Patient denies any questions or concerns at this time. Encouraged her to call should anything should arise.

## 2021-09-29 ENCOUNTER — Encounter: Payer: Self-pay | Admitting: Genetic Counselor

## 2021-09-29 ENCOUNTER — Telehealth: Payer: Self-pay | Admitting: Genetic Counselor

## 2021-09-29 DIAGNOSIS — Z1379 Encounter for other screening for genetic and chromosomal anomalies: Secondary | ICD-10-CM | POA: Insufficient documentation

## 2021-09-29 NOTE — Telephone Encounter (Signed)
I contacted Ms. Blondin to discuss her genetic testing results. No pathogenic variants were identified in the 8 genes analyzed. Of note, we are still waiting on additional genes and will contact her when they are available.   The test report has been scanned into EPIC and is located under the Molecular Pathology section of the Results Review tab.  A portion of the result report is included below for reference.   Lucille Passy, MS, [facility] Genetic Counselor [email or web address] (P) [phone]

## 2021-10-06 ENCOUNTER — Telehealth: Payer: Self-pay | Admitting: Genetic Counselor

## 2021-10-06 NOTE — Telephone Encounter (Signed)
I contacted Ms. Schneider to discuss her genetic testing results. No pathogenic variants were identified in the 77 genes analyzed. Detailed clinic note to follow.  The test report has been scanned into EPIC and is located under the Molecular Pathology section of the Results Review tab.  A portion of the result report is included below for reference.   Lucille Passy, MS, [facility] Genetic Counselor [email or web address] (P) [phone]

## 2021-10-09 ENCOUNTER — Ambulatory Visit: Payer: Self-pay | Admitting: Genetic Counselor

## 2021-10-09 DIAGNOSIS — Z1379 Encounter for other screening for genetic and chromosomal anomalies: Secondary | ICD-10-CM

## 2021-10-09 NOTE — Progress Notes (Signed)
HPI:   Deborah Dougherty. [place]oy[place] was previously seen in the [facility] clinic due to a personal and family history of cancer and concerns regarding a hereditary predisposition to cancer. Please refer to our prior cancer genetics clinic note for more information regarding our discussion, assessment and recommendations, at the time. Deborah Dougherty. Sher recent genetic test results were disclosed to her, as were recommendations warranted by these results. These results and recommendations are discussed in more detail below.  CANCER HISTORY:  Oncology History  Malignant neoplasm of upper-inner quadrant of right breast in female, estrogen receptor positive ([place])  09/14/2021 Initial Diagnosis   Screening mammogram detected right upper medial quadrant mass 1.4 cm spiculated mass by mammogram.  By ultrasound it measured 1 cm axilla negative, biopsy revealed grade 1-2 IDC ER 100%, PR 80%, HER2 negative, Ki-67 10%   09/20/2021 Cancer Staging   Staging form: Breast, AJCC 8th Edition - Clinical stage from 09/20/2021: Stage IA (cT1c, cN0, cM0, G2, ER+, PR+, HER2-) - Signed by Nicholas Lose, MD on 09/20/2021 Stage prefix: Initial diagnosis Histologic grading system: 3 grade system     Genetic Testing   [facility] CancerNext-Expanded is Negative. Report date is 10/02/2021.  The CancerNext-Expanded gene panel offered by [facility] and includes sequencing, rearrangement, and RNA analysis for the following 77 genes: AIP, ALK, APC, ATM, AXIN2, BAP1, BARD1, BLM, BMPR1A, BRCA1, BRCA2, BRIP1, CDC73, CDH1, CDK4, CDKN1B, CDKN2A, CHEK2, CTNNA1, DICER1, FANCC, FH, FLCN, GALNT12, KIF1B, LZTR1, MAX, MEN1, MET, MLH1, MSH2, MSH3, MSH6, MUTYH, NBN, NF1, NF2, NTHL1, PALB2, PHOX2B, PMS2, POT1, PRKAR1A, PTCH1, PTEN, RAD51C, RAD51D, RB1, RECQL, RET, SDHA, SDHAF2, SDHB, SDHC, SDHD, SMAD4, SMARCA4, SMARCB1, SMARCE1, STK11, SUFU, TMEM127, TP53, TSC1, TSC2, VHL and XRCC2 (sequencing and deletion/duplication); EGFR, EGLN1, HOXB13, KIT, MITF,  PDGFRA, POLD1, and POLE (sequencing only); EPCAM and GREM1 (deletion/duplication only).      FAMILY HISTORY:  We obtained a detailed, 4-generation family history.  Significant diagnoses are listed below:      Family History  Problem Relation Age of Onset   Breast cancer Mother 61   Lung cancer Mother          breast cancer metastasized to lung   Stomach cancer Father 30   Mesothelioma Brother         Deborah Dougherty. Hyun's mother was diagnosed with breast cancer at age 11 and later diagnosed with lung cancer (breast cancer metastasized to the lung), she died at age 35. Her father was diagnosed with stomach cancer at age 83 and died at age 44 due to the stomach cancer. Her paternal grandfather was diagnosed with an unknown cancer, he is deceased. Deborah Dougherty. [place] is unaware of previous family history of genetic testing for hereditary cancer risks. There is no reported Ashkenazi Jewish ancestry.   GENETIC TEST RESULTS:  The [facility] CancerNext-Expanded Panel found no pathogenic mutations.   The CancerNext-Expanded gene panel offered by [facility] and includes sequencing, rearrangement, and RNA analysis for the following 77 genes: AIP, ALK, APC, ATM, AXIN2, BAP1, BARD1, BLM, BMPR1A, BRCA1, BRCA2, BRIP1, CDC73, CDH1, CDK4, CDKN1B, CDKN2A, CHEK2, CTNNA1, DICER1, FANCC, FH, FLCN, GALNT12, KIF1B, LZTR1, MAX, MEN1, MET, MLH1, MSH2, MSH3, MSH6, MUTYH, NBN, NF1, NF2, NTHL1, PALB2, PHOX2B, PMS2, POT1, PRKAR1A, PTCH1, PTEN, RAD51C, RAD51D, RB1, RECQL, RET, SDHA, SDHAF2, SDHB, SDHC, SDHD, SMAD4, SMARCA4, SMARCB1, SMARCE1, STK11, SUFU, TMEM127, TP53, TSC1, TSC2, VHL and XRCC2 (sequencing and deletion/duplication); EGFR, EGLN1, HOXB13, KIT, MITF, PDGFRA, POLD1, and POLE (sequencing only); EPCAM and GREM1 (deletion/duplication only).    The test  report has been scanned into EPIC and is located under the Molecular Pathology section of the Results Review tab.  A portion of the result report is included below for reference.  Genetic testing reported out on 10/02/2021.         Even though a pathogenic variant was not identified, possible explanations for her personal history of cancer may include: There may be no hereditary risk for cancer in the family. The cancers in Deborah Dougherty. Cordy and/or her family may be due to other genetic or environmental factors. There may be a gene mutation in one of these genes that current testing methods cannot detect, but that chance is small. There could be another gene that has not yet been discovered, or that we have not yet tested, that is responsible for the cancer diagnoses in the family.   Therefore, it is important to remain in touch with cancer genetics in the future so that we can continue to offer Deborah Dougherty. [place] the most up to date genetic testing.   ADDITIONAL GENETIC TESTING:  We discussed with Deborah Dougherty. [place] that her genetic testing was fairly extensive.  If there are genes identified to increase cancer risk that can be analyzed in the future, we would be happy to discuss and coordinate this testing at that time.    CANCER SCREENING RECOMMENDATIONS:  Deborah Dougherty. [place] test result is considered negative (normal).  This means that we have not identified a hereditary cause for her personal and family history of cancer at this time. Most cancers happen by chance and this negative test suggests that her cancer may fall into this category.    An individual's cancer risk and medical management are not determined by genetic test results alone. Overall cancer risk assessment incorporates additional factors, including personal medical history, family history, and any available genetic information that may result in a personalized plan for cancer prevention and surveillance. Therefore, it is recommended she continue to follow the cancer management and screening guidelines provided by her oncology and primary healthcare provider.  FOLLOW-UP:  Cancer genetics is a rapidly advancing field and it is  possible that new genetic tests will be appropriate for her and/or her family members in the future. We encouraged her to remain in contact with cancer genetics on an annual basis so we can update her personal and family histories and let her know of advances in cancer genetics that may benefit this family.   Our contact number was provided. Deborah Dougherty. Weilbacher questions were answered to her satisfaction, and she knows she is welcome to call us at anytime with additional questions or concerns.   Deborah Passy, Deborah Dougherty, [facility] Genetic Counselor [email or web address] (P) [phone]

## 2021-10-11 ENCOUNTER — Other Ambulatory Visit: Payer: Self-pay

## 2021-10-11 ENCOUNTER — Encounter (HOSPITAL_BASED_OUTPATIENT_CLINIC_OR_DEPARTMENT_OTHER): Payer: Self-pay | Admitting: General Surgery

## 2021-10-16 IMAGING — RF DG C-ARM 1-60 MIN-NO REPORT
1 series · 9 of 9 positions shown · non-contrast
Comparison: December 07, 2020.

CLINICAL DATA: Left hip replacement.

EXAM:
OPERATIVE left HIP (WITH PELVIS IF PERFORMED) 8 VIEWS
TECHNIQUE: Fluoroscopic spot image(s) were submitted for interpretation
post-operatively.
Radiation exposure index: 1.0945 mGy.

[Series 1: unknown protocol · 0.20mm/px · 9 of 9 slices shown]
[im 1/9]
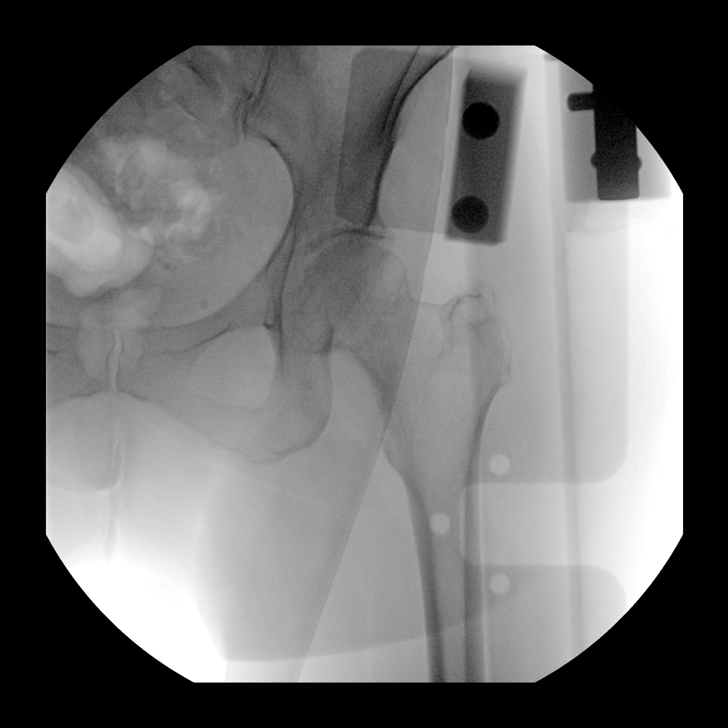
[im 2/9]
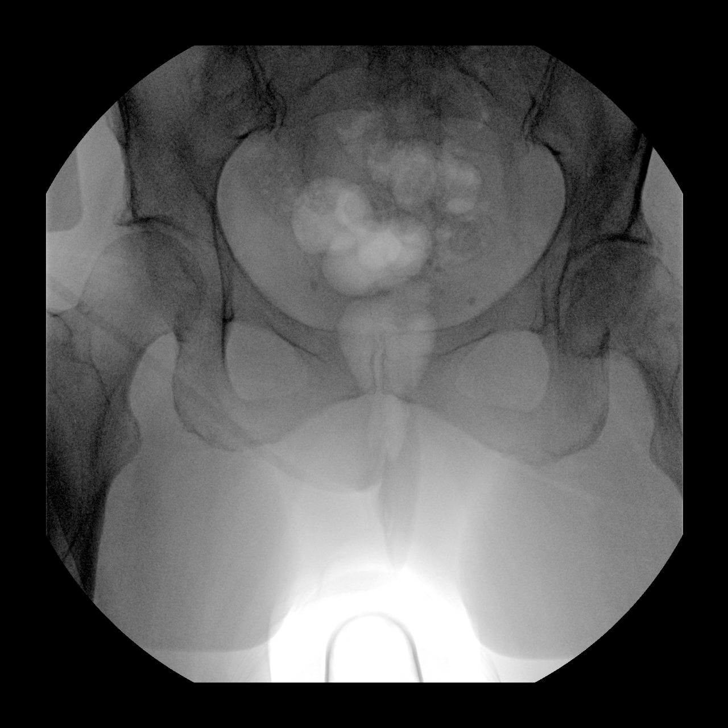
[im 3/9]
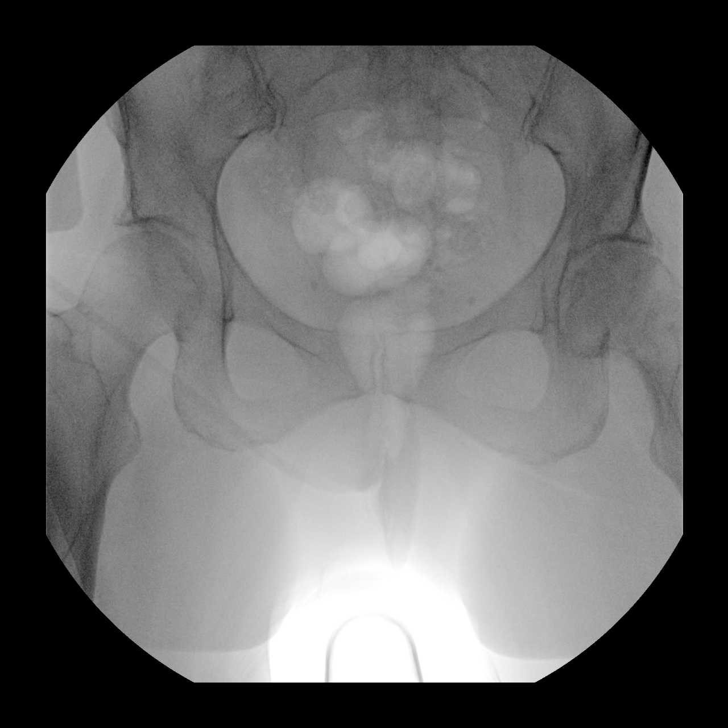
[im 4/9]
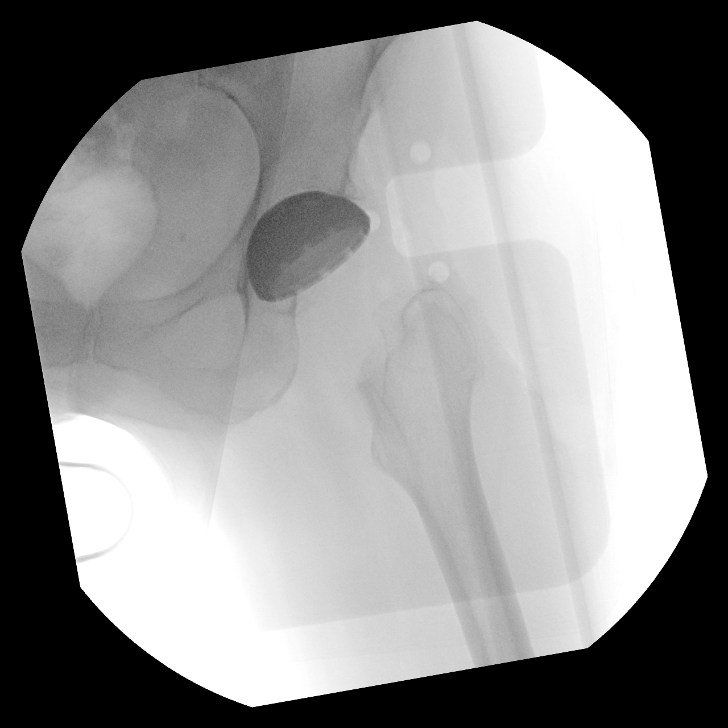
[im 5/9]
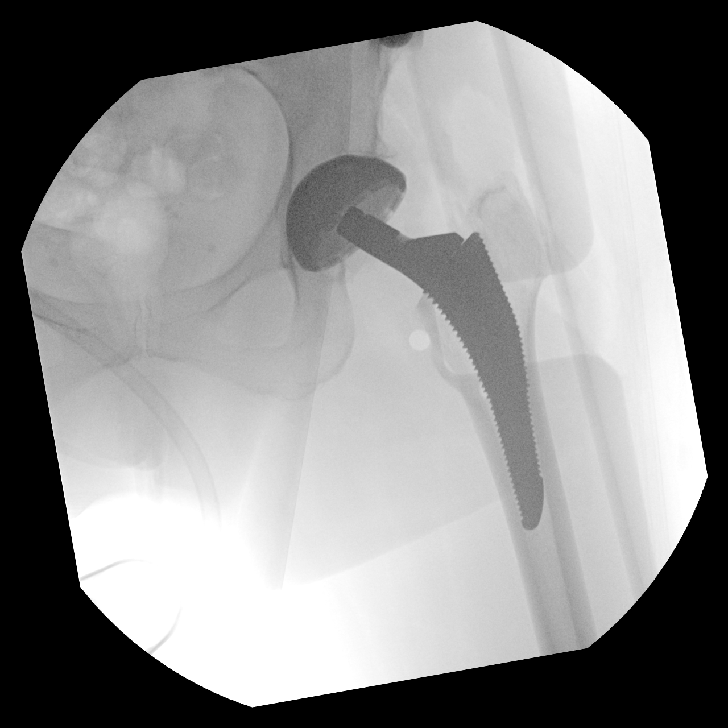
[im 6/9]
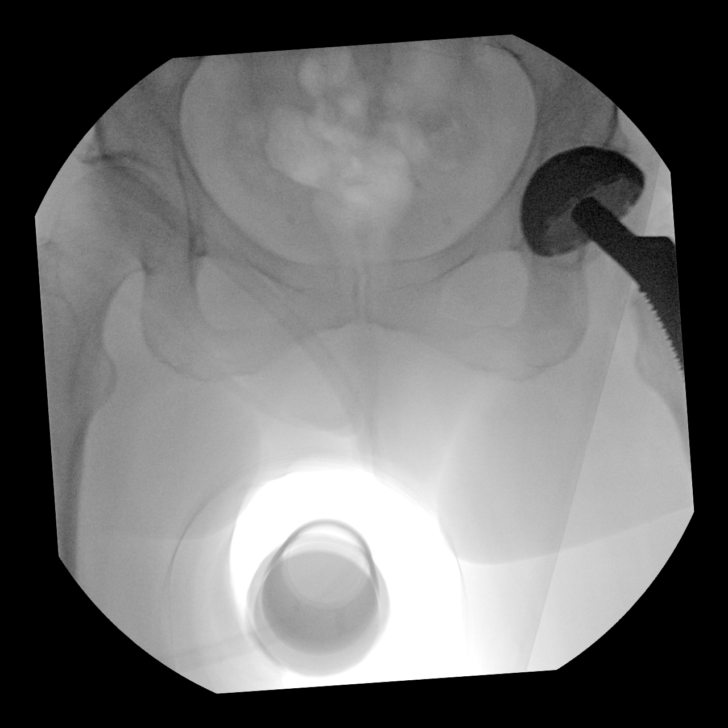
[im 7/9]
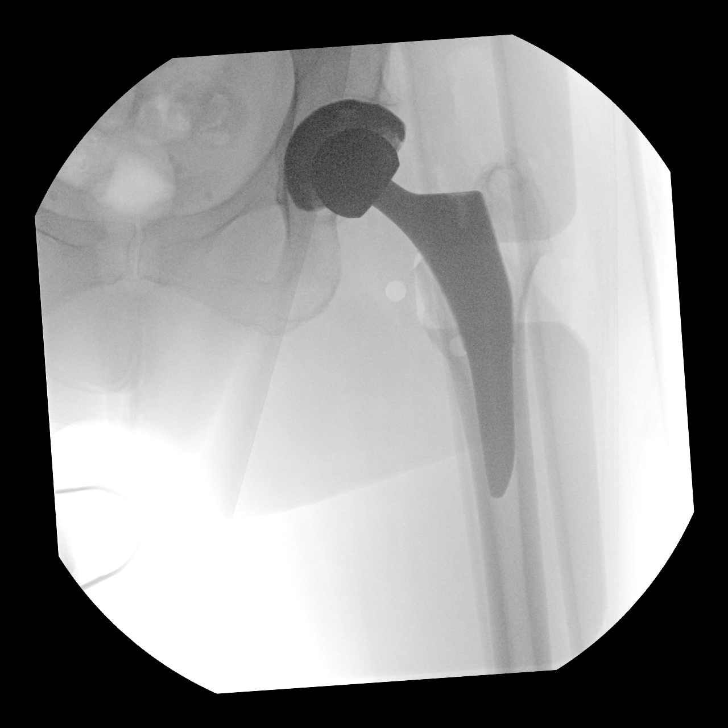
[im 8/9]
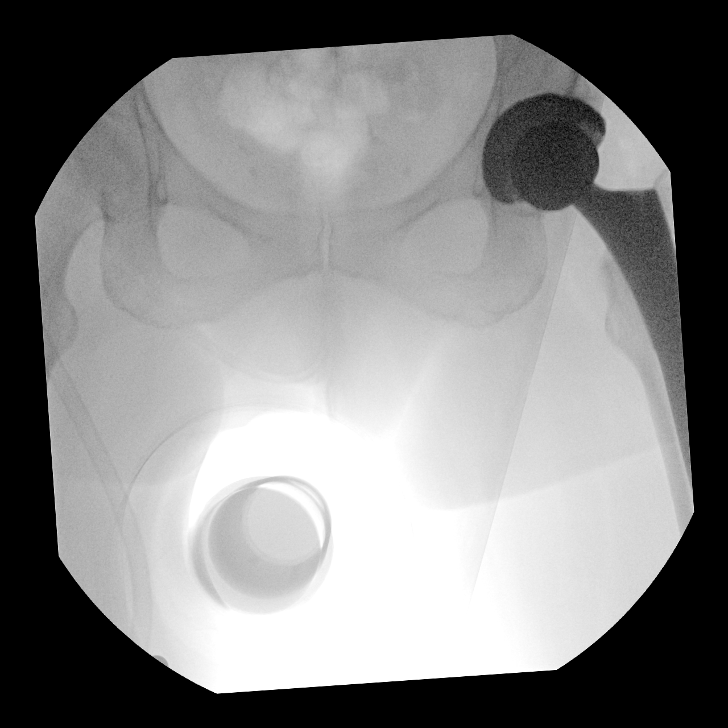
[im 9/9]
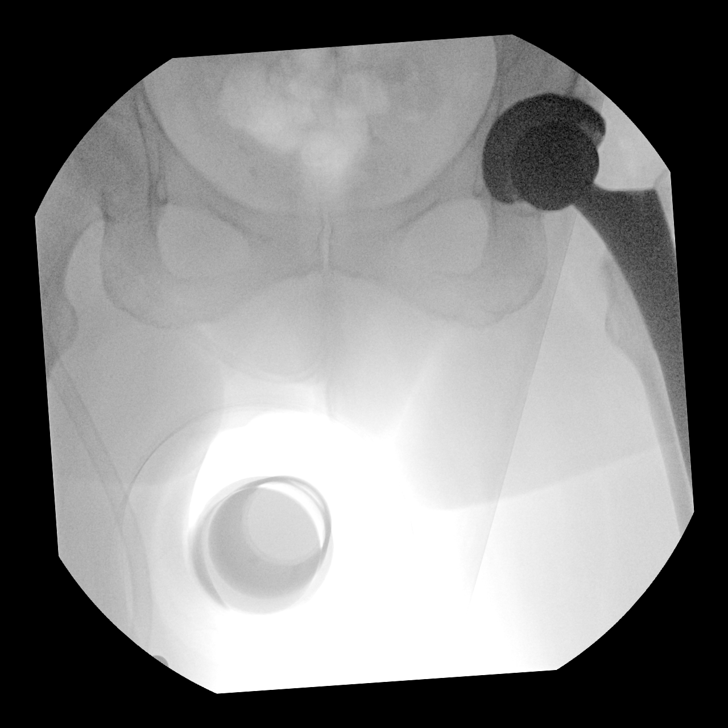

[9 of 9 positions shown; findings below may reference images not displayed]

FINDINGS: Eight intraoperative fluoroscopic images were obtained of the left
hip. The left femoral and acetabular components appear to be well
situated.
IMPRESSION: Fluoroscopic guidance provided during left total hip arthroplasty.

## 2021-10-16 IMAGING — RF DG HIP (WITH PELVIS) OPERATIVE*L*
1 series · 9 of 9 positions shown · non-contrast
Comparison: December 07, 2020.

CLINICAL DATA: Left hip replacement.

EXAM:
OPERATIVE left HIP (WITH PELVIS IF PERFORMED) 8 VIEWS
TECHNIQUE: Fluoroscopic spot image(s) were submitted for interpretation
post-operatively.
Radiation exposure index: 1.0945 mGy.

[Series 1: unknown protocol · 0.20mm/px · 9 of 9 slices shown]
[im 1/9]
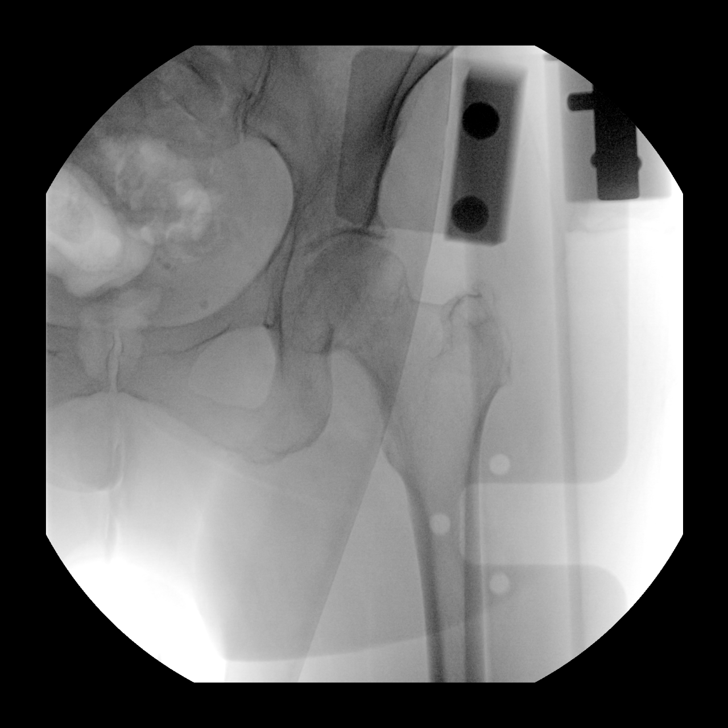
[im 2/9]
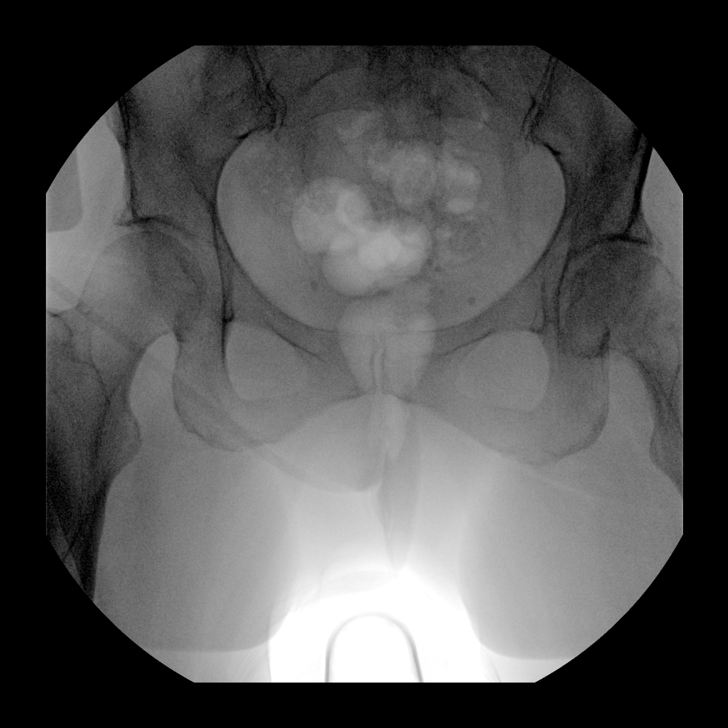
[im 3/9]
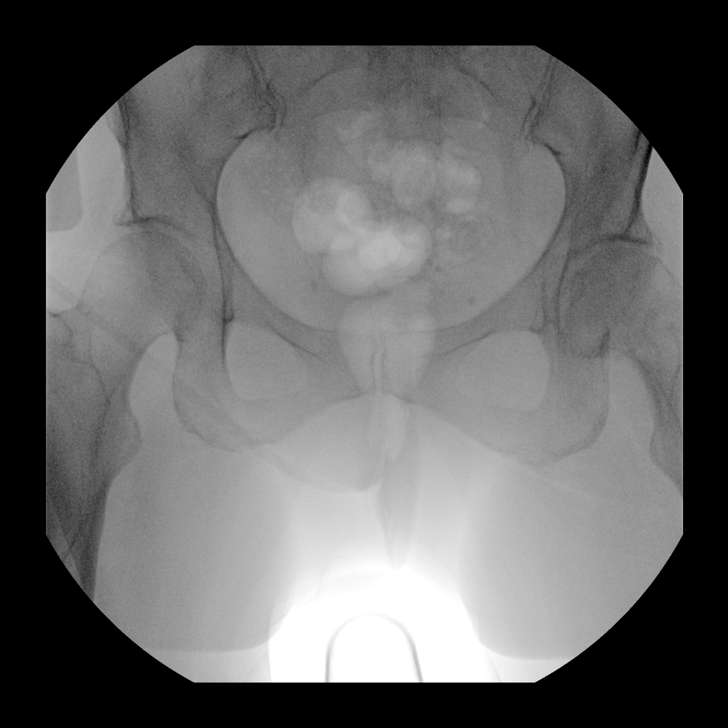
[im 4/9]
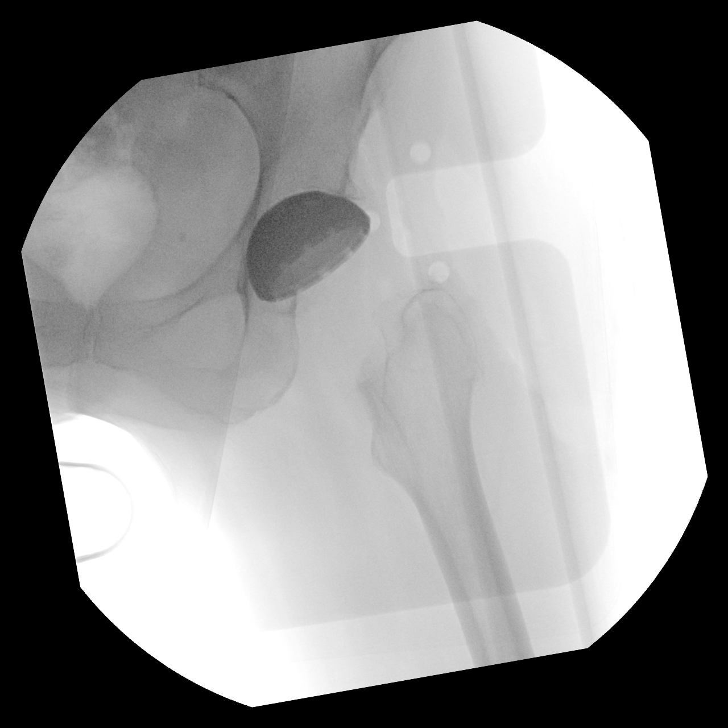
[im 5/9]
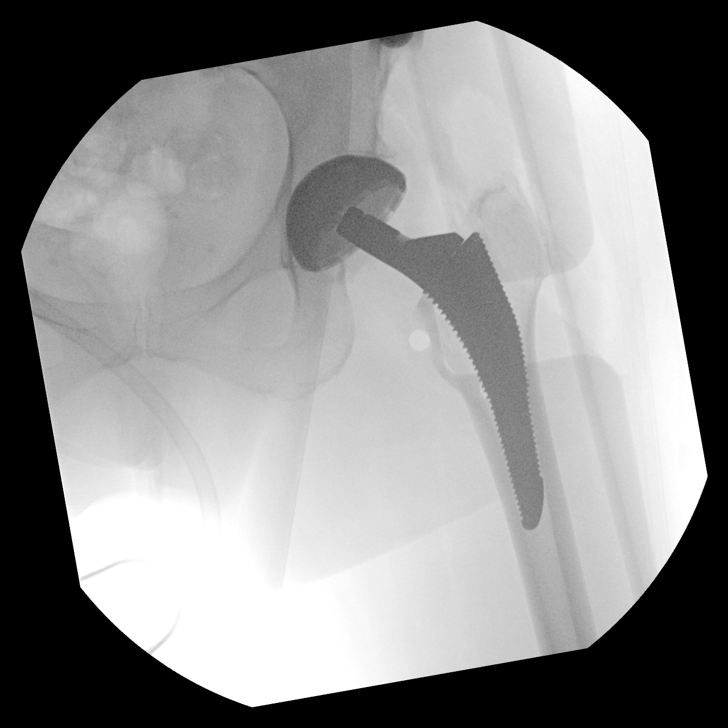
[im 6/9]
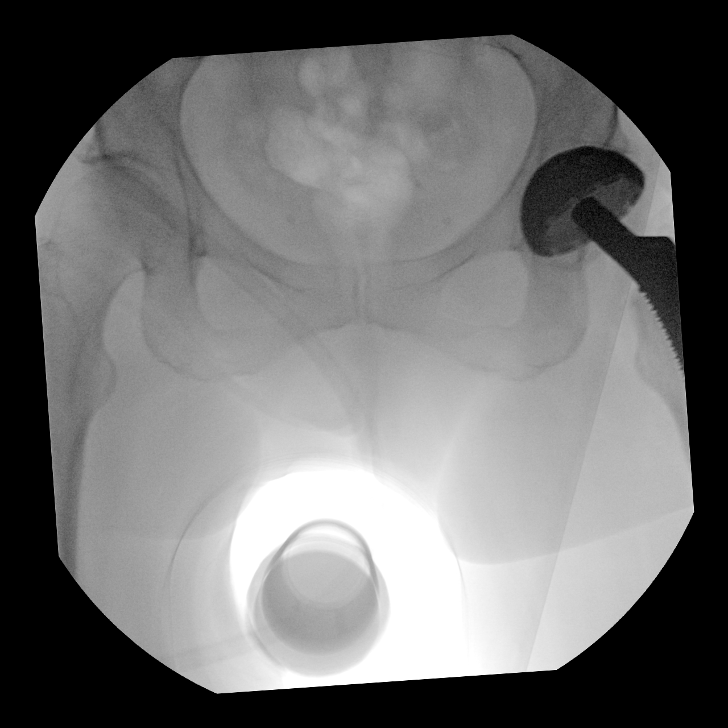
[im 7/9]
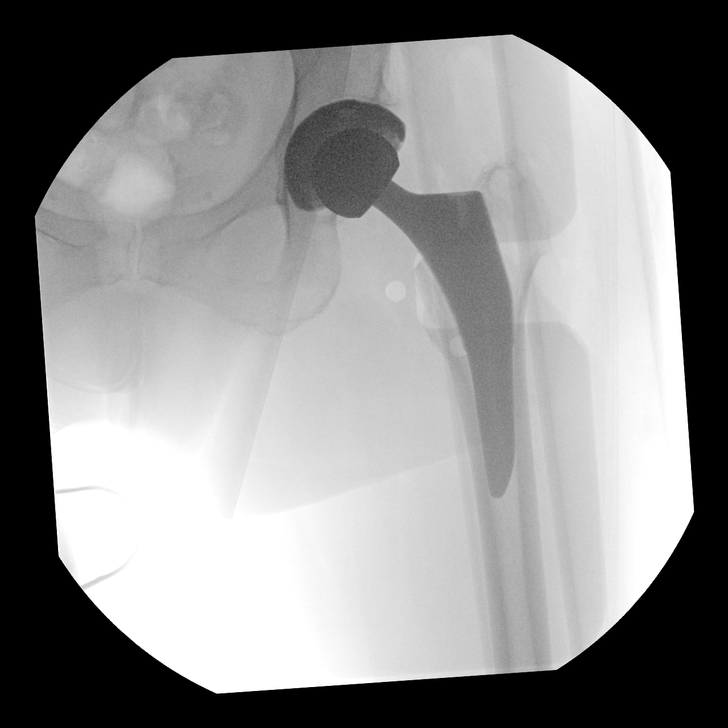
[im 8/9]
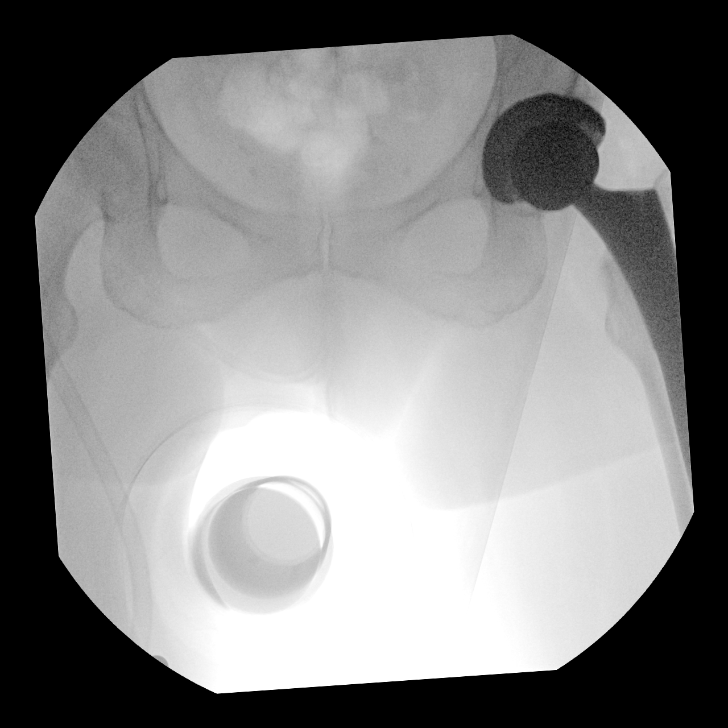
[im 9/9]
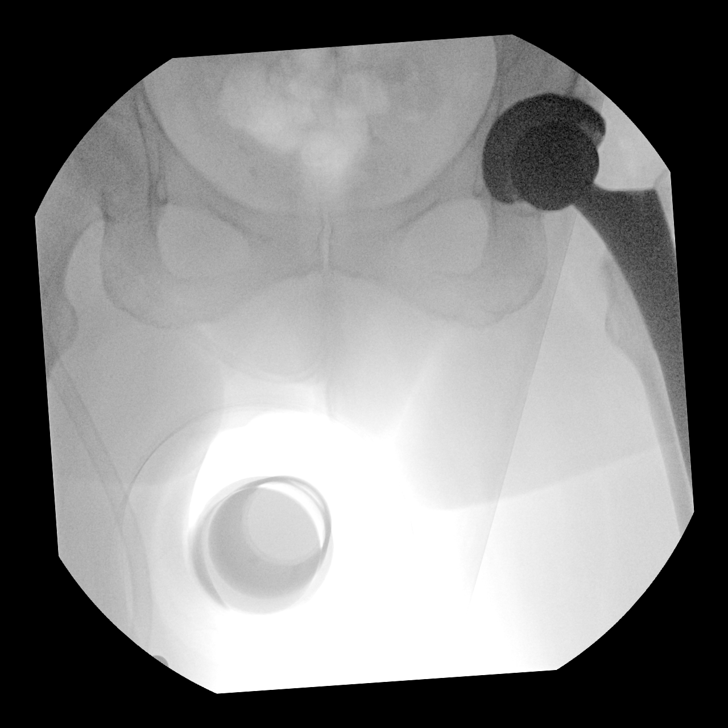

[9 of 9 positions shown; findings below may reference images not displayed]

FINDINGS: Eight intraoperative fluoroscopic images were obtained of the left
hip. The left femoral and acetabular components appear to be well
situated.
IMPRESSION: Fluoroscopic guidance provided during left total hip arthroplasty.

## 2021-10-18 DIAGNOSIS — C50911 Malignant neoplasm of unspecified site of right female breast: Secondary | ICD-10-CM | POA: Diagnosis not present

## 2021-10-18 DIAGNOSIS — E559 Vitamin D deficiency, unspecified: Secondary | ICD-10-CM | POA: Diagnosis not present

## 2021-10-18 DIAGNOSIS — Z79899 Other long term (current) drug therapy: Secondary | ICD-10-CM | POA: Diagnosis not present

## 2021-10-18 DIAGNOSIS — E785 Hyperlipidemia, unspecified: Secondary | ICD-10-CM | POA: Diagnosis not present

## 2021-10-19 DIAGNOSIS — C50211 Malignant neoplasm of upper-inner quadrant of right female breast: Secondary | ICD-10-CM | POA: Diagnosis not present

## 2021-10-19 NOTE — Progress Notes (Signed)
Pt given CHG soap and verbalized understanding of instructions.      Patient Instructions  The night before surgery:  No food after midnight. ONLY clear liquids after midnight  The day of surgery (if you do NOT have diabetes):  Drink ONE (1) Pre-Surgery Clear Ensure as directed.   This drink was given to you during your hospital  pre-op appointment visit. The pre-op nurse will instruct you on the time to drink the  Pre-Surgery Ensure depending on your surgery time. Finish the drink at the designated time by the pre-op nurse.  Nothing else to drink after completing the  Pre-Surgery Clear Ensure.  The day of surgery (if you have diabetes): Drink ONE (1) Gatorade 2 (G2) as directed. This drink was given to you during your hospital  pre-op appointment visit.  The pre-op nurse will instruct you on the time to drink the   Gatorade 2 (G2) depending on your surgery time. Color of the Gatorade may vary. Red is not allowed. Nothing else to drink after completing the  Gatorade 2 (G2).         If you have questions, please contact your surgeons office.

## 2021-10-20 ENCOUNTER — Ambulatory Visit (HOSPITAL_BASED_OUTPATIENT_CLINIC_OR_DEPARTMENT_OTHER): Payer: Medicare PPO | Admitting: Certified Registered"

## 2021-10-20 ENCOUNTER — Encounter (HOSPITAL_BASED_OUTPATIENT_CLINIC_OR_DEPARTMENT_OTHER)
Admission: RE | Disposition: A | Discharge status: Home / Self Care | Payer: Self-pay | Source: Home / Self Care | Attending: General Surgery

## 2021-10-20 ENCOUNTER — Other Ambulatory Visit: Payer: Self-pay

## 2021-10-20 ENCOUNTER — Ambulatory Visit (HOSPITAL_BASED_OUTPATIENT_CLINIC_OR_DEPARTMENT_OTHER)
Admission: RE | Admit: 2021-10-20 | Discharge: 2021-10-20 | Disposition: A | Discharge status: Home / Self Care | Payer: Medicare PPO | Attending: General Surgery | Admitting: General Surgery

## 2021-10-20 ENCOUNTER — Encounter (HOSPITAL_BASED_OUTPATIENT_CLINIC_OR_DEPARTMENT_OTHER): Payer: Self-pay | Admitting: General Surgery

## 2021-10-20 DIAGNOSIS — N641 Fat necrosis of breast: Secondary | ICD-10-CM | POA: Diagnosis not present

## 2021-10-20 DIAGNOSIS — Z800 Family history of malignant neoplasm of digestive organs: Secondary | ICD-10-CM | POA: Insufficient documentation

## 2021-10-20 DIAGNOSIS — C50211 Malignant neoplasm of upper-inner quadrant of right female breast: Secondary | ICD-10-CM | POA: Insufficient documentation

## 2021-10-20 DIAGNOSIS — Z808 Family history of malignant neoplasm of other organs or systems: Secondary | ICD-10-CM | POA: Insufficient documentation

## 2021-10-20 DIAGNOSIS — C50911 Malignant neoplasm of unspecified site of right female breast: Secondary | ICD-10-CM | POA: Diagnosis not present

## 2021-10-20 DIAGNOSIS — F32A Depression, unspecified: Secondary | ICD-10-CM | POA: Insufficient documentation

## 2021-10-20 DIAGNOSIS — F419 Anxiety disorder, unspecified: Secondary | ICD-10-CM | POA: Diagnosis not present

## 2021-10-20 DIAGNOSIS — Z170 Estrogen receptor positive status [ER+]: Secondary | ICD-10-CM | POA: Diagnosis not present

## 2021-10-20 DIAGNOSIS — Z803 Family history of malignant neoplasm of breast: Secondary | ICD-10-CM | POA: Insufficient documentation

## 2021-10-20 DIAGNOSIS — G8918 Other acute postprocedural pain: Secondary | ICD-10-CM | POA: Diagnosis not present

## 2021-10-20 HISTORY — PX: BREAST LUMPECTOMY WITH RADIOACTIVE SEED AND SENTINEL LYMPH NODE BIOPSY: SHX6550

## 2021-10-20 HISTORY — DX: Unspecified osteoarthritis, unspecified site: M19.90

## 2021-10-20 SURGERY — BREAST LUMPECTOMY WITH RADIOACTIVE SEED AND SENTINEL LYMPH NODE BIOPSY
Anesthesia: General | Site: Breast | Laterality: Right

## 2021-10-20 MED ORDER — LIDOCAINE HCL (CARDIAC) PF 100 MG/5ML IV SOSY
PREFILLED_SYRINGE | INTRAVENOUS | Status: DC | PRN
  Administered 2021-10-20: 50 mg via INTRAVENOUS

## 2021-10-20 MED ORDER — OXYCODONE HCL 5 MG PO TABS: 5.0000 mg | ORAL_TABLET | Freq: Once | ORAL | Status: DC | PRN

## 2021-10-20 MED ORDER — DEXAMETHASONE SODIUM PHOSPHATE 10 MG/ML IJ SOLN
INTRAMUSCULAR | Status: DC | PRN
  Administered 2021-10-20: 8 mg via INTRAVENOUS

## 2021-10-20 MED ORDER — ONDANSETRON HCL 4 MG/2ML IJ SOLN
INTRAMUSCULAR | Status: DC | PRN
  Administered 2021-10-20: 4 mg via INTRAVENOUS

## 2021-10-20 MED ORDER — OXYCODONE HCL 5 MG/5ML PO SOLN: 5.0000 mg | Freq: Once | ORAL | Status: DC | PRN

## 2021-10-20 MED ORDER — FENTANYL CITRATE (PF) 100 MCG/2ML IJ SOLN
INTRAMUSCULAR | Status: AC
  Filled 2021-10-20: qty 2

## 2021-10-20 MED ORDER — LIDOCAINE 2% (20 MG/ML) 5 ML SYRINGE
INTRAMUSCULAR | Status: AC
  Filled 2021-10-20: qty 5

## 2021-10-20 MED ORDER — MAGTRACE LYMPHATIC TRACER
INTRAMUSCULAR | Status: DC | PRN
  Administered 2021-10-20: 2 mL via INTRAMUSCULAR

## 2021-10-20 MED ORDER — CHLORHEXIDINE GLUCONATE CLOTH 2 % EX PADS: 6.0000 | MEDICATED_PAD | Freq: Once | CUTANEOUS | Status: DC

## 2021-10-20 MED ORDER — ACETAMINOPHEN 500 MG PO TABS
1000.0000 mg | ORAL_TABLET | ORAL | Status: AC
  Administered 2021-10-20: 1000 mg via ORAL

## 2021-10-20 MED ORDER — BUPIVACAINE-EPINEPHRINE (PF) 0.5% -1:200000 IJ SOLN
INTRAMUSCULAR | Status: DC | PRN
  Administered 2021-10-20: 25 mL via PERINEURAL

## 2021-10-20 MED ORDER — DEXAMETHASONE SODIUM PHOSPHATE 10 MG/ML IJ SOLN
INTRAMUSCULAR | Status: AC
  Filled 2021-10-20: qty 1

## 2021-10-20 MED ORDER — GABAPENTIN 100 MG PO CAPS
ORAL_CAPSULE | ORAL | Status: AC
  Filled 2021-10-20: qty 1

## 2021-10-20 MED ORDER — LACTATED RINGERS IV SOLN: INTRAVENOUS | Status: DC

## 2021-10-20 MED ORDER — EPHEDRINE 5 MG/ML INJ
INTRAVENOUS | Status: AC
  Filled 2021-10-20: qty 5

## 2021-10-20 MED ORDER — VANCOMYCIN HCL IN DEXTROSE 1-5 GM/200ML-% IV SOLN
INTRAVENOUS | Status: AC
  Filled 2021-10-20: qty 200

## 2021-10-20 MED ORDER — OXYCODONE HCL 5 MG PO TABS: 5.0000 mg | ORAL_TABLET | Freq: Four times a day (QID) | ORAL | 0 refills | Status: DC | PRN

## 2021-10-20 MED ORDER — ACETAMINOPHEN 500 MG PO TABS
ORAL_TABLET | ORAL | Status: AC
  Filled 2021-10-20: qty 2

## 2021-10-20 MED ORDER — PROPOFOL 10 MG/ML IV BOLUS
INTRAVENOUS | Status: AC
  Filled 2021-10-20: qty 20

## 2021-10-20 MED ORDER — FENTANYL CITRATE (PF) 100 MCG/2ML IJ SOLN
50.0000 ug | Freq: Once | INTRAMUSCULAR | Status: AC
  Administered 2021-10-20: 50 ug via INTRAVENOUS

## 2021-10-20 MED ORDER — MIDAZOLAM HCL 2 MG/2ML IJ SOLN
INTRAMUSCULAR | Status: AC
  Filled 2021-10-20: qty 2

## 2021-10-20 MED ORDER — 0.9 % SODIUM CHLORIDE (POUR BTL) OPTIME
TOPICAL | Status: DC | PRN
  Administered 2021-10-20: 60 mL

## 2021-10-20 MED ORDER — EPHEDRINE SULFATE (PRESSORS) 50 MG/ML IJ SOLN
INTRAMUSCULAR | Status: DC | PRN
  Administered 2021-10-20: 5 mg via INTRAVENOUS
  Administered 2021-10-20: 10 mg via INTRAVENOUS
  Administered 2021-10-20: 5 mg via INTRAVENOUS

## 2021-10-20 MED ORDER — FENTANYL CITRATE (PF) 100 MCG/2ML IJ SOLN: 25.0000 ug | INTRAMUSCULAR | Status: DC | PRN

## 2021-10-20 MED ORDER — PROPOFOL 10 MG/ML IV BOLUS
INTRAVENOUS | Status: DC | PRN
  Administered 2021-10-20: 150 mg via INTRAVENOUS

## 2021-10-20 MED ORDER — CELECOXIB 100 MG PO CAPS
ORAL_CAPSULE | ORAL | Status: AC
  Filled 2021-10-20: qty 1

## 2021-10-20 MED ORDER — ONDANSETRON HCL 4 MG/2ML IJ SOLN: 4.0000 mg | Freq: Four times a day (QID) | INTRAMUSCULAR | Status: DC | PRN

## 2021-10-20 MED ORDER — VANCOMYCIN HCL IN DEXTROSE 1-5 GM/200ML-% IV SOLN
1000.0000 mg | INTRAVENOUS | Status: AC
  Administered 2021-10-20: 1000 mg via INTRAVENOUS

## 2021-10-20 MED ORDER — BUPIVACAINE-EPINEPHRINE 0.25% -1:200000 IJ SOLN
INTRAMUSCULAR | Status: DC | PRN
  Administered 2021-10-20: 15 mL

## 2021-10-20 MED ORDER — ONDANSETRON HCL 4 MG/2ML IJ SOLN
INTRAMUSCULAR | Status: AC
  Filled 2021-10-20: qty 2

## 2021-10-20 MED ORDER — GABAPENTIN 100 MG PO CAPS
100.0000 mg | ORAL_CAPSULE | ORAL | Status: AC
  Administered 2021-10-20: 100 mg via ORAL

## 2021-10-20 MED ORDER — CELECOXIB 100 MG PO CAPS
100.0000 mg | ORAL_CAPSULE | ORAL | Status: AC
  Administered 2021-10-20: 100 mg via ORAL

## 2021-10-20 MED ORDER — FENTANYL CITRATE (PF) 100 MCG/2ML IJ SOLN
INTRAMUSCULAR | Status: DC | PRN
  Administered 2021-10-20: 25 ug via INTRAVENOUS

## 2021-10-20 SURGICAL SUPPLY — 39 items
ADH SKN CLS APL DERMABOND .7 (GAUZE/BANDAGES/DRESSINGS) ×1
APL PRP STRL LF DISP 70% ISPRP (MISCELLANEOUS) ×1
APPLIER CLIP 9.375 MED OPEN (MISCELLANEOUS) ×2
APR CLP MED 9.3 20 MLT OPN (MISCELLANEOUS) ×1
BINDER BREAST LRG (GAUZE/BANDAGES/DRESSINGS) ×1 IMPLANT
BLADE SURG 15 STRL LF DISP TIS (BLADE) ×1 IMPLANT
BLADE SURG 15 STRL SS (BLADE) ×2
CANISTER SUCT 1200ML W/VALVE (MISCELLANEOUS) ×1 IMPLANT
CHLORAPREP W/TINT 26 (MISCELLANEOUS) ×2 IMPLANT
CLIP APPLIE 9.375 MED OPEN (MISCELLANEOUS) ×1 IMPLANT
COVER BACK TABLE 60X90IN (DRAPES) ×2 IMPLANT
COVER MAYO STAND STRL (DRAPES) ×2 IMPLANT
COVER PROBE W GEL 5X96 (DRAPES) ×3 IMPLANT
DERMABOND ADVANCED (GAUZE/BANDAGES/DRESSINGS) ×1
DERMABOND ADVANCED .7 DNX12 (GAUZE/BANDAGES/DRESSINGS) ×1 IMPLANT
DRAPE LAPAROSCOPIC ABDOMINAL (DRAPES) ×2 IMPLANT
DRAPE UTILITY XL STRL (DRAPES) ×2 IMPLANT
ELECT COATED BLADE 2.86 ST (ELECTRODE) ×2 IMPLANT
ELECT REM PT RETURN 9FT ADLT (ELECTROSURGICAL) ×2
ELECTRODE REM PT RTRN 9FT ADLT (ELECTROSURGICAL) ×1 IMPLANT
GLOVE SURG ENC MOIS LTX SZ7.5 (GLOVE) ×2 IMPLANT
GOWN STRL REUS W/ TWL LRG LVL3 (GOWN DISPOSABLE) ×2 IMPLANT
GOWN STRL REUS W/TWL LRG LVL3 (GOWN DISPOSABLE) ×4
KIT MARKER MARGIN INK (KITS) ×2 IMPLANT
NDL HYPO 25X1 1.5 SAFETY (NEEDLE) ×1 IMPLANT
NEEDLE HYPO 25X1 1.5 SAFETY (NEEDLE) ×2 IMPLANT
NS IRRIG 1000ML POUR BTL (IV SOLUTION) ×1 IMPLANT
PACK BASIN DAY SURGERY FS (CUSTOM PROCEDURE TRAY) ×2 IMPLANT
PENCIL SMOKE EVACUATOR (MISCELLANEOUS) ×2 IMPLANT
SLEEVE SCD COMPRESS KNEE MED (STOCKING) ×2 IMPLANT
SPONGE T-LAP 18X18 ~~LOC~~+RFID (SPONGE) ×2 IMPLANT
SUT MON AB 4-0 PC3 18 (SUTURE) ×4 IMPLANT
SUT VICRYL 3-0 CR8 SH (SUTURE) ×2 IMPLANT
SYR CONTROL 10ML LL (SYRINGE) ×2 IMPLANT
TOWEL GREEN STERILE FF (TOWEL DISPOSABLE) ×2 IMPLANT
TRACER MAGTRACE VIAL (MISCELLANEOUS) ×1 IMPLANT
TRAY FAXITRON CT DISP (TRAY / TRAY PROCEDURE) ×2 IMPLANT
TUBE CONNECTING 20X1/4 (TUBING) ×1 IMPLANT
YANKAUER SUCT BULB TIP NO VENT (SUCTIONS) ×1 IMPLANT

## 2021-10-20 NOTE — Anesthesia Postprocedure Evaluation (Signed)
Anesthesia Post Note  Patient: Deborah Dougherty  Procedure(s) Performed: RIGHT BREAST LUMPECTOMY WITH RADIOACTIVE SEED AND SENTINEL LYMPH NODE BIOPSY (Right: Breast)     Patient location during evaluation: PACU Anesthesia Type: General Level of consciousness: awake and alert Pain management: pain level controlled Vital Signs Assessment: post-procedure vital signs reviewed and stable Respiratory status: spontaneous breathing, nonlabored ventilation, respiratory function stable and patient connected to nasal cannula oxygen Cardiovascular status: blood pressure returned to baseline and stable Postop Assessment: no apparent nausea or vomiting Anesthetic complications: no   No notable events documented.  Last Vitals:  Vitals:   10/20/21 1330 10/20/21 1425  BP: 120/68 127/65  Pulse: 82 83  Resp: 17 16  Temp:  36.8 C  SpO2: 95% 96%    Last Pain:  Vitals:   10/20/21 1341  TempSrc:   PainSc: 0-No pain                 [name] S

## 2021-10-20 NOTE — Interval H&P Note (Signed)
History and Physical Interval Note:  10/20/2021 11:22 AM  Deborah Dougherty  has presented today for surgery, with the diagnosis of RIGHT BREAST CANCER.  The various methods of treatment have been discussed with the patient and family. After consideration of risks, benefits and other options for treatment, the patient has consented to  Procedure(s): RIGHT BREAST LUMPECTOMY WITH RADIOACTIVE SEED AND SENTINEL LYMPH NODE BIOPSY (Right) as a surgical intervention.  The patient's history has been reviewed, patient examined, no change in status, stable for surgery.  I have reviewed the patient's chart and labs.  Questions were answered to the patient's satisfaction.     [name]

## 2021-10-20 NOTE — Discharge Instructions (Addendum)
°  Post Anesthesia Home Care Instructions  Activity: Get plenty of rest for the remainder of the day. A responsible individual must stay with you for 24 hours following the procedure.  For the next 24 hours, DO NOT: -Drive a car -Paediatric nurse -Drink alcoholic beverages -Take any medication unless instructed by your physician -Make any legal decisions or sign important papers.  Meals: Start with liquid foods such as gelatin or soup. Progress to regular foods as tolerated. Avoid greasy, spicy, heavy foods. If nausea and/or vomiting occur, drink only clear liquids until the nausea and/or vomiting subsides. Call your physician if vomiting continues.  Special Instructions/Symptoms: Your throat may feel dry or sore from the anesthesia or the breathing tube placed in your throat during surgery. If this causes discomfort, gargle with warm salt water. The discomfort should disappear within 24 hours.  If you had a scopolamine patch placed behind your ear for the management of post- operative nausea and/or vomiting:  1. The medication in the patch is effective for 72 hours, after which it should be removed.  Wrap patch in a tissue and discard in the trash. Wash hands thoroughly with soap and water. 2. You may remove the patch earlier than 72 hours if you experience unpleasant side effects which may include dry mouth, dizziness or visual disturbances. 3. Avoid touching the patch. Wash your hands with soap and water after contact with the patch.        Next dose of Tylenol can be given after 4:40 PM. Next dose of NSAID (Ibuprofen, Motrin, Aleve) can be given after 4:40 PM.

## 2021-10-20 NOTE — Progress Notes (Signed)
  Assisted Dr. Hodierne with right, ultrasound guided, pectoralis block. Side rails up, monitors on throughout procedure. See vital signs in flow sheet. Tolerated Procedure well. 

## 2021-10-20 NOTE — Transfer of Care (Signed)
Immediate Anesthesia Transfer of Care Note  Patient: Deborah Dougherty  Procedure(s) Performed: RIGHT BREAST LUMPECTOMY WITH RADIOACTIVE SEED AND SENTINEL LYMPH NODE BIOPSY (Right: Breast)  Patient Location: PACU  Anesthesia Type:GA combined with regional for post-op pain  Dougherty of Consciousness: awake, alert  and oriented  Airway & Oxygen Therapy: Patient Spontanous Breathing and Patient connected to face mask oxygen  Post-op Assessment: Report given to RN and Post -op Vital signs reviewed and stable  Post vital signs: Reviewed and stable  Last Vitals:  Vitals Value Taken Time  BP 92/81 10/20/21 1302  Temp    Pulse 92 10/20/21 1303  Resp 14 10/20/21 1303  SpO2 100 % 10/20/21 1303  Vitals shown include unvalidated device data.  Last Pain:  Vitals:   10/20/21 1036  TempSrc: Oral  PainSc: 0-No pain         Complications: No notable events documented.

## 2021-10-20 NOTE — Anesthesia Preprocedure Evaluation (Signed)
Anesthesia Evaluation  Patient identified by MRN, date of birth, ID band Patient awake    Reviewed: Allergy & Precautions, H&P , NPO status , Patient's Chart, lab work & pertinent test results  Airway Mallampati: II   Neck ROM: full    Dental   Pulmonary former smoker,    breath sounds clear to auscultation       Cardiovascular negative cardio ROS   Rhythm:regular Rate:Normal     Neuro/Psych PSYCHIATRIC DISORDERS Anxiety Depression    GI/Hepatic   Endo/Other    Renal/GU      Musculoskeletal  (+) Arthritis ,   Abdominal   Peds  Hematology   Anesthesia Other Findings   Reproductive/Obstetrics Breast CA                             Anesthesia Physical Anesthesia Plan  ASA: 2  Anesthesia Plan: General   Post-op Pain Management: Regional block   Induction: Intravenous  PONV Risk Score and Plan: 3 and Ondansetron, Dexamethasone and Treatment may vary due to age or medical condition  Airway Management Planned: LMA  Additional Equipment:   Intra-op Plan:   Post-operative Plan: Extubation in OR  Informed Consent: I have reviewed the patients History and Physical, chart, labs and discussed the procedure including the risks, benefits and alternatives for the proposed anesthesia with the patient or authorized representative who has indicated his/her understanding and acceptance.     Dental advisory given  Plan Discussed with: CRNA, Anesthesiologist and Surgeon  Anesthesia Plan Comments:         Anesthesia Quick Evaluation

## 2021-10-20 NOTE — Anesthesia Procedure Notes (Signed)
Anesthesia Regional Block: Pectoralis block   Pre-Anesthetic Checklist: , timeout performed,  Correct Patient, Correct Site, Correct Laterality,  Correct Procedure, Correct Position, site marked,  Risks and benefits discussed,  Surgical consent,  Pre-op evaluation,  At surgeon's request and post-op pain management  Laterality: Right  Prep: chloraprep       Needles:  Injection technique: Single-shot  Needle Type: Echogenic Needle     Needle Length: 9cm  Needle Gauge: 21     Additional Needles:   Narrative:  Start time: 10/20/2021 11:25 AM End time: 10/20/2021 11:35 AM Injection made incrementally with aspirations every 5 mL.  Performed by: Personally  Anesthesiologist: Albertha Ghee, MD  Additional Notes: Pt tolerated the procedure well.

## 2021-10-20 NOTE — Anesthesia Procedure Notes (Signed)
Procedure Name: LMA Insertion Date/Time: 10/20/2021 11:46 AM Performed by: Albertha Ghee, MD Pre-anesthesia Checklist: Patient identified, Emergency Drugs available, Suction available and Patient being monitored Patient Re-evaluated:Patient Re-evaluated prior to induction Oxygen Delivery Method: Circle System Utilized Preoxygenation: Pre-oxygenation with 100% oxygen Induction Type: IV induction Ventilation: Mask ventilation without difficulty LMA: LMA inserted LMA Size: 4.0 Number of attempts: 1 Airway Equipment and Method: Bite block Placement Confirmation: positive ETCO2 Tube secured with: Tape Dental Injury: Teeth and Oropharynx as per pre-operative assessment

## 2021-10-20 NOTE — Op Note (Signed)
10/20/2021  12:51 PM  PATIENT:  Deborah Dougherty  76 y.o. female  PRE-OPERATIVE DIAGNOSIS:  RIGHT BREAST CANCER  POST-OPERATIVE DIAGNOSIS:  RIGHT BREAST CANCER  PROCEDURE:  Procedure(s): RIGHT BREAST LUMPECTOMY WITH RADIOACTIVE SEED LOCALIZATION AND DEEP RIGHT AXILLARY SENTINEL LYMPH NODE BIOPSY (Right)  SURGEON:  Surgeon(s) and Role:    * Jovita Kussmaul, MD - Primary  PHYSICIAN ASSISTANT:   ASSISTANTS: none   ANESTHESIA:   local and general  EBL:  10 mL   BLOOD ADMINISTERED:none  DRAINS: none   LOCAL MEDICATIONS USED:  MARCAINE     SPECIMEN:  Source of Specimen:  right breast tissue and sentinel node  DISPOSITION OF SPECIMEN:  PATHOLOGY  COUNTS:  YES  TOURNIQUET:  * No tourniquets in log *  DICTATION: .Dragon Dictation  After informed consent was obtained the patient was brought to the operating room and placed in the supine position on the operating table.  After adequate induction of general anesthesia the patient's right chest, breast, and axillary area were prepped with ChloraPrep, allowed to dry, and draped in usual sterile manner.  An appropriate timeout was performed.  2 cc of iron oxide were then injected into the subareolar plexus on the right and the breast was massaged for 5 minutes.  Previously an I-125 seed was placed in the upper inner quadrant of the right breast to mark an area of invasive breast cancer.  The mag trace was used initially to identify a signal in the left axilla.  The area overlying this was infiltrated with quarter percent Marcaine.  A small transversely oriented incision was made with a 15 blade knife overlying the area of signal.  The incision was carried through the skin and subcutaneous tissue sharply with the electrocautery until the deep right axillary space was entered.  The mag trace was used to identify the signal.  I was able to dissect this out sharply with the electrocautery and the surrounding small vessels and lymphatics were  controlled with clips.  Once this was removed it was sent as sentinel node #1.  No other hot or palpable nodes were identified in the right axilla.  I suspect that the specimen had more than 1 lymph node in it.  Hemostasis was achieved using the Bovie electrocautery.  The deep layer of the incision was closed with interrupted 3-0 Vicryl stitches.  The skin was closed with a running 4-0 Monocryl subcuticular stitch.  Attention was then turned to the right breast.  The neoprobe was set to I-125 in the area of radioactivity was readily identified far in the upper inner quadrant of the right breast.  Because of its far location from the nipple and areola I elected to make an elliptical incision in the skin overlying the area of radioactivity with a 15 blade knife.  The incision was carried through the skin and subcutaneous tissue sharply with the electrocautery.  Dissection was then carried around the radioactive seed while checking the area of radioactivity frequently.  This dissection was carried all the way to the chest wall.  Once this was accomplished the specimen was removed from the patient.  It was oriented with the appropriate paint colors.  A specimen radiograph was obtained that showed the clip and seed to be near the center of the specimen.  The specimen was then sent to pathology for further evaluation.  Hemostasis was achieved using the Bovie electrocautery.  The cavity was marked with clips.  The wound was irrigated with saline and  infiltrated with more quarter percent Marcaine.  The deep layer of the wound was then closed with interrupted 3-0 Vicryl stitches.  The skin was then closed with a running 4-0 Monocryl subcuticular stitch.  Dermabond dressings were applied.  The patient tolerated the procedure well.  At the end of the case all needle sponge and instrument counts were correct.  The patient was then awakened and taken to recovery in stable condition.  PLAN OF CARE: Discharge to home after  PACU  PATIENT DISPOSITION:  PACU - hemodynamically stable.   Delay start of Pharmacological VTE agent (>24hrs) due to surgical blood loss or risk of bleeding: not applicable

## 2021-10-20 NOTE — H&P (Signed)
PROVIDER: Landry Corporal, MD  MRN: [id number] DOB: [birth year] Subjective   Chief Complaint: Breast Cancer   History of Present Illness: Deborah Dougherty is a 76 y.o. female who is seen today as an office consultation at the request of Dr. Pasty Arch for evaluation of Breast Cancer .   We are asked to see the patient in consultation by Dr. Lindi Adie to evaluate her for a new right breast cancer. The patient is a 76 year old white female who recently went for a routine screening mammogram. At that time she was found to have a 1.4 cm mass in the upper inner quadrant of the right breast. The axilla looked negative. The mass was biopsied and came back as a grade 2 invasive ductal cancer that was ER and PR positive and HER2 negative with a Ki-67 of 10%. She is otherwise in good health and does not smoke. She does have family history of breast cancer in her mother, stomach cancer in her father, and mesothelioma in her brother.  Review of Systems: A complete review of systems was obtained from the patient. I have reviewed this information and discussed as appropriate with the patient. See HPI as well for other ROS.  ROS   Medical History: Past Medical History:  Diagnosis Date   Anxiety   Arthritis   History of cancer   Hypertension   Patient Active Problem List  Diagnosis   Malignant neoplasm of upper-inner quadrant of right breast in female, estrogen receptor positive (CMS-HCC)   Osteoarthritis   Past Surgical History:  Procedure Laterality Date   ARTHROPLASTY HIP TOTAL Left 12/09/2020   HYSTERECTOMY N/A  1998   TONSILLECTOMY N/A  1995   wrist surgery N/A  2010    Allergies  Allergen Reactions   Cefuroxime Axetil Other (See Comments) and Nausea And Vomiting   Current Outpatient Medications on File Prior to Visit  Medication Sig Dispense Refill   PARoxetine (PAXIL) 20 MG tablet Take 1 tablet by mouth once daily   No current facility-administered medications on file prior to visit.    Family History  Problem Relation Age of Onset   Breast cancer Mother    Social History   Tobacco Use  Smoking Status Never  Smokeless Tobacco Never    Social History   Socioeconomic History   Marital status: Unknown  Tobacco Use   Smoking status: Never   Smokeless tobacco: Never  Vaping Use   Vaping Use: Never used  Substance and Sexual Activity   Alcohol use: Not Currently   Drug use: Never   Objective:  There were no vitals filed for this visit.  There is no height or weight on file to calculate BMI.  Physical Exam Vitals reviewed.  Constitutional:  General: She is not in acute distress. Appearance: Normal appearance.  HENT:  Head: Normocephalic and atraumatic.  Right Ear: External ear normal.  Left Ear: External ear normal.  Nose: Nose normal.  Mouth/Throat:  Mouth: Mucous membranes are moist.  Pharynx: Oropharynx is clear.  Eyes:  General: No scleral icterus. Extraocular Movements: Extraocular movements intact.  Conjunctiva/sclera: Conjunctivae normal.  Pupils: Pupils are equal, round, and reactive to light.  Cardiovascular:  Rate and Rhythm: Normal rate and regular rhythm.  Pulses: Normal pulses.  Heart sounds: Normal heart sounds.  Pulmonary:  Effort: Pulmonary effort is normal. No respiratory distress.  Breath sounds: Normal breath sounds.  Abdominal:  General: Bowel sounds are normal.  Palpations: Abdomen is soft.  Tenderness: There is no abdominal tenderness.  Musculoskeletal:  General: No swelling, tenderness or deformity. Normal range of motion.  Cervical back: Normal range of motion and neck supple.  Skin: General: Skin is warm and dry.  Coloration: Skin is not jaundiced.  Neurological:  General: No focal deficit present.  Mental Status: She is alert and oriented to person, place, and time.  Psychiatric:  Mood and Affect: Mood normal.  Behavior: Behavior normal.     Breast: There is a palpable bruise in the upper inner quadrant  of the right breast. Other than this there is no palpable mass in either breast. There is no palpable axillary, supraclavicular, or cervical lymphadenopathy.  Labs, Imaging and Diagnostic Testing:  Assessment and Plan:   Diagnoses and all orders for this visit:  Malignant neoplasm of upper-inner quadrant of right breast in female, estrogen receptor positive (CMS-HCC) - Ambulatory Referral to Oncology-Medical - Ambulatory Referral to Radiation Oncology - Ambulatory Referral to Physical Therapy - [facility] Case Posting Request; Future    The patient appears to have a 1.4 cm cancer in the upper inner quadrant of the right breast with clinically negative nodes. I have discussed with her in detail the different options for treatment and at this point she favors breast conservation which I feel is very reasonable. She is also a good candidate for sentinel node biopsy since she is in such good health. I have discussed with her in detail the risks and benefits of the operation as well as some of the technical aspects including the use of a radioactive seed for localization and she understands and wishes to proceed. She will meet with medical and radiation oncology to discuss adjuvant therapy. She will also meet with physical therapy for preoperative lymphedema testing

## 2021-10-23 ENCOUNTER — Encounter (HOSPITAL_BASED_OUTPATIENT_CLINIC_OR_DEPARTMENT_OTHER): Payer: Self-pay | Admitting: General Surgery

## 2021-10-23 LAB — SURGICAL PATHOLOGY

## 2021-10-23 NOTE — Addendum Note (Signed)
Addendum  created 10/23/21 1150 by Glory Buff, CRNA   Charge Capture section accepted

## 2021-10-24 ENCOUNTER — Encounter: Payer: Self-pay | Admitting: General Practice

## 2021-10-24 NOTE — Progress Notes (Signed)
[facility] Psychosocial Distress Screening Spiritual Care  Met with [place] in [facility] to introduce [facility] team/resources, reviewing distress screen per protocol.  The patient scored a 2 on the Psychosocial Distress Thermometer which indicates mild distress. Also assessed for distress and other psychosocial needs.   ONCBCN DISTRESS SCREENING 10/24/2021  Screening Type Initial Screening  Distress experienced in past week (1-10) 2  Emotional problem type Adjusting to illness;Boredom  Spiritual/Religous concerns type Facing my mortality;Loss of sense of purpose  Information Concerns Type Lack of info about treatment  Physical Problem type Pain;Constipation/diarrhea;Tingling hands/feet;Skin dry/itchy  Referral to support programs Yes   Ms Rouser was very welcoming of call, noting that overall she feels great after surgery and is currently in [place] visiting her brothers. She is using perspective, faith, and gratitude to cope. Provided empathic listening, emotional support, and reminder of [place] (Patient and [facility]) programming resources.  Follow up needed: Ms Fournier reports no other needs at this time and knows to reach out if needs arise or circumstances change.   [place], [place], [facility] Pager [phone] Voicemail [phone]

## 2021-10-25 ENCOUNTER — Encounter: Payer: Self-pay | Admitting: *Deleted

## 2021-10-25 DIAGNOSIS — Z853 Personal history of malignant neoplasm of breast: Secondary | ICD-10-CM | POA: Diagnosis not present

## 2021-10-25 DIAGNOSIS — Z1339 Encounter for screening examination for other mental health and behavioral disorders: Secondary | ICD-10-CM | POA: Diagnosis not present

## 2021-10-25 DIAGNOSIS — K219 Gastro-esophageal reflux disease without esophagitis: Secondary | ICD-10-CM | POA: Diagnosis not present

## 2021-10-25 DIAGNOSIS — R030 Elevated blood-pressure reading, without diagnosis of hypertension: Secondary | ICD-10-CM | POA: Diagnosis not present

## 2021-10-25 DIAGNOSIS — E559 Vitamin D deficiency, unspecified: Secondary | ICD-10-CM | POA: Diagnosis not present

## 2021-10-25 DIAGNOSIS — Z1331 Encounter for screening for depression: Secondary | ICD-10-CM | POA: Diagnosis not present

## 2021-10-25 DIAGNOSIS — R82998 Other abnormal findings in urine: Secondary | ICD-10-CM | POA: Diagnosis not present

## 2021-10-25 DIAGNOSIS — Z0000 Encounter for general adult medical examination without abnormal findings: Secondary | ICD-10-CM | POA: Diagnosis not present

## 2021-10-25 DIAGNOSIS — E785 Hyperlipidemia, unspecified: Secondary | ICD-10-CM | POA: Diagnosis not present

## 2021-10-26 DIAGNOSIS — Z1212 Encounter for screening for malignant neoplasm of rectum: Secondary | ICD-10-CM | POA: Diagnosis not present

## 2021-10-30 NOTE — Progress Notes (Signed)
Patient Care Team: Burnard Bunting, MD as PCP - General (Internal Medicine) Rolm Bookbinder, MD as Referring Physician (Dermatology) Mauro Kaufmann, RN as Oncology Nurse Navigator Rockwell Germany, RN as Oncology Nurse Navigator Jovita Kussmaul, MD as Consulting Physician (General Surgery) Deborah Lose, MD as Consulting Physician (Hematology and Oncology) Gery Pray, MD as Consulting Physician (Radiation Oncology)  DIAGNOSIS:    ICD-10-CM   1. Malignant neoplasm of upper-inner quadrant of right breast in female, estrogen receptor positive ([place])  C50.211    Z17.0       SUMMARY OF ONCOLOGIC HISTORY: Oncology History  Malignant neoplasm of upper-inner quadrant of right breast in female, estrogen receptor positive ([place])  09/14/2021 Initial Diagnosis   Screening mammogram detected right upper medial quadrant mass 1.4 cm spiculated mass by mammogram.  By ultrasound it measured 1 cm axilla negative, biopsy revealed grade 1-2 IDC ER 100%, PR 80%, HER2 negative, Ki-67 10%   09/20/2021 Cancer Staging   Staging form: Breast, AJCC 8th Edition - Clinical stage from 09/20/2021: Stage IA (cT1c, cN0, cM0, G2, ER+, PR+, HER2-) - Signed by Deborah Lose, MD on 09/20/2021 Stage prefix: Initial diagnosis Histologic grading system: 3 grade system     Genetic Testing   [facility] CancerNext-Expanded is Negative. Report date is 10/02/2021.  The CancerNext-Expanded gene panel offered by [facility] and includes sequencing, rearrangement, and RNA analysis for the following 77 genes: AIP, ALK, APC, ATM, AXIN2, BAP1, BARD1, BLM, BMPR1A, BRCA1, BRCA2, BRIP1, CDC73, CDH1, CDK4, CDKN1B, CDKN2A, CHEK2, CTNNA1, DICER1, FANCC, FH, FLCN, GALNT12, KIF1B, LZTR1, MAX, MEN1, MET, MLH1, MSH2, MSH3, MSH6, MUTYH, NBN, NF1, NF2, NTHL1, PALB2, PHOX2B, PMS2, POT1, PRKAR1A, PTCH1, PTEN, RAD51C, RAD51D, RB1, RECQL, RET, SDHA, SDHAF2, SDHB, SDHC, SDHD, SMAD4, SMARCA4, SMARCB1, SMARCE1, STK11, SUFU, TMEM127, TP53, TSC1, TSC2, VHL and  XRCC2 (sequencing and deletion/duplication); EGFR, EGLN1, HOXB13, KIT, MITF, PDGFRA, POLD1, and POLE (sequencing only); EPCAM and GREM1 (deletion/duplication only).    10/20/2021 Surgery   Right lumpectomy: 1.7 cm grade 3 IDC with high-grade DCIS, margins negative, 0/3 lymph nodes negative, ER 100%, PR 80%, HER2 negative (0), Ki-67 10%     CHIEF COMPLIANT: Follow-up of right breast cancer  INTERVAL HISTORY: Deborah Dougherty is a 76 y.o. with above-mentioned history of right breast cancer. Right lumpectomy on 10/20/2021 showed grade 3 invasive ductal carcinoma and high grade DCIS with focal necrosis and axillary lymph nodes negative for carcinoma. She presents to the clinic today for follow-up.  She is healing recovering very well from recent surgery.  There is some pain and discomfort in the axilla.  ALLERGIES:  is allergic to cefuroxime axetil.  MEDICATIONS:  Current Outpatient Medications  Medication Sig Dispense Refill   acetaminophen (TYLENOL) 325 MG tablet Take 2 tablets (650 mg total) by mouth every 6 (six) hours as needed for mild pain or headache.     calcium carbonate (OS-CAL) 1250 (500 Ca) MG chewable tablet Chew 1 tablet by mouth daily.     docusate sodium (COLACE) 100 MG capsule Take 1 capsule (100 mg total) by mouth 2 (two) times daily as needed for mild constipation. 10 capsule 0   oxyCODONE (ROXICODONE) 5 MG immediate release tablet Take 1 tablet (5 mg total) by mouth every 6 (six) hours as needed for severe pain. 10 tablet 0   PARoxetine (PAXIL) 20 MG tablet Take 20 mg by mouth daily.     No current facility-administered medications for this visit.    PHYSICAL EXAMINATION: ECOG PERFORMANCE STATUS: 1 - Symptomatic  but completely ambulatory  Vitals:   10/31/21 1420  BP: 124/85  Pulse: 83  Resp: 18  Temp: (!) 97.5 F (36.4 C)  SpO2: 100%   Filed Weights   10/31/21 1420  Weight: 155 lb 8 oz (70.5 kg)      LABORATORY DATA:  I have reviewed the data as listed CMP  Latest Ref Rng & Units 09/20/2021 01/02/2021 12/14/2020  Glucose 70 - 99 mg/dL 99 115(H) 102(H)  BUN 8 - 23 mg/dL 13 13 19   Creatinine 0.44 - 1.00 mg/dL 0.90 0.70 0.71  Sodium 135 - 145 mmol/L 140 146(H) 139  Potassium 3.5 - 5.1 mmol/L 3.9 3.9 4.2  Chloride 98 - 111 mmol/L 105 113(H) 106  CO2 22 - 32 mmol/L 28 24 25   Calcium 8.9 - 10.3 mg/dL 9.1 8.6(L) 8.5(L)  Total Protein 6.5 - 8.1 g/dL 6.6 6.6 -  Total Bilirubin 0.3 - 1.2 mg/dL 0.5 0.3 -  Alkaline Phos 38 - 126 U/L 67 94 -  AST 15 - 41 U/L 13(L) 23 -  ALT 0 - 44 U/L 12 37 -    Lab Results  Component Value Date   WBC 7.3 09/20/2021   HGB 12.6 09/20/2021   HCT 37.6 09/20/2021   MCV 95.2 09/20/2021   PLT 234 09/20/2021   NEUTROABS 4.0 09/20/2021    ASSESSMENT & PLAN:  Malignant neoplasm of upper-inner quadrant of right breast in female, estrogen receptor positive ([place]) 09/14/2021: Screening mammogram detected right upper medial quadrant mass 1.4 cm spiculated mass by mammogram.  By ultrasound it measured 1 cm axilla negative, biopsy revealed grade 1-2 IDC ER 100%, PR 80%, HER2 negative, Ki-67 10%  10/20/2021:Right lumpectomy: 1.7 cm grade 3 IDC with high-grade DCIS, margins negative, 0/3 lymph nodes negative, ER 100%, PR 80%, HER2 negative (0), Ki-67 10%  Pathology counseling: I discussed the final pathology report of the patient provided  a copy of this report. I discussed the margins as well as lymph node surgeries. We also discussed the final staging along with previously performed ER/PR and HER-2/neu testing.  Treatment plan: 1.  Plus or minus adjuvant radiation (patient has appointment to see radiation oncology) 2. adjuvant antiestrogen therapy with letrozole 2.5 mg daily x5 to 7 years  Letrozole counseling: We discussed the risks and benefits of anti-estrogen therapy with aromatase inhibitors. These include but not limited to insomnia, hot flashes, mood changes, vaginal dryness, bone density loss, and weight gain. We strongly  believe that the benefits far outweigh the risks. Patient understands these risks and consented to starting treatment. Planned treatment duration is 5-7 years.  Return to clinic in 4 months for survivorship care plan visit   No orders of the defined types were placed in this encounter.  The patient has a good understanding of the overall plan. she agrees with it. she will call with any problems that may develop before the next visit here.  Total time spent: 30 mins including face to face time and time spent for planning, charting and coordination of care  Rulon Eisenmenger, MD, MPH 10/31/2021  I, Thana Ates, am acting as scribe for Dr. Nicholas Dougherty.  I have reviewed the above documentation for accuracy and completeness, and I agree with the above.

## 2021-10-31 ENCOUNTER — Inpatient Hospital Stay: Payer: Medicare PPO | Attending: Hematology and Oncology | Admitting: Hematology and Oncology

## 2021-10-31 ENCOUNTER — Other Ambulatory Visit: Payer: Self-pay

## 2021-10-31 DIAGNOSIS — C50211 Malignant neoplasm of upper-inner quadrant of right female breast: Secondary | ICD-10-CM | POA: Diagnosis not present

## 2021-10-31 DIAGNOSIS — Z170 Estrogen receptor positive status [ER+]: Secondary | ICD-10-CM | POA: Diagnosis not present

## 2021-10-31 NOTE — Assessment & Plan Note (Signed)
09/14/2021: Screening mammogram detected right upper medial quadrant mass 1.4 cm spiculated mass by mammogram.  By ultrasound it measured 1 cm axilla negative, biopsy revealed grade 1-2 IDC ER 100%, PR 80%, HER2 negative, Ki-67 10%  10/20/2021:Right lumpectomy: 1.7 cm grade 3 IDC with high-grade DCIS, margins negative, 0/3 lymph nodes negative, ER 100%, PR 80%, HER2 negative (0), Ki-67 10%  Pathology counseling: I discussed the final pathology report of the patient provided  a copy of this report. I discussed the margins as well as lymph node surgeries. We also discussed the final staging along with previously performed ER/PR and HER-2/neu testing.  Treatment plan: 1.  Plus or minus adjuvant radiation 2. adjuvant antiestrogen therapy with letrozole 2.5 mg daily x5 to 7 years  Letrozole counseling: We discussed the risks and benefits of anti-estrogen therapy with aromatase inhibitors. These include but not limited to insomnia, hot flashes, mood changes, vaginal dryness, bone density loss, and weight gain. We strongly believe that the benefits far outweigh the risks. Patient understands these risks and consented to starting treatment. Planned treatment duration is 5-7 years.  Return to clinic in 3 months for survivorship care plan visit

## 2021-11-01 ENCOUNTER — Encounter: Payer: Self-pay | Admitting: *Deleted

## 2021-11-02 ENCOUNTER — Ambulatory Visit: Payer: Medicare PPO | Admitting: Hematology and Oncology

## 2021-11-08 NOTE — Progress Notes (Signed)
Location of Breast Cancer: Malignant neoplasm of upper-inner quadrant of right breast  ? ?Histology per Pathology Report:  ?A. LYMPH NODE, RIGHT AXILLARY, SENTINEL, EXCISION:  ?- Lymph node, negative for carcinoma (0/1)  ? ?B. LYMPH NODE, RIGHT AXILLARY, SENTINEL, EXCISION:  ?- Lymph node, negative for carcinoma (0/1)  ? ?C. LYMPH NODE, RIGHT AXILLARY, SENTINEL, EXCISION:  ?- Lymph node, negative for carcinoma (0/1)  ? ?D. BREAST, RIGHT, LUMPECTOMY:  ?- Invasive ductal carcinoma, 1.7 cm, grade 3  ?- Ductal carcinoma in situ, high-grade with focal necrosis  ?- Resection margins are negative for carcinoma - closest is the superior  ?margin at 0.4 cm  ?- Biopsy site changes  ?- See oncology table  ? ?Receptor Status:  ?Estrogen Receptor: 100%, positive, strong staining intensity  ?           Progesterone Receptor: 80%, positive, strong staining  ?intensity  ?           HER2: Negative (0)  ?           Ki-67: 10%  ? ?Did patient present with symptoms (if so, please note symptoms) or was this found on screening mammography?: had routine screening mammography on 08/10/21 showing an indeterminate irregular mass in the right breast  ? ?Past/Anticipated interventions by surgeon, if any:  ?Procedure(s): ?RIGHT BREAST LUMPECTOMY WITH RADIOACTIVE SEED LOCALIZATION AND DEEP RIGHT AXILLARY SENTINEL LYMPH NODE BIOPSY (Right) ?  ?Surgeon(s) and Role: ?   Jovita Kussmaul, MD - Primary ? ?Past/Anticipated interventions by medical oncology, if any: Dr Lindi Adie ?Treatment plan: ?1.  Plus or minus adjuvant radiation (patient has appointment to see radiation oncology) ?2. adjuvant antiestrogen therapy with letrozole 2.5 mg daily x5 to 7 years ? ?Lymphedema issues, if any:  no   ? ?Pain issues, if any:  left hip pain, 4th toe on right foot pain, right neck pain intermittent ? ?SAFETY ISSUES: ?Prior radiation? no ?Pacemaker/ICD? no ?Possible current pregnancy?no, hysterectomy ?Is the patient on methotrexate? no ? ?Current Complaints / other  details:  right breast tenderness ?   ?Vitals:  ? 11/13/21 1226  ?BP: 131/82  ?Pulse: 72  ?Resp: 18  ?Temp: (!) 96.8 ?F (36 ?C)  ?TempSrc: Temporal  ?SpO2: 99%  ?Weight: 156 lb 2 oz (70.8 kg)  ?Height: 5' 7"  (1.702 m)  ? ? ? ? ?

## 2021-11-11 NOTE — Progress Notes (Signed)
?Radiation Oncology         [phone] ?________________________________ ? ?Name: Deborah Dougherty MRN: [id number]  ?Date: 11/13/2021  DOB: 05-19-1946 ? ?Re-Evaluation Note ? ?CC: Burnard Bunting, MD  Nicholas Lose, MD ? ?  ICD-10-CM   ?1. Malignant neoplasm of upper-inner quadrant of right breast in female, estrogen receptor positive ([place])  C50.211   ? Z17.0   ?  ? ? ?Diagnosis: S/p right lumpectomy:  Stage IA (pT1c, pN0) Right Breast UIQ, Invasive ductal carcinoma with high-grade DCIS, ER+ / PR+ / Her2-, Grade 3 ? ?Narrative:  The patient returns today to discuss radiation treatment options. She was seen in the multidisciplinary breast clinic on 09/20/21.  ? ?Since consultation, she underwent genetic testing on 09/20/21. Results showed no clinically significant variants detected by BRCA plus or +RNAinsight testing. ? ?She opted to proceed with right breast lumpectomy and nodal biopsies on 10/20/21 under the care of Dr. Marlou Starks. Pathology from the procedure revealed: grade 3 invasive ductal carcinoma measuring 1.7 cm, and high-grade ductal carcinoma in-situ with focal necrosis. All margins negative for carcinoma. Nodal status of 3/3 right axillary sentinel lymph node excisions negative for carcinoma.  Prognostic indicators significant for: ER status 100% positive, PR status 80% positive, both with strong staining intensity; Her2 status negative; Proliferation marker Ki67 at 10%; Grade 3.  ? ?Since her procedure, the patient met with Dr. Lindi Adie on 10/31/21 to discuss further treatment options. During this visit, the patient endorsed some pain and discomfort in the right axilla. AI options were also discussed with the patient, and the patient consented to pursuing adjuvant antiestrogen therapy consisting of letrozole following RT. Planned duration of treatment is for 5-7 years.  ? ?On review of systems, the patient reports doing well since her surgery.  She did not need any narcotic pain medication.. She denies  significant swelling of the right breast or right arm and any other symptoms.  ? ? ?Allergies:  is allergic to cefdinir, cefuroxime axetil, and penicillin g. ? ?Meds: ?Current Outpatient Medications  ?Medication Sig Dispense Refill  ? acetaminophen (TYLENOL) 325 MG tablet Take 2 tablets (650 mg total) by mouth every 6 (six) hours as needed for mild pain or headache.    ? bisacodyl (DULCOLAX) 5 MG EC tablet Take 5 mg by mouth as needed for moderate constipation.    ? calcium carbonate (OS-CAL) 1250 (500 Ca) MG chewable tablet Chew 1 tablet by mouth daily.    ? ibuprofen (ADVIL) 200 MG tablet Take 200 mg by mouth every 6 (six) hours as needed.    ? naproxen sodium (ALEVE) 220 MG tablet Take 220 mg by mouth as needed.    ? PARoxetine (PAXIL) 20 MG tablet Take 20 mg by mouth daily.    ? docusate sodium (COLACE) 100 MG capsule Take 1 capsule (100 mg total) by mouth 2 (two) times daily as needed for mild constipation. (Patient not taking: Reported on 11/13/2021) 10 capsule 0  ? oxyCODONE (ROXICODONE) 5 MG immediate release tablet Take 1 tablet (5 mg total) by mouth every 6 (six) hours as needed for severe pain. (Patient not taking: Reported on 11/13/2021) 10 tablet 0  ? ?No current facility-administered medications for this encounter.  ? ? ?Physical Findings: ?The patient is in no acute distress. Patient is alert and oriented. ? height is 5' 7"  (1.702 m) and weight is 156 lb 2 oz (70.8 kg). Her temporal temperature is 96.8 ?F (36 ?C) (abnormal). Her blood pressure is 131/82 and her pulse  is 72. Her respiration is 18 and oxygen saturation is 99%.  No significant changes. Lungs are clear to auscultation bilaterally. Heart has regular rate and rhythm. No palpable cervical, supraclavicular, or axillary adenopathy. Abdomen soft, non-tender, normal bowel sounds. ? ?Right breast: Well-healed lumpectomy scar in the upper inner quadrant.  A separate scar is also noted in the axillary area.  No signs of drainage within the breast  nipple discharge or bleeding.  No signs of infection within the breast.  Some discoloration along the central breast from her sentinel node procedure. ? ?Lab Findings: ?Lab Results  ?Component Value Date  ? WBC 7.3 09/20/2021  ? HGB 12.6 09/20/2021  ? HCT 37.6 09/20/2021  ? MCV 95.2 09/20/2021  ? PLT 234 09/20/2021  ? ? ?Radiographic Findings: ?No results found. ? ?Impression: S/p right lumpectomy:  Stage IA (pT1c, pN0) Right Breast UIQ, Invasive ductal carcinoma with high-grade DCIS, ER+ / PR+ / Her2-, Grade 3 ? ?The patient would be a good candidate for adjuvant radiation therapy directed at the right breast.  Since the lymph nodes were negative she could proceed with hypofractionated accelerated radiation therapy over approximately 3-1/2 to 4 weeks.  We also discussed that she potentially could avoid radiation therapy as long as she does adjuvant hormonal therapy, however we do not have information concerning lymphovascular space invasion and so not sure if she would technically qualify for omission of radiation therapy is based on previous studies, since she did have a high-grade tumor. ? ?Patient does wish to be aggressive with her postoperative management.  Her performance status is excellent.  She wishes to proceed with adjuvant radiation therapy along with adjuvant hormonal therapy. ? ?Plan:  Patient is scheduled for CT simulation later today.  Treatments to begin next week.  Anticipate 3-1/2 to 4 weeks of radiation therapy. ? ?----------------------------------- ? ?Blair Promise, PhD, MD ? ?This document serves as a record of services personally performed by Gery Pray, MD. It was created on his behalf by Roney Mans, a trained medical scribe. The creation of this record is based on the scribe's personal observations and the provider's statements to them. This document has been checked and approved by the attending provider. ? ?

## 2021-11-13 ENCOUNTER — Other Ambulatory Visit: Payer: Self-pay

## 2021-11-13 ENCOUNTER — Encounter: Payer: Self-pay | Admitting: Radiation Oncology

## 2021-11-13 ENCOUNTER — Encounter: Payer: Self-pay | Admitting: Physical Therapy

## 2021-11-13 ENCOUNTER — Ambulatory Visit
Admission: RE | Admit: 2021-11-13 | Discharge: 2021-11-13 | Disposition: A | Discharge status: Home / Self Care | Payer: Medicare PPO | Source: Ambulatory Visit | Attending: Radiation Oncology | Admitting: Radiation Oncology

## 2021-11-13 ENCOUNTER — Ambulatory Visit: Payer: Medicare PPO | Attending: General Surgery | Admitting: Physical Therapy

## 2021-11-13 VITALS — BP 131/82 | HR 72 | Temp 96.8°F | Resp 18 | Ht 67.0 in | Wt 156.1 lb

## 2021-11-13 DIAGNOSIS — Z79899 Other long term (current) drug therapy: Secondary | ICD-10-CM | POA: Insufficient documentation

## 2021-11-13 DIAGNOSIS — C50211 Malignant neoplasm of upper-inner quadrant of right female breast: Secondary | ICD-10-CM

## 2021-11-13 DIAGNOSIS — Z170 Estrogen receptor positive status [ER+]: Secondary | ICD-10-CM | POA: Insufficient documentation

## 2021-11-13 DIAGNOSIS — Z510 Encounter for antineoplastic radiation therapy: Secondary | ICD-10-CM | POA: Diagnosis not present

## 2021-11-13 DIAGNOSIS — Z483 Aftercare following surgery for neoplasm: Secondary | ICD-10-CM | POA: Insufficient documentation

## 2021-11-13 DIAGNOSIS — R293 Abnormal posture: Secondary | ICD-10-CM | POA: Diagnosis present

## 2021-11-13 NOTE — Progress Notes (Signed)
See MD note for nursing evaluation. °

## 2021-11-13 NOTE — Patient Instructions (Signed)
[facility] [address], [address] ? [place] [address] ? [phone] ? ?After Breast Cancer Class ?It is recommended you attend the ABC class to be educated on lymphedema risk reduction. This class is free of charge and lasts for 1 hour. It is a 1-time class. You will need to download the Webex app either on your phone or computer. We will send you a link the night before or the morning of the class. You should be able to click on that link to join the class. This is not a confidential class. You don't have to turn your camera on, but other participants may be able to see your email address. You are scheduled for November 27, 2021 at 11:00 ? ?Scar massage ?You can begin gentle scar massage to you incision sites. Gently place one hand on the incision and move the skin (without sliding on the skin) in various directions. Do this for a few minutes and then you can gently massage either coconut oil or vitamin E cream into the scars. ? ?Compression garment ?You should continue wearing your compression bra until you feel like you no longer have swelling. ? ?Home exercise Program ?Continue doing the exercises you were given until you feel like you can do them without feeling any tightness at the end.  ? ?Walking Program ?Studies show that 30 minutes of walking per day (fast enough to elevate your heart rate) can significantly reduce the risk of a cancer recurrence. If you can't walk due to other medical reasons, we encourage you to find another activity you could do (like a stationary bike or water exercise). ? ?Posture ?After breast cancer surgery, people frequently sit with rounded shoulders posture because it puts their incisions on slack and feels better. If you sit like this and scar tissue forms in that position, you can become very tight and have pain sitting or standing with good posture. Try to be aware of your posture and sit and stand up tall to heal properly. ? ?Follow up PT: ?It is  recommended you return every 3 months for the first 3 years following surgery to be assessed on the SOZO machine for an L-Dex score. This helps prevent clinically significant lymphedema in 95% of patients. These follow up screens are 10 minute appointments that you are not billed for. You are scheduled for May 8th, 2023 at 1:00pm. ?

## 2021-11-13 NOTE — Therapy (Signed)
OUTPATIENT PHYSICAL THERAPY BREAST CANCER POST OP FOLLOW UP   Patient Name: Deborah Dougherty MRN: [id number] DOB:[birth year], 76 y.o., female Today's Date: 11/13/2021   PT End of Session - 11/13/21 1107     Visit Number 2    Number of Visits 2    PT Start Time 1104    PT Stop Time 1153    PT Time Calculation (min) 49 min    Activity Tolerance Patient tolerated treatment well    Behavior During Therapy WFL for tasks assessed/performed             Past Medical History:  Diagnosis Date   Anxiety    Arthritis    hands, wrists, hips and low back   Cancer ([place]) 09/2021   right breast IDC   Depression    Past Surgical History:  Procedure Laterality Date   ABDOMINAL HYSTERECTOMY  1998   BREAST LUMPECTOMY WITH RADIOACTIVE SEED AND SENTINEL LYMPH NODE BIOPSY Right 10/20/2021   Procedure: RIGHT BREAST LUMPECTOMY WITH RADIOACTIVE SEED AND SENTINEL LYMPH NODE BIOPSY;  Surgeon: Jovita Kussmaul, MD;  Location: [facility];  Service: General;  Laterality: Right;   [place] Left 12/09/2020   Procedure: TOTAL HIP ARTHROPLASTY ANTERIOR APPROACH;  Surgeon: Rod Can, MD;  Location: [facility];  Service: Orthopedics;  Laterality: Left;   WRIST SURGERY  2010   Patient Active Problem List   Diagnosis Date Noted   Genetic testing 09/29/2021   Family history of breast cancer 09/21/2021   Malignant neoplasm of upper-inner quadrant of right breast in female, estrogen receptor positive ([place]) 09/18/2021   Hip fracture ([place]) 12/08/2020    PCP: Burnard Bunting, MD  REFERRING PROVIDER: Jovita Kussmaul, MD  REFERRING DIAG: s/p right lumpectomy and sentinel node biopsy  THERAPY DIAG:  Malignant neoplasm of upper-inner quadrant of right breast in female, estrogen receptor positive ([place])  Abnormal posture  Aftercare following surgery for neoplasm  ONSET DATE: 10/20/2021  SUBJECTIVE:                                                                                                                                                                                            SUBJECTIVE STATEMENT: Patient underwent a right lumpectomy and sentinel node biopsy on 10/20/2021 with 3 negative lymph nodes removed. She will meet with radiation oncology today to determine if she needs radiation and will then have anti-estrogen therapy.  PERTINENT HISTORY:  Patient was diagnosed on 08/10/2021 with right grade II invasive ductal carcinoma breast cancer. It is ER/PR positive and HER2 negative with a Ki67 of 10%. Patient  underwent a right lumpectomy and sentinel node biopsy on 10/20/2021 with 3 negative lymph nodes removed. She has hardware in her left wrist from a surgery after a fall and had her left hip replaced on 12/09/2020.  PATIENT GOALS:  Reassess how my recovery is going related to arm function, pain, and swelling.  PAIN:  Are you having pain? Yes NPRS scale: 2/10 Pain location: Right axilla Pain orientation: Right  PAIN TYPE: aching Pain description: intermittent  Aggravating factors: Nothing Relieving factors: Nothing  PRECAUTIONS: Recent Surgery, right UE Lymphedema risk   OBJECTIVE:   PATIENT SURVEYS:  QUICK DASH:  Quick Dash - 11/13/21 0001     Open a tight or new jar Mild difficulty    Do heavy household chores (wash walls, wash floors) No difficulty    Carry a shopping bag or briefcase No difficulty    Wash your back No difficulty    Use a knife to cut food No difficulty    Recreational activities in which you take some force or impact through your arm, shoulder, or hand (golf, hammering, tennis) Mild difficulty    During the past week, to what extent has your arm, shoulder or hand problem interfered with your normal social activities with family, friends, neighbors, or groups? Not at all    During the past week, to what extent has your arm, shoulder or hand problem limited your work or other regular daily activities Not at all     Arm, shoulder, or hand pain. Mild    Tingling (pins and needles) in your arm, shoulder, or hand None    Difficulty Sleeping No difficulty    DASH Score 6.82 %              OBSERVATIONS:  Both right axillary and breast incisions are well healed. No redness or edema noted. Some scar tissue lumpy areas noted on right breast around incision.  POSTURE: Forward head and rounded shoulders  ACTIVITY: Patient is not currently exercising  LYMPHEDEMA ASSESSMENT:   UPPER EXTREMITY AROM/PROM:   A/PROM Right 09/20/2021 Left 09/20/2021 Right 11/13/2021  Shoulder extension 60 54 59  Shoulder flexion 149 134 143  Shoulder abduction 159 154 156  Shoulder internal rotation 81 69 80  Shoulder external rotation 74 83 82                          (Blank rows = not tested)  LYMPHEDEMA ASSESSMENTS:    LANDMARK RIGHT 09/20/2021 LEFT 09/20/2021 RIGHT 11/13/2021 LEFT 11/13/2021  10 cm proximal to olecranon process 26.7 26.7 26.8 27.4  Olecranon process 24 23.4 24 23.6  10 cm proximal to ulnar styloid process 22.2 21.2 21.8 20.9  Just proximal to ulnar styloid process 15.8 15.5 15.7 15.3  Across hand at thumb web space 19 18.5 19.6 18.5  At base of 2nd digit 6.3 5.9 6.1 5.9  (Blank rows = not tested)      Surgery type/Date: 10/20/2021 Number of lymph nodes removed: 3 Current/past treatment (chemo, radiation, hormone therapy): none Other symptoms:  Heaviness/tightness Yes Pain Yes Pitting edema No Infections No Decreased scar mobility Yes Stemmer sign No   PATIENT EDUCATION:  Education details: HEP and lymphedema education; scar massage and aftercare Person educated: Patient Education method: Explanation and Handouts Education comprehension: verbalized understanding   HOME EXERCISE PROGRAM:  Reviewed previously given post op HEP.   ASSESSMENT:  CLINICAL IMPRESSION: Patient underwent a right lumpectomy and sentinel node biopsy on 10/20/2021 with  3 negative lymph nodes removed. She  will meet with radiation oncology today to determine if she needs radiation and will then have anti-estrogen therapy. She is doing well s/p surgery and has regained full shoulder ROM, shows no signs of lymphedema, and her incisions have healed well. She has no need for PT at this time but plans to attend the After breast Cancer class for lymphedema education.  Pt will benefit from skilled therapeutic intervention to improve on the following deficits: Decreased knowledge of precautions, impaired UE functional use, pain, decreased ROM, postural dysfunction.   PT treatment/interventions: ADL/Self care home management, Therapeutic exercises, Therapeutic activity, Neuromuscular re-education, Balance training, Gait training, Patient/Family education, and Joint mobilization     GOALS: Goals reviewed with patient? Yes  LONG TERM GOALS:  (STG=LTG)  GOALS Name Target Date Goal status  1 Pt will demonstrate she has regained full shoulder ROM and function post operatively compared to baselines.  Baseline: 11/13/2021 MET  N/A   PLAN: PT FREQUENCY/DURATION: N/A  PLAN FOR NEXT SESSION: D/C - goals met  PHYSICAL THERAPY DISCHARGE SUMMARY  Visits from Start of Care: 2  Current functional level related to goals / functional outcomes: Goals met; see above for objective measurements.   Remaining deficits: None   Education / Equipment: HEP and lymphedema risk   Patient agrees to discharge. Patient goals were met. Patient is being discharged due to meeting the stated rehab goals.    [facility]  [address]  [place] [address]  [phone]  After Breast Cancer Class It is recommended you attend the ABC class to be educated on lymphedema risk reduction. This class is free of charge and lasts for 1 hour. It is a 1-time class. You will need to download the Webex app either on your phone or computer. We will send you a link the night before or the morning of  the class. You should be able to click on that link to join the class. This is not a confidential class. You don't have to turn your camera on, but other participants may be able to see your email address. You are scheduled for November 27, 2021 at 11:00  Scar massage You can begin gentle scar massage to you incision sites. Gently place one hand on the incision and move the skin (without sliding on the skin) in various directions. Do this for a few minutes and then you can gently massage either coconut oil or vitamin E cream into the scars.  Compression garment You should continue wearing your compression bra until you feel like you no longer have swelling.  Home exercise Program Continue doing the exercises you were given until you feel like you can do them without feeling any tightness at the end.   Walking Program Studies show that 30 minutes of walking per day (fast enough to elevate your heart rate) can significantly reduce the risk of a cancer recurrence. If you can't walk due to other medical reasons, we encourage you to find another activity you could do (like a stationary bike or water exercise).  Posture After breast cancer surgery, people frequently sit with rounded shoulders posture because it puts their incisions on slack and feels better. If you sit like this and scar tissue forms in that position, you can become very tight and have pain sitting or standing with good posture. Try to be aware of your posture and sit and stand up tall to heal properly.  Follow up PT:  It is recommended you return every 3 months for the first 3 years following surgery to be assessed on the SOZO machine for an L-Dex score. This helps prevent clinically significant lymphedema in 95% of patients. These follow up screens are 10 minute appointments that you are not billed for. You are scheduled for May 8th, 2023 at 1:00pm.   Annia Friendly, PT 11/13/21 11:54 AM

## 2021-11-16 DIAGNOSIS — Z510 Encounter for antineoplastic radiation therapy: Secondary | ICD-10-CM | POA: Diagnosis not present

## 2021-11-16 DIAGNOSIS — C50211 Malignant neoplasm of upper-inner quadrant of right female breast: Secondary | ICD-10-CM | POA: Diagnosis not present

## 2021-11-16 DIAGNOSIS — Z170 Estrogen receptor positive status [ER+]: Secondary | ICD-10-CM | POA: Diagnosis not present

## 2021-11-20 ENCOUNTER — Encounter: Payer: Self-pay | Admitting: *Deleted

## 2021-11-20 DIAGNOSIS — Z170 Estrogen receptor positive status [ER+]: Secondary | ICD-10-CM

## 2021-11-21 ENCOUNTER — Other Ambulatory Visit: Payer: Self-pay

## 2021-11-21 ENCOUNTER — Ambulatory Visit
Admission: RE | Admit: 2021-11-21 | Discharge: 2021-11-21 | Disposition: A | Discharge status: Home / Self Care | Payer: Medicare PPO | Source: Ambulatory Visit | Attending: Radiation Oncology | Admitting: Radiation Oncology

## 2021-11-21 DIAGNOSIS — Z170 Estrogen receptor positive status [ER+]: Secondary | ICD-10-CM

## 2021-11-21 DIAGNOSIS — Z510 Encounter for antineoplastic radiation therapy: Secondary | ICD-10-CM | POA: Diagnosis not present

## 2021-11-21 DIAGNOSIS — C50211 Malignant neoplasm of upper-inner quadrant of right female breast: Secondary | ICD-10-CM | POA: Diagnosis not present

## 2021-11-21 MED ORDER — RADIAPLEXRX EX GEL: Freq: Once | CUTANEOUS | Status: AC

## 2021-11-21 NOTE — Progress Notes (Signed)
Pt here for patient teaching.   ? ?Pt given Radiation and You booklet, skin care instructions, and Radiaplex gel.   ? ?Reviewed areas of pertinence such as fatigue, hair loss, mouth changes, skin changes, breast tenderness, and breast swelling .  ? ?Pt able to give teach back of to pat skin and use unscented/gentle soap,apply Radiaplex bid, avoid applying anything to skin within 4 hours of treatment, avoid wearing an under wire bra, and to use an electric razor if they must shave.  ? ?Pt verbalizes understanding of information given and will contact nursing with any questions or concerns.   ? ? ? ? ? ? ?  ?

## 2021-11-22 ENCOUNTER — Ambulatory Visit
Admission: RE | Admit: 2021-11-22 | Discharge: 2021-11-22 | Disposition: A | Discharge status: Home / Self Care | Payer: Medicare PPO | Source: Ambulatory Visit | Attending: Radiation Oncology | Admitting: Radiation Oncology

## 2021-11-22 DIAGNOSIS — Z510 Encounter for antineoplastic radiation therapy: Secondary | ICD-10-CM | POA: Diagnosis not present

## 2021-11-22 DIAGNOSIS — C50211 Malignant neoplasm of upper-inner quadrant of right female breast: Secondary | ICD-10-CM | POA: Diagnosis not present

## 2021-11-22 DIAGNOSIS — Z170 Estrogen receptor positive status [ER+]: Secondary | ICD-10-CM | POA: Diagnosis not present

## 2021-11-23 ENCOUNTER — Encounter (HOSPITAL_COMMUNITY): Payer: Self-pay

## 2021-11-23 ENCOUNTER — Ambulatory Visit
Admission: RE | Admit: 2021-11-23 | Discharge: 2021-11-23 | Disposition: A | Discharge status: Home / Self Care | Payer: Medicare PPO | Source: Ambulatory Visit | Attending: Radiation Oncology | Admitting: Radiation Oncology

## 2021-11-23 ENCOUNTER — Other Ambulatory Visit: Payer: Self-pay

## 2021-11-23 DIAGNOSIS — Z510 Encounter for antineoplastic radiation therapy: Secondary | ICD-10-CM | POA: Diagnosis not present

## 2021-11-23 DIAGNOSIS — C50211 Malignant neoplasm of upper-inner quadrant of right female breast: Secondary | ICD-10-CM | POA: Diagnosis not present

## 2021-11-23 DIAGNOSIS — Z170 Estrogen receptor positive status [ER+]: Secondary | ICD-10-CM | POA: Diagnosis not present

## 2021-11-24 ENCOUNTER — Ambulatory Visit
Admission: RE | Admit: 2021-11-24 | Discharge: 2021-11-24 | Disposition: A | Discharge status: Home / Self Care | Payer: Medicare PPO | Source: Ambulatory Visit | Attending: Radiation Oncology | Admitting: Radiation Oncology

## 2021-11-24 DIAGNOSIS — C50211 Malignant neoplasm of upper-inner quadrant of right female breast: Secondary | ICD-10-CM | POA: Diagnosis not present

## 2021-11-24 DIAGNOSIS — Z170 Estrogen receptor positive status [ER+]: Secondary | ICD-10-CM | POA: Diagnosis not present

## 2021-11-24 DIAGNOSIS — Z510 Encounter for antineoplastic radiation therapy: Secondary | ICD-10-CM | POA: Diagnosis not present

## 2021-11-27 ENCOUNTER — Ambulatory Visit
Admission: RE | Admit: 2021-11-27 | Discharge: 2021-11-27 | Disposition: A | Discharge status: Home / Self Care | Payer: Medicare PPO | Source: Ambulatory Visit | Attending: Radiation Oncology | Admitting: Radiation Oncology

## 2021-11-27 ENCOUNTER — Other Ambulatory Visit: Payer: Self-pay

## 2021-11-27 DIAGNOSIS — C50211 Malignant neoplasm of upper-inner quadrant of right female breast: Secondary | ICD-10-CM | POA: Diagnosis not present

## 2021-11-27 DIAGNOSIS — Z510 Encounter for antineoplastic radiation therapy: Secondary | ICD-10-CM | POA: Diagnosis not present

## 2021-11-27 DIAGNOSIS — Z170 Estrogen receptor positive status [ER+]: Secondary | ICD-10-CM | POA: Diagnosis not present

## 2021-11-28 ENCOUNTER — Ambulatory Visit
Admission: RE | Admit: 2021-11-28 | Discharge: 2021-11-28 | Disposition: A | Discharge status: Home / Self Care | Payer: Medicare PPO | Source: Ambulatory Visit | Attending: Radiation Oncology | Admitting: Radiation Oncology

## 2021-11-28 DIAGNOSIS — Z510 Encounter for antineoplastic radiation therapy: Secondary | ICD-10-CM | POA: Diagnosis not present

## 2021-11-28 DIAGNOSIS — C50211 Malignant neoplasm of upper-inner quadrant of right female breast: Secondary | ICD-10-CM | POA: Diagnosis not present

## 2021-11-28 DIAGNOSIS — Z170 Estrogen receptor positive status [ER+]: Secondary | ICD-10-CM | POA: Diagnosis not present

## 2021-11-29 ENCOUNTER — Ambulatory Visit
Admission: RE | Admit: 2021-11-29 | Discharge: 2021-11-29 | Disposition: A | Discharge status: Home / Self Care | Payer: Medicare PPO | Source: Ambulatory Visit | Attending: Radiation Oncology | Admitting: Radiation Oncology

## 2021-11-29 ENCOUNTER — Other Ambulatory Visit: Payer: Self-pay

## 2021-11-29 DIAGNOSIS — Z170 Estrogen receptor positive status [ER+]: Secondary | ICD-10-CM | POA: Diagnosis not present

## 2021-11-29 DIAGNOSIS — C50211 Malignant neoplasm of upper-inner quadrant of right female breast: Secondary | ICD-10-CM | POA: Diagnosis not present

## 2021-11-29 DIAGNOSIS — Z510 Encounter for antineoplastic radiation therapy: Secondary | ICD-10-CM | POA: Diagnosis not present

## 2021-11-30 ENCOUNTER — Ambulatory Visit
Admission: RE | Admit: 2021-11-30 | Discharge: 2021-11-30 | Disposition: A | Discharge status: Home / Self Care | Payer: Medicare PPO | Source: Ambulatory Visit | Attending: Radiation Oncology | Admitting: Radiation Oncology

## 2021-11-30 DIAGNOSIS — Z170 Estrogen receptor positive status [ER+]: Secondary | ICD-10-CM | POA: Diagnosis not present

## 2021-11-30 DIAGNOSIS — C50211 Malignant neoplasm of upper-inner quadrant of right female breast: Secondary | ICD-10-CM | POA: Diagnosis not present

## 2021-11-30 DIAGNOSIS — Z510 Encounter for antineoplastic radiation therapy: Secondary | ICD-10-CM | POA: Diagnosis not present

## 2021-12-01 ENCOUNTER — Other Ambulatory Visit: Payer: Self-pay

## 2021-12-01 ENCOUNTER — Ambulatory Visit
Admission: RE | Admit: 2021-12-01 | Discharge: 2021-12-01 | Disposition: A | Discharge status: Home / Self Care | Payer: Medicare PPO | Source: Ambulatory Visit | Attending: Radiation Oncology | Admitting: Radiation Oncology

## 2021-12-01 DIAGNOSIS — Z510 Encounter for antineoplastic radiation therapy: Secondary | ICD-10-CM | POA: Diagnosis not present

## 2021-12-01 DIAGNOSIS — C50211 Malignant neoplasm of upper-inner quadrant of right female breast: Secondary | ICD-10-CM | POA: Diagnosis not present

## 2021-12-01 DIAGNOSIS — Z170 Estrogen receptor positive status [ER+]: Secondary | ICD-10-CM | POA: Diagnosis not present

## 2021-12-04 ENCOUNTER — Ambulatory Visit: Payer: Medicare PPO

## 2021-12-05 ENCOUNTER — Other Ambulatory Visit: Payer: Self-pay

## 2021-12-05 ENCOUNTER — Ambulatory Visit
Admission: RE | Admit: 2021-12-05 | Discharge: 2021-12-05 | Disposition: A | Discharge status: Home / Self Care | Payer: Medicare PPO | Source: Ambulatory Visit | Attending: Radiation Oncology | Admitting: Radiation Oncology

## 2021-12-05 DIAGNOSIS — Z170 Estrogen receptor positive status [ER+]: Secondary | ICD-10-CM | POA: Diagnosis not present

## 2021-12-05 DIAGNOSIS — C50211 Malignant neoplasm of upper-inner quadrant of right female breast: Secondary | ICD-10-CM

## 2021-12-05 DIAGNOSIS — Z510 Encounter for antineoplastic radiation therapy: Secondary | ICD-10-CM | POA: Diagnosis not present

## 2021-12-05 MED ORDER — RADIAPLEXRX EX GEL: Freq: Once | CUTANEOUS | Status: AC

## 2021-12-06 ENCOUNTER — Other Ambulatory Visit: Payer: Self-pay

## 2021-12-06 ENCOUNTER — Ambulatory Visit
Admission: RE | Admit: 2021-12-06 | Discharge: 2021-12-06 | Disposition: A | Discharge status: Home / Self Care | Payer: Medicare PPO | Source: Ambulatory Visit | Attending: Radiation Oncology | Admitting: Radiation Oncology

## 2021-12-06 DIAGNOSIS — Z510 Encounter for antineoplastic radiation therapy: Secondary | ICD-10-CM | POA: Diagnosis not present

## 2021-12-06 DIAGNOSIS — Z170 Estrogen receptor positive status [ER+]: Secondary | ICD-10-CM | POA: Diagnosis not present

## 2021-12-06 DIAGNOSIS — C50211 Malignant neoplasm of upper-inner quadrant of right female breast: Secondary | ICD-10-CM | POA: Diagnosis not present

## 2021-12-07 ENCOUNTER — Ambulatory Visit
Admission: RE | Admit: 2021-12-07 | Discharge: 2021-12-07 | Disposition: A | Discharge status: Home / Self Care | Payer: Medicare PPO | Source: Ambulatory Visit | Attending: Radiation Oncology | Admitting: Radiation Oncology

## 2021-12-07 DIAGNOSIS — Z170 Estrogen receptor positive status [ER+]: Secondary | ICD-10-CM | POA: Diagnosis not present

## 2021-12-07 DIAGNOSIS — C50211 Malignant neoplasm of upper-inner quadrant of right female breast: Secondary | ICD-10-CM | POA: Diagnosis not present

## 2021-12-07 DIAGNOSIS — Z510 Encounter for antineoplastic radiation therapy: Secondary | ICD-10-CM | POA: Diagnosis not present

## 2021-12-08 ENCOUNTER — Other Ambulatory Visit: Payer: Self-pay

## 2021-12-08 ENCOUNTER — Ambulatory Visit
Admission: RE | Admit: 2021-12-08 | Discharge: 2021-12-08 | Disposition: A | Discharge status: Home / Self Care | Payer: Medicare PPO | Source: Ambulatory Visit | Attending: Radiation Oncology | Admitting: Radiation Oncology

## 2021-12-08 DIAGNOSIS — C50211 Malignant neoplasm of upper-inner quadrant of right female breast: Secondary | ICD-10-CM | POA: Diagnosis not present

## 2021-12-08 DIAGNOSIS — Z510 Encounter for antineoplastic radiation therapy: Secondary | ICD-10-CM | POA: Diagnosis not present

## 2021-12-08 DIAGNOSIS — Z170 Estrogen receptor positive status [ER+]: Secondary | ICD-10-CM | POA: Diagnosis not present

## 2021-12-11 ENCOUNTER — Other Ambulatory Visit: Payer: Self-pay

## 2021-12-11 ENCOUNTER — Ambulatory Visit
Admission: RE | Admit: 2021-12-11 | Discharge: 2021-12-11 | Disposition: A | Discharge status: Home / Self Care | Payer: Medicare PPO | Source: Ambulatory Visit | Attending: Radiation Oncology | Admitting: Radiation Oncology

## 2021-12-11 DIAGNOSIS — Z510 Encounter for antineoplastic radiation therapy: Secondary | ICD-10-CM | POA: Insufficient documentation

## 2021-12-11 DIAGNOSIS — Z170 Estrogen receptor positive status [ER+]: Secondary | ICD-10-CM | POA: Diagnosis not present

## 2021-12-11 DIAGNOSIS — C50211 Malignant neoplasm of upper-inner quadrant of right female breast: Secondary | ICD-10-CM | POA: Diagnosis not present

## 2021-12-12 ENCOUNTER — Ambulatory Visit: Payer: Medicare PPO

## 2021-12-12 ENCOUNTER — Ambulatory Visit
Admission: RE | Admit: 2021-12-12 | Discharge: 2021-12-12 | Disposition: A | Discharge status: Home / Self Care | Payer: Medicare PPO | Source: Ambulatory Visit | Attending: Radiation Oncology | Admitting: Radiation Oncology

## 2021-12-12 DIAGNOSIS — Z510 Encounter for antineoplastic radiation therapy: Secondary | ICD-10-CM | POA: Diagnosis not present

## 2021-12-12 DIAGNOSIS — C50211 Malignant neoplasm of upper-inner quadrant of right female breast: Secondary | ICD-10-CM | POA: Diagnosis not present

## 2021-12-12 DIAGNOSIS — Z170 Estrogen receptor positive status [ER+]: Secondary | ICD-10-CM | POA: Diagnosis not present

## 2021-12-13 ENCOUNTER — Other Ambulatory Visit: Payer: Self-pay

## 2021-12-13 ENCOUNTER — Ambulatory Visit: Payer: Medicare PPO

## 2021-12-13 ENCOUNTER — Ambulatory Visit
Admission: RE | Admit: 2021-12-13 | Discharge: 2021-12-13 | Disposition: A | Discharge status: Home / Self Care | Payer: Medicare PPO | Source: Ambulatory Visit | Attending: Radiation Oncology | Admitting: Radiation Oncology

## 2021-12-13 DIAGNOSIS — Z510 Encounter for antineoplastic radiation therapy: Secondary | ICD-10-CM | POA: Diagnosis not present

## 2021-12-13 DIAGNOSIS — C50211 Malignant neoplasm of upper-inner quadrant of right female breast: Secondary | ICD-10-CM | POA: Diagnosis not present

## 2021-12-13 DIAGNOSIS — Z170 Estrogen receptor positive status [ER+]: Secondary | ICD-10-CM | POA: Diagnosis not present

## 2021-12-14 ENCOUNTER — Ambulatory Visit
Admission: RE | Admit: 2021-12-14 | Discharge: 2021-12-14 | Disposition: A | Discharge status: Home / Self Care | Payer: Medicare PPO | Source: Ambulatory Visit | Attending: Radiation Oncology | Admitting: Radiation Oncology

## 2021-12-14 DIAGNOSIS — C50211 Malignant neoplasm of upper-inner quadrant of right female breast: Secondary | ICD-10-CM | POA: Diagnosis not present

## 2021-12-14 DIAGNOSIS — Z510 Encounter for antineoplastic radiation therapy: Secondary | ICD-10-CM | POA: Diagnosis not present

## 2021-12-15 ENCOUNTER — Other Ambulatory Visit: Payer: Self-pay

## 2021-12-15 ENCOUNTER — Ambulatory Visit
Admission: RE | Admit: 2021-12-15 | Discharge: 2021-12-15 | Disposition: A | Discharge status: Home / Self Care | Payer: Medicare PPO | Source: Ambulatory Visit | Attending: Radiation Oncology | Admitting: Radiation Oncology

## 2021-12-15 DIAGNOSIS — Z510 Encounter for antineoplastic radiation therapy: Secondary | ICD-10-CM | POA: Diagnosis not present

## 2021-12-15 DIAGNOSIS — C50211 Malignant neoplasm of upper-inner quadrant of right female breast: Secondary | ICD-10-CM | POA: Diagnosis not present

## 2021-12-18 ENCOUNTER — Other Ambulatory Visit: Payer: Self-pay

## 2021-12-18 ENCOUNTER — Ambulatory Visit: Payer: Medicare PPO

## 2021-12-18 ENCOUNTER — Encounter: Payer: Self-pay | Admitting: *Deleted

## 2021-12-18 ENCOUNTER — Ambulatory Visit
Admission: RE | Admit: 2021-12-18 | Discharge: 2021-12-18 | Disposition: A | Discharge status: Home / Self Care | Payer: Medicare PPO | Source: Ambulatory Visit | Attending: Radiation Oncology | Admitting: Radiation Oncology

## 2021-12-18 DIAGNOSIS — C50211 Malignant neoplasm of upper-inner quadrant of right female breast: Secondary | ICD-10-CM | POA: Diagnosis not present

## 2021-12-18 DIAGNOSIS — Z510 Encounter for antineoplastic radiation therapy: Secondary | ICD-10-CM | POA: Diagnosis not present

## 2021-12-18 DIAGNOSIS — Z170 Estrogen receptor positive status [ER+]: Secondary | ICD-10-CM

## 2021-12-19 ENCOUNTER — Encounter: Payer: Self-pay | Admitting: Radiation Oncology

## 2021-12-19 ENCOUNTER — Ambulatory Visit
Admission: RE | Admit: 2021-12-19 | Discharge: 2021-12-19 | Disposition: A | Discharge status: Home / Self Care | Payer: Medicare PPO | Source: Ambulatory Visit | Attending: Radiation Oncology | Admitting: Radiation Oncology

## 2021-12-19 DIAGNOSIS — Z510 Encounter for antineoplastic radiation therapy: Secondary | ICD-10-CM | POA: Diagnosis not present

## 2021-12-19 DIAGNOSIS — C50211 Malignant neoplasm of upper-inner quadrant of right female breast: Secondary | ICD-10-CM | POA: Diagnosis not present

## 2022-01-02 ENCOUNTER — Encounter: Payer: Self-pay | Admitting: Hematology and Oncology

## 2022-01-09 ENCOUNTER — Inpatient Hospital Stay: Payer: Medicare PPO | Attending: Hematology and Oncology | Admitting: Hematology and Oncology

## 2022-01-09 ENCOUNTER — Other Ambulatory Visit: Payer: Self-pay

## 2022-01-09 DIAGNOSIS — C50211 Malignant neoplasm of upper-inner quadrant of right female breast: Secondary | ICD-10-CM

## 2022-01-09 DIAGNOSIS — Z170 Estrogen receptor positive status [ER+]: Secondary | ICD-10-CM

## 2022-01-09 DIAGNOSIS — Z79811 Long term (current) use of aromatase inhibitors: Secondary | ICD-10-CM | POA: Insufficient documentation

## 2022-01-09 DIAGNOSIS — Z923 Personal history of irradiation: Secondary | ICD-10-CM | POA: Diagnosis not present

## 2022-01-09 DIAGNOSIS — Z79899 Other long term (current) drug therapy: Secondary | ICD-10-CM | POA: Insufficient documentation

## 2022-01-09 MED ORDER — VITAMIN B-12 1000 MCG PO TABS: 1000.0000 ug | ORAL_TABLET | Freq: Every day | ORAL | Status: AC

## 2022-01-09 MED ORDER — VITAMIN D 25 MCG (1000 UNIT) PO TABS: 1000.0000 [IU] | ORAL_TABLET | Freq: Every day | ORAL | Status: AC

## 2022-01-09 MED ORDER — GABAPENTIN 100 MG PO CAPS: 100.0000 mg | ORAL_CAPSULE | Freq: Two times a day (BID) | ORAL | Status: AC

## 2022-01-09 MED ORDER — LETROZOLE 2.5 MG PO TABS: 2.5000 mg | ORAL_TABLET | Freq: Every day | ORAL | 3 refills | Status: DC

## 2022-01-09 NOTE — Progress Notes (Signed)
? ?Patient Care Team: ?Burnard Bunting, MD as PCP - General (Internal Medicine) ?Rolm Bookbinder, MD as Referring Physician (Dermatology) ?Mauro Kaufmann, RN as Oncology Nurse Navigator ?Rockwell Germany, RN as Oncology Nurse Navigator ?Jovita Kussmaul, MD as Consulting Physician (General Surgery) ?Nicholas Lose, MD as Consulting Physician (Hematology and Oncology) ?Gery Pray, MD as Consulting Physician (Radiation Oncology) ? ?DIAGNOSIS:  ?Encounter Diagnosis  ?Name Primary?  ? Malignant neoplasm of upper-inner quadrant of right breast in female, estrogen receptor positive ([place])   ? ? ?SUMMARY OF ONCOLOGIC HISTORY: ?Oncology History  ?Malignant neoplasm of upper-inner quadrant of right breast in female, estrogen receptor positive ([place])  ?09/14/2021 Initial Diagnosis  ? Screening mammogram detected right upper medial quadrant mass 1.4 cm spiculated mass by mammogram.  By ultrasound it measured 1 cm axilla negative, biopsy revealed grade 1-2 IDC ER 100%, PR 80%, HER2 negative, Ki-67 10% ?  ?09/20/2021 Cancer Staging  ? Staging form: Breast, AJCC 8th Edition ?- Clinical stage from 09/20/2021: Stage IA (cT1c, cN0, cM0, G2, ER+, PR+, HER2-) - Signed by Nicholas Lose, MD on 09/20/2021 ?Stage prefix: Initial diagnosis ?Histologic grading system: 3 grade system ? ?  ? Genetic Testing  ? [facility] CancerNext-Expanded is Negative. Report date is 10/02/2021. ? ?The CancerNext-Expanded gene panel offered by [facility] and includes sequencing, rearrangement, and RNA analysis for the following 77 genes: AIP, ALK, APC, ATM, AXIN2, BAP1, BARD1, BLM, BMPR1A, BRCA1, BRCA2, BRIP1, CDC73, CDH1, CDK4, CDKN1B, CDKN2A, CHEK2, CTNNA1, DICER1, FANCC, FH, FLCN, GALNT12, KIF1B, LZTR1, MAX, MEN1, MET, MLH1, MSH2, MSH3, MSH6, MUTYH, NBN, NF1, NF2, NTHL1, PALB2, PHOX2B, PMS2, POT1, PRKAR1A, PTCH1, PTEN, RAD51C, RAD51D, RB1, RECQL, RET, SDHA, SDHAF2, SDHB, SDHC, SDHD, SMAD4, SMARCA4, SMARCB1, SMARCE1, STK11, SUFU, TMEM127, TP53, TSC1, TSC2, VHL and  XRCC2 (sequencing and deletion/duplication); EGFR, EGLN1, HOXB13, KIT, MITF, PDGFRA, POLD1, and POLE (sequencing only); EPCAM and GREM1 (deletion/duplication only).  ?  ?10/20/2021 Surgery  ? Right lumpectomy: 1.7 cm grade 3 IDC with high-grade DCIS, margins negative, 0/3 lymph nodes negative, ER 100%, PR 80%, HER2 negative (0), Ki-67 10% ?  ?11/22/2021 - 12/19/2021 Radiation Therapy  ? Adjuvant radiation ?  ?01/09/2022 -  Anti-estrogen oral therapy  ? With leftAdjuvant antiestrogen therapy result with letrozole ?  ? ? ?CHIEF COMPLIANT: Follow-up of right breast cancer ? ?INTERVAL HISTORY: Deborah Dougherty is a 76 y.o. with above-mentioned history of right breast cancer. She presents to the clinic today for a follow-up.  She has been doing quite well without any major problems or concerns.  She finished her radiation and is recovering from that. ? ? ?ALLERGIES:  is allergic to cefdinir. ? ?MEDICATIONS:  ?Current Outpatient Medications  ?Medication Sig Dispense Refill  ? cholecalciferol (VITAMIN D3) 25 MCG (1000 UNIT) tablet Take 1 tablet (1,000 Units total) by mouth daily.    ? gabapentin (NEURONTIN) 100 MG capsule Take 1 capsule (100 mg total) by mouth 2 (two) times daily.    ? letrozole (FEMARA) 2.5 MG tablet Take 1 tablet (2.5 mg total) by mouth daily. 90 tablet 3  ? vitamin B-12 (CYANOCOBALAMIN) 1000 MCG tablet Take 1 tablet (1,000 mcg total) by mouth daily.    ? acetaminophen (TYLENOL) 325 MG tablet Take 2 tablets (650 mg total) by mouth every 6 (six) hours as needed for mild pain or headache.    ? bisacodyl (DULCOLAX) 5 MG EC tablet Take 5 mg by mouth as needed for moderate constipation.    ? calcium carbonate (OS-CAL) 1250 (500 Ca) MG chewable  tablet Chew 1 tablet by mouth daily.    ? ibuprofen (ADVIL) 200 MG tablet Take 200 mg by mouth every 6 (six) hours as needed.    ? naproxen sodium (ALEVE) 220 MG tablet Take 220 mg by mouth as needed.    ? PARoxetine (PAXIL) 20 MG tablet Take 20 mg by mouth daily.    ? ?No  current facility-administered medications for this visit.  ? ? ?PHYSICAL EXAMINATION: ?ECOG PERFORMANCE STATUS: 1 - Symptomatic but completely ambulatory ? ?Vitals:  ? 01/09/22 1054  ?BP: (!) 141/88  ?Pulse: 88  ?Resp: 16  ?Temp: 97.7 ?F (36.5 ?C)  ?SpO2: 98%  ? ?Filed Weights  ? 01/09/22 1054  ?Weight: 156 lb 11.2 oz (71.1 kg)  ? ?  ? ?LABORATORY DATA:  ?I have reviewed the data as listed ? ?  Latest Ref Rng & Units 09/20/2021  ? 12:35 PM 01/02/2021  ?  4:00 PM 12/14/2020  ?  4:43 AM  ?CMP  ?Glucose 70 - 99 mg/dL 99   115   102    ?BUN 8 - 23 mg/dL 13   13   19     ?Creatinine 0.44 - 1.00 mg/dL 0.90   0.70   0.71    ?Sodium 135 - 145 mmol/L 140   146   139    ?Potassium 3.5 - 5.1 mmol/L 3.9   3.9   4.2    ?Chloride 98 - 111 mmol/L 105   113   106    ?CO2 22 - 32 mmol/L 28   24   25     ?Calcium 8.9 - 10.3 mg/dL 9.1   8.6   8.5    ?Total Protein 6.5 - 8.1 g/dL 6.6   6.6     ?Total Bilirubin 0.3 - 1.2 mg/dL 0.5   0.3     ?Alkaline Phos 38 - 126 U/L 67   94     ?AST 15 - 41 U/L 13   23     ?ALT 0 - 44 U/L 12   37     ? ? ?Lab Results  ?Component Value Date  ? WBC 7.3 09/20/2021  ? HGB 12.6 09/20/2021  ? HCT 37.6 09/20/2021  ? MCV 95.2 09/20/2021  ? PLT 234 09/20/2021  ? NEUTROABS 4.0 09/20/2021  ? ? ?ASSESSMENT & PLAN:  ?Malignant neoplasm of upper-inner quadrant of right breast in female, estrogen receptor positive ([place]) ?09/14/2021: Screening mammogram detected right upper medial quadrant mass 1.4 cm spiculated mass by mammogram.  By ultrasound it measured 1 cm axilla negative, biopsy revealed grade 1-2 IDC ER 100%, PR 80%, HER2 negative, Ki-67 10% ?  ?10/20/2021:Right lumpectomy: 1.7 cm grade 3 IDC with high-grade DCIS, margins negative, 0/3 lymph nodes negative, ER 100%, PR 80%, HER2 negative (0), Ki-67 10% ?Adjuvant radiation completed 12/19/2021 ?  ?Treatment plan: ?1.  Plus or minus adjuvant radiation (patient has appointment to see radiation oncology) ?2. adjuvant antiestrogen therapy with letrozole 2.5 mg daily x5 to 7  years  ?  ?Breast cancer surveillance: ?1.  Breast exam 01/09/2022: Benign ?2. mammogram to be done December 2023 ?  ?Return to clinic in 3 months for survivorship care plan was ? ?No orders of the defined types were placed in this encounter. ? ?The patient has a good understanding of the overall plan. she agrees with it. she will call with any problems that may develop before the next visit here. ?Total time spent: 30 mins including face to face time and  time spent for planning, charting and co-ordination of care ? ? Harriette Ohara, MD ?01/09/22 ? ? ? I Gardiner Coins am scribing for Dr. Lindi Adie ? ?I have reviewed the above documentation for accuracy and completeness, and I agree with the above. ?  ?

## 2022-01-09 NOTE — Assessment & Plan Note (Signed)
09/14/2021:?Screening mammogram detected right upper medial quadrant mass 1.4 cm spiculated mass by mammogram. ?By ultrasound it measured 1 cm axilla negative, biopsy revealed grade 1-2 IDC ER 100%, PR 80%, HER2 negative, Ki-67 10% ?? ?10/20/2021:Right lumpectomy: 1.7 cm grade 3 IDC with high-grade DCIS, margins negative, 0/3 lymph nodes negative, ER 100%, PR 80%, HER2 negative (0), Ki-67 10% ?? ?Treatment plan: ?1.  Plus or minus adjuvant radiation (patient has appointment to see radiation oncology) ?2. adjuvant antiestrogen therapy with letrozole 2.5 mg daily x5 to 7 years ?? ?Letrozole toxicities: ? ?Breast cancer surveillance: ?1.  Breast exam 01/09/2022: Benign ?2. mammogram to be done December 2023 ? ?

## 2022-01-10 ENCOUNTER — Encounter: Payer: Self-pay | Admitting: Radiation Oncology

## 2022-01-15 ENCOUNTER — Ambulatory Visit: Payer: Medicare PPO | Attending: General Surgery

## 2022-01-15 VITALS — Wt 157.2 lb

## 2022-01-15 DIAGNOSIS — Z483 Aftercare following surgery for neoplasm: Secondary | ICD-10-CM | POA: Insufficient documentation

## 2022-01-15 NOTE — Therapy (Signed)
?  OUTPATIENT PHYSICAL THERAPY SOZO SCREENING NOTE ? ? ?Patient Name: Deborah Dougherty ?MRN: [id number] ?DOB:1946-07-28, 76 y.o., female ?Today's Date: 01/15/2022 ? ?PCP: Burnard Bunting, MD ?REFERRING PROVIDER: Jovita Kussmaul, MD ? ? PT End of Session - 01/15/22 1313   ? ? Visit Number 2   # unchanged due to screen only  ? PT Start Time 1311   ? PT Stop Time 1315   ? PT Time Calculation (min) 4 min   ? Activity Tolerance Patient tolerated treatment well   ? Behavior During Therapy [facility] for tasks assessed/performed   ? ?  ?  ? ?  ? ? ?Past Medical History:  ?Diagnosis Date  ? Anxiety   ? Arthritis   ? hands, wrists, hips and low back  ? Cancer ([place]) 09/2021  ? right breast IDC  ? Depression   ? History of radiation therapy   ? Right breast 11/21/21-12/19/21- Dr. Gery Pray  ? ?Past Surgical History:  ?Procedure Laterality Date  ? ABDOMINAL HYSTERECTOMY  1998  ? BREAST LUMPECTOMY WITH RADIOACTIVE SEED AND SENTINEL LYMPH NODE BIOPSY Right 10/20/2021  ? Procedure: RIGHT BREAST LUMPECTOMY WITH RADIOACTIVE SEED AND SENTINEL LYMPH NODE BIOPSY;  Surgeon: Jovita Kussmaul, MD;  Location: [facility];  Service: General;  Laterality: Right;  ? TONSILLECTOMY  1955  ? TOTAL HIP ARTHROPLASTY Left 12/09/2020  ? Procedure: TOTAL HIP ARTHROPLASTY ANTERIOR APPROACH;  Surgeon: Rod Can, MD;  Location: [facility];  Service: Orthopedics;  Laterality: Left;  ? WRIST SURGERY  2010  ? ?Patient Active Problem List  ? Diagnosis Date Noted  ? Genetic testing 09/29/2021  ? Family history of breast cancer 09/21/2021  ? Malignant neoplasm of upper-inner quadrant of right breast in female, estrogen receptor positive ([place]) 09/18/2021  ? Hip fracture ([place]) 12/08/2020  ? ? ?REFERRING DIAG: right breast cancer at risk for lymphedema ? ?THERAPY DIAG:  ?Aftercare following surgery for neoplasm ? ?PERTINENT HISTORY: Patient was diagnosed on 08/10/2021 with right grade II invasive ductal carcinoma breast cancer. It is ER/PR positive and HER2  negative with a Ki67 of 10%. Patient underwent a right lumpectomy and sentinel node biopsy on 10/20/2021 with 3 negative lymph nodes removed. She has hardware in her left wrist from a surgery after a fall and had her left hip replaced on 12/09/2020. ? ?PRECAUTIONS: right UE Lymphedema risk, None ? ?SUBJECTIVE: Pt returns for her 3 month L-Dex screen.  ? ?PAIN:  ?Are you having pain? No ? ?SOZO SCREENING: ?Patient was assessed today using the SOZO machine to determine the lymphedema index score. This was compared to her baseline score. It was determined that she is within the recommended range when compared to her baseline and no further action is needed at this time. She will continue SOZO screenings. These are done every 3 months for 2 years post operatively followed by every 6 months for 2 years, and then annually. ? ? ? ?Otelia Limes, PTA ?01/15/2022, 1:16 PM ? ?  ? ?

## 2022-01-16 NOTE — Progress Notes (Incomplete)
?  Radiation Oncology         (336) [phone] ?________________________________ ? ?Patient Name: Deborah Dougherty ?MRN: [id number] ?DOB: October 28, 1945 ?Referring Physician: Burnard Bunting (Profile Not Attached) ?Date of Service: 12/19/2021 ?[facility], [place] ? ?                                                      End Of Treatment Note ? ?Diagnoses: C50.211-Malignant neoplasm of upper-inner quadrant of right female breast ?Z17.0-Estrogen receptor positive status [ER+] ? ?Cancer Staging: S/p right lumpectomy:  Stage IA (pT1c, pN0) Right Breast UIQ, Invasive ductal carcinoma with high-grade DCIS, ER+ / PR+ / Her2-, Grade 3 ? ?Intent: Curative ? ?Radiation Treatment Dates: 11/21/2021 through 12/19/2021 ?Site Technique Total Dose (Gy) Dose per Fx (Gy) Completed Fx Beam Energies  ?Breast, Right: Breast_R 3D 40.05/40.05 2.67 15/15 6X  ?Breast, Right: Breast_R_Bst specialPort 10/10 2 5/5 9E  ? ?Narrative: The patient tolerated radiation therapy relatively well. On the date of her final treatment, the patient reported mild tenderness to the incision site, and an ongoing  red itchy rash. Physical exam performed on the date of her final treatment revealed radiation dermatitis particularly in the upper inner aspect of the right breast and inframammary fold area. No skin breakdown was appreciated.  ? ?She does have a significant reaction at this point and I discussed use of antibiotic ointment if her skin breaks down. ? ?Plan: The patient will follow-up with radiation oncology in one month. ? ?________________________________________________ ?----------------------------------- ? ?Blair Promise, PhD, MD ? ?This document serves as a record of services personally performed by Gery Pray, MD. It was created on his behalf by Roney Mans, a trained medical scribe. The creation of this record is based on the scribe's personal observations and the provider's statements to them. This document has been checked and  approved by the attending provider. ? ?

## 2022-01-17 NOTE — Progress Notes (Signed)
?Radiation Oncology         (336) [phone] ?________________________________ ? ?Name: Deborah Dougherty MRN: [id number]  ?Date: 01/18/2022  DOB: Jan 22, 1946 ? ?Follow-Up Visit Note ? ?CC: Burnard Bunting, MD  Nicholas Lose, MD ? ?  ICD-10-CM   ?1. Malignant neoplasm of upper-inner quadrant of right breast in female, estrogen receptor positive ([place])  C50.211   ? Z17.0   ?  ? ? ?Diagnosis: S/p right lumpectomy:  Stage IA (pT1c, pN0) Right Breast UIQ, Invasive ductal carcinoma with high-grade DCIS, ER+ / PR+ / Her2-, Grade 3 ? ?Interval Since Last Radiation: 1 month  ? ?Intent: Curative ? ?Radiation Treatment Dates: 11/21/2021 through 12/19/2021 ?Site Technique Total Dose (Gy) Dose per Fx (Gy) Completed Fx Beam Energies  ?Breast, Right: Breast_R 3D 40.05/40.05 2.67 15/15 6X  ?Breast, Right: Breast_R_Bst specialPort 10/10 2 5/5 9E  ? ? ?Narrative:  The patient returns today for routine follow-up. The patient tolerated radiation therapy relatively well. On the date of her final treatment, the patient reported mild tenderness to the incision site, and an ongoing red itchy rash. Physical exam performed on the date of her final treatment revealed radiation dermatitis particularly in the upper inner aspect of the right breast and inframammary fold area. No skin breakdown was appreciated. Given her significant reaction at the time, I discussed the use of antibiotic ointment if her skin begins to show any breakdown.        ? ?Since completing XRT, the patient followed up with Dr. Lindi Adie on 01/09/22. During which time, the patient reported feeling quite well and denied any major problems or concerns. The patient began antiestrogen therapy consisting of letrozole during this visit, and will return to Dr. Lindi Adie in 3 months to review her survivorship care plan.    ? ?Otherwise, no significant interval history.  ? ?She denies any pain within the right breast area nipple discharge or bleeding.  She seems to be tolerating letrozole well  except for possibly some warm feelings not actually hot flashes.  She denies any itching within the breast area.                ? ?Allergies:  is allergic to cefdinir. ? ?Meds: ?Current Outpatient Medications  ?Medication Sig Dispense Refill  ? cholecalciferol (VITAMIN D3) 25 MCG (1000 UNIT) tablet Take 1 tablet (1,000 Units total) by mouth daily.    ? gabapentin (NEURONTIN) 100 MG capsule Take 1 capsule (100 mg total) by mouth 2 (two) times daily.    ? letrozole (FEMARA) 2.5 MG tablet Take 1 tablet (2.5 mg total) by mouth daily. 90 tablet 3  ? PARoxetine (PAXIL) 20 MG tablet Take 20 mg by mouth daily.    ? vitamin B-12 (CYANOCOBALAMIN) 1000 MCG tablet Take 1 tablet (1,000 mcg total) by mouth daily.    ? acetaminophen (TYLENOL) 325 MG tablet Take 2 tablets (650 mg total) by mouth every 6 (six) hours as needed for mild pain or headache. (Patient not taking: Reported on 01/18/2022)    ? bisacodyl (DULCOLAX) 5 MG EC tablet Take 5 mg by mouth as needed for moderate constipation. (Patient not taking: Reported on 01/18/2022)    ? calcium carbonate (OS-CAL) 1250 (500 Ca) MG chewable tablet Chew 1 tablet by mouth daily. (Patient not taking: Reported on 01/18/2022)    ? ibuprofen (ADVIL) 200 MG tablet Take 200 mg by mouth every 6 (six) hours as needed. (Patient not taking: Reported on 01/18/2022)    ? naproxen sodium (ALEVE) 220 MG  tablet Take 220 mg by mouth as needed. (Patient not taking: Reported on 01/18/2022)    ? ?No current facility-administered medications for this encounter.  ? ? ?Physical Findings: ?The patient is in no acute distress. Patient is alert and oriented. ? height is 5' 7"  (1.702 m) and weight is 155 lb 6.4 oz (70.5 kg). Her temperature is 97.5 ?F (36.4 ?C) (abnormal). Her blood pressure is 116/87 and her pulse is 94. Her respiration is 20 and oxygen saturation is 98%. .   Lungs are clear to auscultation bilaterally. Heart has regular rate and rhythm. No palpable cervical, supraclavicular, or axillary  adenopathy. Abdomen soft, non-tender, normal bowel sounds. ? ?Left Breast: no palpable mass, nipple discharge or bleeding. ?Right Breast: Skin is healed well.  Some mild erythema in the upper inner aspect of the breast and inframammary fold area no signs of infection.  No dominant mass appreciated in the breast nipple discharge or bleeding. ? ?Lab Findings: ?Lab Results  ?Component Value Date  ? WBC 7.3 09/20/2021  ? HGB 12.6 09/20/2021  ? HCT 37.6 09/20/2021  ? MCV 95.2 09/20/2021  ? PLT 234 09/20/2021  ? ? ?Radiographic Findings: ?No results found. ? ?Impression: S/p right lumpectomy:  Stage IA (pT1c, pN0) Right Breast UIQ, Invasive ductal carcinoma with high-grade DCIS, ER+ / PR+ / Her2-, Grade 3   ? ?She has recovered well from her radiation therapy.  No evidence of recurrence on clinical exam today. ? ?Plan: As needed follow-up in radiation oncology.  She will continue close follow-up in medical oncology and continue on letrozole. ? ? ? ?____________________________________ ? ?Blair Promise, PhD, MD ? ?This document serves as a record of services personally performed by Gery Pray, MD. It was created on his behalf by Roney Mans, a trained medical scribe. The creation of this record is based on the scribe's personal observations and the provider's statements to them. This document has been checked and approved by the attending provider. ? ?

## 2022-01-18 ENCOUNTER — Other Ambulatory Visit: Payer: Self-pay

## 2022-01-18 ENCOUNTER — Ambulatory Visit
Admission: RE | Admit: 2022-01-18 | Discharge: 2022-01-18 | Disposition: A | Discharge status: Home / Self Care | Payer: Medicare PPO | Source: Ambulatory Visit | Attending: Radiation Oncology | Admitting: Radiation Oncology

## 2022-01-18 ENCOUNTER — Encounter: Payer: Self-pay | Admitting: Radiation Oncology

## 2022-01-18 DIAGNOSIS — Z170 Estrogen receptor positive status [ER+]: Secondary | ICD-10-CM | POA: Insufficient documentation

## 2022-01-18 DIAGNOSIS — Z79899 Other long term (current) drug therapy: Secondary | ICD-10-CM | POA: Insufficient documentation

## 2022-01-18 DIAGNOSIS — Z79811 Long term (current) use of aromatase inhibitors: Secondary | ICD-10-CM | POA: Insufficient documentation

## 2022-01-18 DIAGNOSIS — Z923 Personal history of irradiation: Secondary | ICD-10-CM | POA: Insufficient documentation

## 2022-01-18 DIAGNOSIS — C50211 Malignant neoplasm of upper-inner quadrant of right female breast: Secondary | ICD-10-CM | POA: Diagnosis not present

## 2022-01-18 HISTORY — DX: Personal history of irradiation: Z92.3

## 2022-01-18 NOTE — Progress Notes (Signed)
Deborah Dougherty is here today for follow up post radiation to the breast. ? ? Breast Side:Right ? ? ?They completed their radiation on: 12/19/21 ? ?Does the patient complain of any of the following: ?Post radiation skin issues: Patient states the skin still itches at times. Noted redness to right breast.  ?Breast Tenderness: Mild tenderness to surgical incision.  ?Breast Swelling: No ?Lymphadema: No ?Range of Motion limitations: No ?Fatigue post radiation: Yes, patient reports improvement in energy level.  ?Appetite good/fair/poor: Good ? ?Additional comments if applicable:  ? ?Vitals:  ? 01/18/22 1539  ?BP: 116/87  ?Pulse: 94  ?Resp: 20  ?Temp: (!) 97.5 ?F (36.4 ?C)  ?SpO2: 98%  ?Weight: 155 lb 6.4 oz (70.5 kg)  ?Height: '5\' 7"'$  (1.702 m)  ?  ?

## 2022-02-27 ENCOUNTER — Encounter: Payer: Medicare PPO | Admitting: Adult Health

## 2022-04-09 ENCOUNTER — Telehealth: Payer: Self-pay | Admitting: Adult Health

## 2022-04-09 NOTE — Telephone Encounter (Signed)
Scheduled per 7/31 in basket, pt has confirmed new appt

## 2022-04-12 ENCOUNTER — Inpatient Hospital Stay: Payer: Medicare PPO | Admitting: Adult Health

## 2022-04-18 DIAGNOSIS — L218 Other seborrheic dermatitis: Secondary | ICD-10-CM | POA: Diagnosis not present

## 2022-04-30 DIAGNOSIS — F329 Major depressive disorder, single episode, unspecified: Secondary | ICD-10-CM | POA: Diagnosis not present

## 2022-04-30 DIAGNOSIS — K219 Gastro-esophageal reflux disease without esophagitis: Secondary | ICD-10-CM | POA: Diagnosis not present

## 2022-05-08 DIAGNOSIS — Z1152 Encounter for screening for COVID-19: Secondary | ICD-10-CM | POA: Diagnosis not present

## 2022-05-08 DIAGNOSIS — R509 Fever, unspecified: Secondary | ICD-10-CM | POA: Diagnosis not present

## 2022-05-08 DIAGNOSIS — R051 Acute cough: Secondary | ICD-10-CM | POA: Diagnosis not present

## 2022-05-08 DIAGNOSIS — K219 Gastro-esophageal reflux disease without esophagitis: Secondary | ICD-10-CM | POA: Diagnosis not present

## 2022-05-08 DIAGNOSIS — F329 Major depressive disorder, single episode, unspecified: Secondary | ICD-10-CM | POA: Diagnosis not present

## 2022-05-08 DIAGNOSIS — R5383 Other fatigue: Secondary | ICD-10-CM | POA: Diagnosis not present

## 2022-05-08 DIAGNOSIS — J069 Acute upper respiratory infection, unspecified: Secondary | ICD-10-CM | POA: Diagnosis not present

## 2022-05-15 ENCOUNTER — Inpatient Hospital Stay: Payer: Medicare PPO | Attending: Adult Health | Admitting: Adult Health

## 2022-05-15 ENCOUNTER — Other Ambulatory Visit: Payer: Self-pay

## 2022-05-15 ENCOUNTER — Encounter: Payer: Self-pay | Admitting: Adult Health

## 2022-05-15 VITALS — BP 128/75 | HR 86 | Temp 97.5°F | Resp 15 | Wt 150.1 lb

## 2022-05-15 DIAGNOSIS — Z923 Personal history of irradiation: Secondary | ICD-10-CM | POA: Insufficient documentation

## 2022-05-15 DIAGNOSIS — Z79899 Other long term (current) drug therapy: Secondary | ICD-10-CM | POA: Diagnosis not present

## 2022-05-15 DIAGNOSIS — Z803 Family history of malignant neoplasm of breast: Secondary | ICD-10-CM | POA: Insufficient documentation

## 2022-05-15 DIAGNOSIS — Z170 Estrogen receptor positive status [ER+]: Secondary | ICD-10-CM

## 2022-05-15 DIAGNOSIS — Z79811 Long term (current) use of aromatase inhibitors: Secondary | ICD-10-CM | POA: Insufficient documentation

## 2022-05-15 DIAGNOSIS — Z801 Family history of malignant neoplasm of trachea, bronchus and lung: Secondary | ICD-10-CM | POA: Diagnosis not present

## 2022-05-15 DIAGNOSIS — Z87891 Personal history of nicotine dependence: Secondary | ICD-10-CM | POA: Diagnosis not present

## 2022-05-15 DIAGNOSIS — C50211 Malignant neoplasm of upper-inner quadrant of right female breast: Secondary | ICD-10-CM | POA: Diagnosis not present

## 2022-05-15 NOTE — Progress Notes (Signed)
Orders entered per NP. Faxed to [facility]; confirmation received.

## 2022-05-15 NOTE — Progress Notes (Signed)
SURVIVORSHIP VISIT:   BRIEF ONCOLOGIC HISTORY:  Oncology History  Malignant neoplasm of upper-inner quadrant of right breast in female, estrogen receptor positive ([place])  09/14/2021 Initial Diagnosis   Screening mammogram detected right upper medial quadrant mass 1.4 cm spiculated mass by mammogram.  By ultrasound it measured 1 cm axilla negative, biopsy revealed grade 1-2 IDC ER 100%, PR 80%, HER2 negative, Ki-67 10%   09/20/2021 Cancer Staging   Staging form: Breast, AJCC 8th Edition - Clinical stage from 09/20/2021: Stage IA (cT1c, cN0, cM0, G2, ER+, PR+, HER2-) - Signed by Nicholas Lose, MD on 09/20/2021 Stage prefix: Initial diagnosis Histologic grading system: 3 grade system    Genetic Testing   [facility] CancerNext-Expanded is Negative. Report date is 10/02/2021.  The CancerNext-Expanded gene panel offered by [facility] and includes sequencing, rearrangement, and RNA analysis for the following 77 genes: AIP, ALK, APC, ATM, AXIN2, BAP1, BARD1, BLM, BMPR1A, BRCA1, BRCA2, BRIP1, CDC73, CDH1, CDK4, CDKN1B, CDKN2A, CHEK2, CTNNA1, DICER1, FANCC, FH, FLCN, GALNT12, KIF1B, LZTR1, MAX, MEN1, MET, MLH1, MSH2, MSH3, MSH6, MUTYH, NBN, NF1, NF2, NTHL1, PALB2, PHOX2B, PMS2, POT1, PRKAR1A, PTCH1, PTEN, RAD51C, RAD51D, RB1, RECQL, RET, SDHA, SDHAF2, SDHB, SDHC, SDHD, SMAD4, SMARCA4, SMARCB1, SMARCE1, STK11, SUFU, TMEM127, TP53, TSC1, TSC2, VHL and XRCC2 (sequencing and deletion/duplication); EGFR, EGLN1, HOXB13, KIT, MITF, PDGFRA, POLD1, and POLE (sequencing only); EPCAM and GREM1 (deletion/duplication only).    10/20/2021 Surgery   Right lumpectomy: 1.7 cm grade 3 IDC with high-grade DCIS, margins negative, 0/3 lymph nodes negative, ER 100%, PR 80%, HER2 negative (0), Ki-67 10%   11/22/2021 - 12/19/2021 Radiation Therapy   Site Technique Total Dose (Gy) Dose per Fx (Gy) Completed Fx Beam Energies  Breast, Right: Breast_R 3D 40.05/40.05 2.67 15/15 6X  Breast, Right: Breast_R_Bst specialPort 10/10 2 5/5 9E      01/09/2022 -  Anti-estrogen oral therapy   With leftAdjuvant antiestrogen therapy result with letrozole     INTERVAL HISTORY:  Ms. Dang to review her survivorship care plan detailing her treatment course for breast cancer, as well as monitoring long-term side effects of that treatment, education regarding health maintenance, screening, and overall wellness and health promotion.     Overall, Ms. Edelen reports feeling quite well. She is taking Letrozole daily with good tolerance.  She has some right breast soreness that is tolerable.  She has lost some weight since stopping Paxil and alcohol.  She stopped taking Paxil July 19 and has since been very tearful.  She is hesitant to restart the Paxil.    REVIEW OF SYSTEMS:  Review of Systems  Constitutional:  Negative for appetite change, chills, fatigue, fever and unexpected weight change.  HENT:   Negative for hearing loss, lump/mass and trouble swallowing.   Eyes:  Negative for eye problems and icterus.  Respiratory:  Negative for chest tightness, cough and shortness of breath.   Cardiovascular:  Negative for chest pain, leg swelling and palpitations.  Gastrointestinal:  Negative for abdominal distention, abdominal pain, constipation, diarrhea, nausea and vomiting.  Endocrine: Negative for hot flashes.  Genitourinary:  Negative for difficulty urinating.   Musculoskeletal:  Negative for arthralgias.  Skin:  Negative for itching and rash.  Neurological:  Negative for dizziness, extremity weakness, headaches and numbness.  Hematological:  Negative for adenopathy. Does not bruise/bleed easily.  Psychiatric/Behavioral:  Positive for depression. Negative for suicidal ideas. The patient is nervous/anxious.   Breast: Denies any new nodularity, masses, tenderness, nipple changes, or nipple discharge.      ONCOLOGY TREATMENT TEAM:  1. Surgeon:  Dr. Marlou Starks at [facility] 2. Medical Oncologist: Dr. Lindi Adie  3. Radiation  Oncologist: Dr. Sondra Come    PAST MEDICAL/SURGICAL HISTORY:  Past Medical History:  Diagnosis Date   Anxiety    Arthritis    hands, wrists, hips and low back   Cancer ([place]) 09/2021   right breast IDC   Depression    History of radiation therapy    Right breast 11/21/21-12/19/21- Dr. Gery Pray   Past Surgical History:  Procedure Laterality Date   ABDOMINAL HYSTERECTOMY  1998   BREAST LUMPECTOMY WITH RADIOACTIVE SEED AND SENTINEL LYMPH NODE BIOPSY Right 10/20/2021   Procedure: RIGHT BREAST LUMPECTOMY WITH RADIOACTIVE SEED AND SENTINEL LYMPH NODE BIOPSY;  Surgeon: Jovita Kussmaul, MD;  Location: [place];  Service: General;  Laterality: Right;   [place] Left 12/09/2020   Procedure: TOTAL HIP ARTHROPLASTY ANTERIOR APPROACH;  Surgeon: Rod Can, MD;  Location: [facility];  Service: Orthopedics;  Laterality: Left;   WRIST SURGERY  2010     ALLERGIES:  Allergies  Allergen Reactions   Cefdinir Other (See Comments)    causes  diarrhea  sometimes severe     CURRENT MEDICATIONS:  Outpatient Encounter Medications as of 05/15/2022  Medication Sig   acetaminophen (TYLENOL) 325 MG tablet Take 2 tablets (650 mg total) by mouth every 6 (six) hours as needed for mild pain or headache. (Patient taking differently: Take 650 mg by mouth as needed for mild pain or headache.)   cholecalciferol (VITAMIN D3) 25 MCG (1000 UNIT) tablet Take 1 tablet (1,000 Units total) by mouth daily.   gabapentin (NEURONTIN) 100 MG capsule Take 1 capsule (100 mg total) by mouth 2 (two) times daily.   ibuprofen (ADVIL) 200 MG tablet Take 200 mg by mouth every 6 (six) hours as needed.   letrozole (FEMARA) 2.5 MG tablet Take 1 tablet (2.5 mg total) by mouth daily.   vitamin B-12 (CYANOCOBALAMIN) 1000 MCG tablet Take 1 tablet (1,000 mcg total) by mouth daily. (Patient taking differently: Take 1,000 mcg by mouth once a week.)   bisacodyl (DULCOLAX) 5 MG EC tablet Take 5  mg by mouth as needed for moderate constipation. (Patient not taking: Reported on 01/18/2022)   naproxen sodium (ALEVE) 220 MG tablet Take 220 mg by mouth as needed. (Patient not taking: Reported on 01/18/2022)   PARoxetine (PAXIL) 20 MG tablet Take 20 mg by mouth daily. (Patient not taking: Reported on 05/15/2022)   [DISCONTINUED] calcium carbonate (OS-CAL) 1250 (500 Ca) MG chewable tablet Chew 1 tablet by mouth daily. (Patient not taking: Reported on 01/18/2022)   No facility-administered encounter medications on file as of 05/15/2022.     ONCOLOGIC FAMILY HISTORY:  Family History  Problem Relation Age of Onset   Breast cancer Mother 59   Lung cancer Mother        breast cancer metastasized to lung   Stomach cancer Father 15   Mesothelioma Brother     SOCIAL HISTORY:  Social History   Socioeconomic History   Marital status: Widowed    Spouse name: Not on file   Number of children: 1   Years of education: Not on file   Highest education level: Bachelor's degree (e.g., BA, AB, BS)  Occupational History   Not on file  Tobacco Use   Smoking status: Former    Types: Cigarettes    Quit date: 1969    Years since quitting: 54.7   Smokeless  tobacco: Never   Tobacco comments:    quit 1969  Substance and Sexual Activity   Alcohol use: Yes    Comment: occasionally, 6 oz daily   Drug use: Never   Sexual activity: Not Currently    Birth control/protection: Surgical  Other Topics Concern   Not on file  Social History Narrative   Lives alone   Caffeine 10 oz daily   Social Determinants of Health   Financial Resource Strain: Not on file  Food Insecurity: Not on file  Transportation Needs: Not on file  Physical Activity: Not on file  Stress: Not on file  Social Connections: Not on file  Intimate Partner Violence: Not on file     OBSERVATIONS/OBJECTIVE:  BP 128/75 (BP Location: Left Arm, Patient Position: Sitting)   Pulse 86   Temp (!) 97.5 F (36.4 C) (Temporal)   Resp 15    Wt 150 lb 1.6 oz (68.1 kg)   SpO2 98%   BMI 23.51 kg/m  GENERAL: Patient is a well appearing female in no acute distress HEENT:  Sclerae anicteric.  Oropharynx clear and moist. No ulcerations or evidence of oropharyngeal candidiasis. Neck is supple.  NODES:  No cervical, supraclavicular, or axillary lymphadenopathy palpated.  BREAST EXAM:  right breast s/p lumpectomy and radiation, no sign of local recurrence, left breast benign LUNGS:  Clear to auscultation bilaterally.  No wheezes or rhonchi. HEART:  Regular rate and rhythm. No murmur appreciated. ABDOMEN:  Soft, nontender.  Positive, normoactive bowel sounds. No organomegaly palpated. MSK:  No focal spinal tenderness to palpation. Full range of motion bilaterally in the upper extremities. EXTREMITIES:  No peripheral edema.   SKIN:  Clear with no obvious rashes or skin changes. No nail dyscrasia. NEURO:  Nonfocal. Well oriented.  Appropriate affect.   LABORATORY DATA:  None for this visit.  DIAGNOSTIC IMAGING:  None for this visit.      ASSESSMENT AND PLAN:  Ms.. Lisbon is a pleasant 76 y.o. female with Stage IA right breast invasive ductal carcinoma, ER+/PR+/HER2-, diagnosed in 09/2021, treated with lumpectomy, adjuvant radiation therapy, and anti-estrogen therapy with Letrozole beginning in 01/2022.  She presents to the Survivorship Clinic for our initial meeting and routine follow-up post-completion of treatment for breast cancer.    1. Stage IA right breast cancer:  Ms. Goodreau is continuing to recover from definitive treatment for breast cancer. She will follow-up with her medical oncologist, Dr. Lindi Adie in 6 months with history and physical exam per surveillance protocol.  She will continue her anti-estrogen therapy with Letrozole. Thus far, she is tolerating the Letrozole well, with minimal side effects. She was instructed to make Dr. Lindi Adie or myself aware if she begins to experience any worsening side effects of the medication  and I could see her back in clinic to help manage those side effects, as needed. Her mammogram is due 08/2022; orders placed today. Today, a comprehensive survivorship care plan and treatment summary was reviewed with the patient today detailing her breast cancer diagnosis, treatment course, potential late/long-term effects of treatment, appropriate follow-up care with recommendations for the future, and patient education resources.  A copy of this summary, along with a letter will be sent to the patient's primary care provider via mail/fax/In Basket message after today's visit.    2. Increased tearfulness/mood changes: She has recently stopped paxil and is struggling with being increasingly tearful and sad for no reason.  This happened several weeks after she started the Letrozole.  Letrozole also can cause  mood changes.  I reviewed this with her and recommended that she consider restarting paxil at 21m daily.  She is going to think about it.   3. Bone health:  Given Ms. Naim's age/history of breast cancer and her current treatment regimen including anti-estrogen therapy with Letrozole, she is at risk for bone demineralization. Her PCP follows her bone density testing which she believes was most recently completed about 2 years ago.   I could not find records of this so I asked my nurse to obtain records from her PCP. She was given education on specific activities to promote bone health.  4. Cancer screening:  Due to Ms. Eckles's history and her age, she should receive screening for skin cancers, colon cancer.  The information and recommendations are listed on the patient's comprehensive care plan/treatment summary and were reviewed in detail with the patient.    5. Health maintenance and wellness promotion: Ms. RZamarronwas encouraged to consume 5-7 servings of fruits and vegetables per day. We reviewed the "Nutrition Rainbow" handout.  She was also encouraged to engage in moderate to vigorous exercise  for 30 minutes per day most days of the week. We discussed the [employer] fitness program, which is designed for cancer survivors to help them become more physically fit after cancer treatments.  She was instructed to limit her alcohol consumption and continue to abstain from tobacco use.     6. Support services/counseling: It is not uncommon for this period of the patient's cancer care trajectory to be one of many emotions and stressors.  She was given information regarding our available services and encouraged to contact me with any questions or for help enrolling in any of our support group/programs.    Follow up instructions:    -Return to cancer center in 6 months for f/u with Dr. GLindi Adie -Mammogram due in 08/2022 -Follow up with surgery 1 year -She is welcome to return back to the Survivorship Clinic at any time; no additional follow-up needed at this time.  -Consider referral back to survivorship as a long-term survivor for continued surveillance  The patient was provided an opportunity to ask questions and all were answered. The patient agreed with the plan and demonstrated an understanding of the instructions.   Total encounter time:60 minutes*in face-to-face visit time, chart review, lab review, care coordination, order entry, and documentation of the encounter time.    LWilber Bihari NP 05/15/22 6:49 PM Medical Oncology and Hematology [facility] [id number]. [phone]   Fax. [phone] *Total Encounter Time as defined by the Centers for Medicare and Medicaid Services includes, in addition to the face-to-face time of a patient visit (documented in the note above) non-face-to-face time: obtaining and reviewing outside history, ordering and reviewing medications, tests or procedures, care coordination (communications with other health care professionals or caregivers) and documentation in the medical record.

## 2022-05-21 ENCOUNTER — Ambulatory Visit: Payer: Medicare PPO | Attending: General Surgery

## 2022-05-21 VITALS — Wt 152.1 lb

## 2022-05-21 DIAGNOSIS — Z483 Aftercare following surgery for neoplasm: Secondary | ICD-10-CM | POA: Insufficient documentation

## 2022-05-21 NOTE — Therapy (Signed)
  OUTPATIENT PHYSICAL THERAPY SOZO SCREENING NOTE   Patient Name: Deborah Dougherty MRN: [id number] DOB:03-05-1946, 76 y.o., female Today's Date: 05/21/2022  PCP: Burnard Bunting, MD REFERRING PROVIDER: Burnard Bunting, MD   PT End of Session - 05/21/22 1555     Visit Number 2   # unchanged due to screen only   PT Start Time 4098    PT Stop Time 1557    PT Time Calculation (min) 4 min    Activity Tolerance Patient tolerated treatment well    Behavior During Therapy [facility] for tasks assessed/performed             Past Medical History:  Diagnosis Date   Anxiety    Arthritis    hands, wrists, hips and low back   Cancer ([place]) 09/2021   right breast IDC   Depression    History of radiation therapy    Right breast 11/21/21-12/19/21- Dr. Gery Pray   Past Surgical History:  Procedure Laterality Date   ABDOMINAL HYSTERECTOMY  1998   BREAST LUMPECTOMY WITH RADIOACTIVE SEED AND SENTINEL LYMPH NODE BIOPSY Right 10/20/2021   Procedure: RIGHT BREAST LUMPECTOMY WITH RADIOACTIVE SEED AND SENTINEL LYMPH NODE BIOPSY;  Surgeon: Jovita Kussmaul, MD;  Location: [place];  Service: General;  Laterality: Right;   [place] Left 12/09/2020   Procedure: TOTAL HIP ARTHROPLASTY ANTERIOR APPROACH;  Surgeon: Rod Can, MD;  [facility];  Service: Orthopedics;  Laterality: Left;   WRIST SURGERY  2010   Patient Active Problem List   Diagnosis Date Noted   Genetic testing 09/29/2021   Family history of breast cancer 09/21/2021   Malignant neoplasm of upper-inner quadrant of right breast in female, estrogen receptor positive ([place]) 09/18/2021   Hip fracture ([place]) 12/08/2020    REFERRING DIAG: right breast cancer at risk for lymphedema  THERAPY DIAG: Aftercare following surgery for neoplasm  PERTINENT HISTORY: Patient was diagnosed on 08/10/2021 with right grade II invasive ductal carcinoma breast cancer. It is ER/PR positive and HER2  negative with a Ki67 of 10%. Patient underwent a right lumpectomy and sentinel node biopsy on 10/20/2021 with 3 negative lymph nodes removed. She has hardware in her left wrist from a surgery after a fall and had her left hip replaced on 12/09/2020.  PRECAUTIONS: right UE Lymphedema risk, None  SUBJECTIVE: Pt returns for her 3 month L-Dex screen.   PAIN:  Are you having pain? No  SOZO SCREENING: Patient was assessed today using the SOZO machine to determine the lymphedema index score. This was compared to her baseline score. It was determined that she is within the recommended range when compared to her baseline and no further action is needed at this time. She will continue SOZO screenings. These are done every 3 months for 2 years post operatively followed by every 6 months for 2 years, and then annually.   L-DEX FLOWSHEETS - 05/21/22 1500       L-DEX LYMPHEDEMA SCREENING   Measurement Type Unilateral    L-DEX MEASUREMENT EXTREMITY Upper Extremity    POSITION  Standing    DOMINANT SIDE Right    At Risk Side Right    BASELINE SCORE (UNILATERAL) -2.2    L-DEX SCORE (UNILATERAL) -0.4    VALUE CHANGE (UNILAT) 1.8              Otelia Limes, PTA 05/21/2022, 3:58 PM

## 2022-06-12 DIAGNOSIS — D2262 Melanocytic nevi of left upper limb, including shoulder: Secondary | ICD-10-CM | POA: Diagnosis not present

## 2022-06-12 DIAGNOSIS — L218 Other seborrheic dermatitis: Secondary | ICD-10-CM | POA: Diagnosis not present

## 2022-06-12 DIAGNOSIS — L738 Other specified follicular disorders: Secondary | ICD-10-CM | POA: Diagnosis not present

## 2022-06-12 DIAGNOSIS — D225 Melanocytic nevi of trunk: Secondary | ICD-10-CM | POA: Diagnosis not present

## 2022-06-12 DIAGNOSIS — L918 Other hypertrophic disorders of the skin: Secondary | ICD-10-CM | POA: Diagnosis not present

## 2022-06-12 DIAGNOSIS — L814 Other melanin hyperpigmentation: Secondary | ICD-10-CM | POA: Diagnosis not present

## 2022-06-12 DIAGNOSIS — L820 Inflamed seborrheic keratosis: Secondary | ICD-10-CM | POA: Diagnosis not present

## 2022-06-12 DIAGNOSIS — L821 Other seborrheic keratosis: Secondary | ICD-10-CM | POA: Diagnosis not present

## 2022-06-12 DIAGNOSIS — D1801 Hemangioma of skin and subcutaneous tissue: Secondary | ICD-10-CM | POA: Diagnosis not present

## 2022-07-09 DIAGNOSIS — H2000 Unspecified acute and subacute iridocyclitis: Secondary | ICD-10-CM | POA: Diagnosis not present

## 2022-07-09 DIAGNOSIS — H5711 Ocular pain, right eye: Secondary | ICD-10-CM | POA: Diagnosis not present

## 2022-07-20 DIAGNOSIS — H5711 Ocular pain, right eye: Secondary | ICD-10-CM | POA: Diagnosis not present

## 2022-07-20 DIAGNOSIS — R51 Headache with orthostatic component, not elsewhere classified: Secondary | ICD-10-CM | POA: Diagnosis not present

## 2022-07-20 DIAGNOSIS — H5340 Unspecified visual field defects: Secondary | ICD-10-CM | POA: Diagnosis not present

## 2022-07-25 DIAGNOSIS — H532 Diplopia: Secondary | ICD-10-CM | POA: Diagnosis not present

## 2022-07-25 DIAGNOSIS — H5710 Ocular pain, unspecified eye: Secondary | ICD-10-CM | POA: Diagnosis not present

## 2022-07-26 ENCOUNTER — Other Ambulatory Visit (HOSPITAL_COMMUNITY): Payer: Self-pay | Admitting: Internal Medicine

## 2022-07-26 ENCOUNTER — Ambulatory Visit (HOSPITAL_COMMUNITY)
Admission: RE | Admit: 2022-07-26 | Discharge: 2022-07-26 | Disposition: A | Discharge status: Home / Self Care | Payer: Medicare PPO | Source: Ambulatory Visit | Attending: Internal Medicine | Admitting: Internal Medicine

## 2022-07-26 DIAGNOSIS — H532 Diplopia: Secondary | ICD-10-CM | POA: Diagnosis not present

## 2022-07-26 DIAGNOSIS — H5710 Ocular pain, unspecified eye: Secondary | ICD-10-CM | POA: Insufficient documentation

## 2022-07-26 DIAGNOSIS — R519 Headache, unspecified: Secondary | ICD-10-CM | POA: Diagnosis not present

## 2022-07-26 MED ORDER — GADOBUTROL 1 MMOL/ML IV SOLN
7.0000 mL | Freq: Once | INTRAVENOUS | Status: AC | PRN
  Administered 2022-07-26: 7 mL via INTRAVENOUS

## 2022-07-31 DIAGNOSIS — H532 Diplopia: Secondary | ICD-10-CM | POA: Diagnosis not present

## 2022-08-01 ENCOUNTER — Other Ambulatory Visit (HOSPITAL_COMMUNITY)
Admission: RE | Admit: 2022-08-01 | Discharge: 2022-08-01 | Disposition: A | Discharge status: Home / Self Care | Payer: Medicare PPO | Source: Other Acute Inpatient Hospital | Attending: Adult Health | Admitting: Adult Health

## 2022-08-01 DIAGNOSIS — C50211 Malignant neoplasm of upper-inner quadrant of right female breast: Secondary | ICD-10-CM | POA: Insufficient documentation

## 2022-08-01 DIAGNOSIS — Z170 Estrogen receptor positive status [ER+]: Secondary | ICD-10-CM | POA: Diagnosis not present

## 2022-08-01 LAB — TSH: TSH: 1.034 u[IU]/mL (ref 0.350–4.500)

## 2022-08-01 LAB — CBC
HCT: 35.1 % — ABNORMAL LOW (ref 36.0–46.0)
Hemoglobin: 11.4 g/dL — ABNORMAL LOW (ref 12.0–15.0)
MCH: 31.2 pg (ref 26.0–34.0)
MCHC: 32.5 g/dL (ref 30.0–36.0)
MCV: 96.2 fL (ref 80.0–100.0)
Platelets: 245 10*3/uL (ref 150–400)
RBC: 3.65 MIL/uL — ABNORMAL LOW (ref 3.87–5.11)
RDW: 12.9 % (ref 11.5–15.5)
WBC: 7.5 10*3/uL (ref 4.0–10.5)
nRBC: 0 % (ref 0.0–0.2)

## 2022-08-01 LAB — C-REACTIVE PROTEIN: CRP: <0.5 mg/dL (ref <1.0)

## 2022-08-01 LAB — SEDIMENTATION RATE: Sed Rate: 10 mm/hr (ref 0–22)

## 2022-08-06 DIAGNOSIS — E559 Vitamin D deficiency, unspecified: Secondary | ICD-10-CM | POA: Diagnosis not present

## 2022-08-09 LAB — MISC LABCORP TEST (SEND OUT): Labcorp test code: 165592

## 2022-08-10 LAB — MISC LABCORP TEST (SEND OUT): Labcorp test code: 86020

## 2022-08-15 DIAGNOSIS — H5340 Unspecified visual field defects: Secondary | ICD-10-CM | POA: Diagnosis not present

## 2022-08-15 DIAGNOSIS — H532 Diplopia: Secondary | ICD-10-CM | POA: Diagnosis not present

## 2022-08-21 DIAGNOSIS — H5203 Hypermetropia, bilateral: Secondary | ICD-10-CM | POA: Diagnosis not present

## 2022-08-21 DIAGNOSIS — H532 Diplopia: Secondary | ICD-10-CM | POA: Diagnosis not present

## 2022-08-21 DIAGNOSIS — H2513 Age-related nuclear cataract, bilateral: Secondary | ICD-10-CM | POA: Diagnosis not present

## 2022-08-27 ENCOUNTER — Ambulatory Visit: Payer: Medicare PPO | Attending: General Surgery

## 2022-08-27 VITALS — Wt 146.2 lb

## 2022-08-27 DIAGNOSIS — Z483 Aftercare following surgery for neoplasm: Secondary | ICD-10-CM | POA: Insufficient documentation

## 2022-08-27 NOTE — Therapy (Signed)
  OUTPATIENT PHYSICAL THERAPY SOZO SCREENING NOTE   Patient Name: Deborah Dougherty MRN: [id number] DOB:1946-08-22, 76 y.o., female Today's Date: 08/27/2022  PCP: Burnard Bunting, MD REFERRING PROVIDER: Jovita Kussmaul, MD   PT End of Session - 08/27/22 1508     Visit Number 2   # unchanged due to screen only   PT Start Time 1506    PT Stop Time 1511    PT Time Calculation (min) 5 min    Activity Tolerance Patient tolerated treatment well    Behavior During Therapy [facility] for tasks assessed/performed             Past Medical History:  Diagnosis Date   Anxiety    Arthritis    hands, wrists, hips and low back   Cancer ([place]) 09/2021   right breast IDC   Depression    History of radiation therapy    Right breast 11/21/21-12/19/21- Dr. Gery Pray   Past Surgical History:  Procedure Laterality Date   ABDOMINAL HYSTERECTOMY  1998   BREAST LUMPECTOMY WITH RADIOACTIVE SEED AND SENTINEL LYMPH NODE BIOPSY Right 10/20/2021   Procedure: RIGHT BREAST LUMPECTOMY WITH RADIOACTIVE SEED AND SENTINEL LYMPH NODE BIOPSY;  Surgeon: Jovita Kussmaul, MD;  Location: [place];  Service: General;  Laterality: Right;   [place] Left 12/09/2020   Procedure: TOTAL HIP ARTHROPLASTY ANTERIOR APPROACH;  Surgeon: Rod Can, MD;  [facility];  Service: Orthopedics;  Laterality: Left;   WRIST SURGERY  2010   Patient Active Problem List   Diagnosis Date Noted   Genetic testing 09/29/2021   Family history of breast cancer 09/21/2021   Malignant neoplasm of upper-inner quadrant of right breast in female, estrogen receptor positive ([place]) 09/18/2021   Hip fracture ([place]) 12/08/2020    REFERRING DIAG: right breast cancer at risk for lymphedema  THERAPY DIAG: Aftercare following surgery for neoplasm  PERTINENT HISTORY: Patient was diagnosed on 08/10/2021 with right grade II invasive ductal carcinoma breast cancer. It is ER/PR positive and HER2  negative with a Ki67 of 10%. Patient underwent a right lumpectomy and sentinel node biopsy on 10/20/2021 with 3 negative lymph nodes removed. She has hardware in her left wrist from a surgery after a fall and had her left hip replaced on 12/09/2020.  PRECAUTIONS: right UE Lymphedema risk, None  SUBJECTIVE: Pt returns for her 3 month L-Dex screen.   PAIN:  Are you having pain? No  SOZO SCREENING: Patient was assessed today using the SOZO machine to determine the lymphedema index score. This was compared to her baseline score. It was determined that she is within the recommended range when compared to her baseline and no further action is needed at this time. She will continue SOZO screenings. These are done every 3 months for 2 years post operatively followed by every 6 months for 2 years, and then annually.   L-DEX FLOWSHEETS - 08/27/22 1500       L-DEX LYMPHEDEMA SCREENING   Measurement Type Unilateral    L-DEX MEASUREMENT EXTREMITY Upper Extremity    POSITION  Standing    DOMINANT SIDE Right    At Risk Side Right    BASELINE SCORE (UNILATERAL) -2.2    L-DEX SCORE (UNILATERAL) -2.8    VALUE CHANGE (UNILAT) -0.6              Otelia Limes, PTA 08/27/2022, 3:09 PM

## 2022-10-11 ENCOUNTER — Encounter: Payer: Self-pay | Admitting: Neurology

## 2022-10-11 ENCOUNTER — Ambulatory Visit: Payer: Medicare PPO | Admitting: Neurology

## 2022-10-11 VITALS — BP 107/65 | HR 75 | Ht 66.5 in | Wt 147.0 lb

## 2022-10-11 DIAGNOSIS — H532 Diplopia: Secondary | ICD-10-CM | POA: Diagnosis not present

## 2022-10-11 NOTE — Progress Notes (Signed)
Provider:  Larey Seat, M D  Referring Provider: Melissa Noon, [place] Primary Care Physician:  Burnard Bunting , MD   Chief Complaint  Patient presents with   Neurologic Problem    Pt with brother in law, rm 1. Before Nov 16th 2023 had woken with rt eye pain. Went to ophthalmologist -there was no signs of increased pressure. Given anti-inflammatory mediation, steroid, after 1 wk, no improvement started abx. Took Advil at home daily.  After 2 weeks developed double vision in right eye but has gradually improved. [facility] MRI brain was negative completed  07-26-2022 along with blood work.  She quit drinking in July 2023 and started tylenol PM in order to sleep.     HPI:  Deborah Dougherty is a 77 y.o. female patient , right handed, caucasian,  and was first and last time seen on 02-19-2020 upon a referral from Dr. Delman Cheadle, OD, for a facial sensory abnormality. She now returns for a myasthenia gravis wok up on 10-11-2022; we had no interval contact. The patient is here for a new problem: DIPLOPIA> onset early November waking up with eye pain .  First eye pain developed and she ws given ATB and steroids , which made the pain less but DIPLOPIA set on after steroid.  There was no intra ocular or intraorbital pressure, MRI from 07/26/2022 showed a normal optic nerve.  Headaches and eye pain and diplopia have improved. She still has slightly skewed diplopia with gaze to the right.  Last eye clinic follow up in late November. She was given a prism eye glas.  Patient underwent at [facility] long MRI MRI brain with and without contrast on 26 July 2022 authorized by Dr. Burnard Bunting.  There was no acute infarction no hemorrhage no hydrocephalus etc. chronic small vessel ischemia, this was found to be stable in comparison to a study from 2021.  No visible inflammation.  This was a normal MRI brain it did not explain the diplopia.  In 2021 I had ordered a cervical spine imaged which told that she has multiple  level osteophytes or bone spurs which compressed moderately and at some spaces even severely the foraminal exit for the C4 nerve root C4 V nerve roots.  Hospital labs reviewed. Labs for Acetylcholin rec Ab were negative, MUSK negative and she was mildly anemic. No diabetes no thyroid disease.  She appears to have d significant memory problems.       03-15-2020.  The patient was referred by her dermatologist and not PCP today, she is a talkative, pleasant person and described the onset of the symptoms to be evaluated here today at 18 month ago, before [place] struck. She tried creams and ointments, no success.  She describes that she had an itching sensation without visible rash or blisters under the left jawline.  The year over months following the initial manifestation the area of itching extended towards the left ear and along the hairline. Dr. Ubaldo Glassing did not note any kind of rash.  There are Dr. Ubaldo Glassing now localized the itching to the left mid medial occipital scalp and she stated that she found a well-defined erythematous scaly plaque in that area.  The patient was referred here after she discussed gabapentin medication. She reported a creepy crawly sensation under the unblemished skin.    [name] reports that she has been living alone since her husband passed in 2018 and also she has a bachelor's degree she has not been an active  member of the workforce.  She describes herself as being inexperienced in many years of her having her husband handling financial affairs, driving her, making sure that bills are paid -and making all decisions. Before she married at age 12 and just out of college, until then her mother had regulated her life. Never smoker, never ETOH abuse, no caffeine overuse.   Review of Systems: Out of a complete 14 system review, the patient complains of only the following symptoms, and all other reviewed systems are negative.  No problems with dexterity, balance, change of  vision, penmanship, loss of taste or loss of smell.  Diplopia - with gaze to the right ,   Social History   Socioeconomic History   Marital status: Widowed    Spouse name: Not on file   Number of children: 1   Years of education: Not on file   Highest education level: Bachelor's degree (e.g., BA, AB, BS)  Occupational History   Not on file  Tobacco Use   Smoking status: Former    Types: Cigarettes    Quit date: 1969    Years since quitting: 55.1   Smokeless tobacco: Never   Tobacco comments:    quit 1969  Substance and Sexual Activity   Alcohol use: Yes    Comment: occasionally, 6 oz daily   Drug use: Never   Sexual activity: Not Currently    Birth control/protection: Surgical  Other Topics Concern   Not on file  Social History Narrative   Lives alone   Caffeine 10 oz daily   Social Determinants of Health   Financial Resource Strain: Not on file  Food Insecurity: Not on file  Transportation Needs: Not on file  Physical Activity: Not on file  Stress: Not on file  Social Connections: Not on file  Intimate Partner Violence: Not on file    Family History  Problem Relation Age of Onset   Breast cancer Mother 47   Lung cancer Mother        breast cancer metastasized to lung   Stomach cancer Father 50   Mesothelioma Brother     Past Medical History:  Diagnosis Date   Anxiety    Arthritis    hands, wrists, hips and low back   Cancer ([place]) 09/2021   right breast IDC   Depression    History of radiation therapy    Right breast 11/21/21-12/19/21- Dr. Gery Pray    Current Outpatient Medications  Medication Sig Dispense Refill   acetaminophen (TYLENOL) 325 MG tablet Take 2 tablets (650 mg total) by mouth every 6 (six) hours as needed for mild pain or headache. (Patient taking differently: Take 650 mg by mouth as needed for mild pain or headache.)     bisacodyl (DULCOLAX) 5 MG EC tablet Take 5 mg by mouth as needed for moderate constipation. (Patient not  taking: Reported on 01/18/2022)     cholecalciferol (VITAMIN D3) 25 MCG (1000 UNIT) tablet Take 1 tablet (1,000 Units total) by mouth daily.     gabapentin (NEURONTIN) 100 MG capsule Take 1 capsule (100 mg total) by mouth 2 (two) times daily.     ibuprofen (ADVIL) 200 MG tablet Take 200 mg by mouth every 6 (six) hours as needed.     letrozole (FEMARA) 2.5 MG tablet Take 1 tablet (2.5 mg total) by mouth daily. 90 tablet 3   naproxen sodium (ALEVE) 220 MG tablet Take 220 mg by mouth as needed. (Patient not taking: Reported on 01/18/2022)  PARoxetine (PAXIL) 20 MG tablet Take 20 mg by mouth daily. (Patient not taking: Reported on 05/15/2022)     vitamin B-12 (CYANOCOBALAMIN) 1000 MCG tablet Take 1 tablet (1,000 mcg total) by mouth daily. (Patient taking differently: Take 1,000 mcg by mouth once a week.)     No current facility-administered medications for this visit.    Allergies as of 10/11/2022 - Review Complete 05/15/2022  Allergen Reaction Noted   Cefdinir Other (See Comments) 10/12/2021    Vitals: BP 107/65   Pulse 75   Ht 5' 6.5" (1.689 m)   Wt 147 lb (66.7 kg)   BMI 23.37 kg/m  Last Weight:  Wt Readings from Last 1 Encounters:  10/11/22 147 lb (66.7 kg)   Last Height:   Ht Readings from Last 1 Encounters:  10/11/22 5' 6.5" (1.689 m)    Physical exam:  General: The patient is awake, alert and appears not in acute distress. The patient is well groomed. Head: Normocephalic, atraumatic. Neck is supple.  Mallampati 1, midline  neck circumference: 13.5" Cardiovascular:  Regular rate and rhythm , without  murmurs or carotid bruit, and without distended neck veins. Respiratory: Lungs are clear to auscultation. Skin:  Without evidence of edema, or rash Trunk: BMI is 25.53 - patient  has a normal posture.  Neurologic exam : The patient is awake and alert, oriented to place and time.  Memory subjective described as intact. There is a reduced attention span & concentration ability.  Speech is fluent, almost pressured,  without dysarthria, dysphonia or aphasia.  Mood and affect are slightly anxious.   Cranial nerves:  DIPLOPIA>  red lens test, the  right eye projects right lower image of the finger, not horizontal .  Pupils are equal and briskly reactive to light. Funduscopic exam without  evidence of pallor or edema.  Extraocular movements  in vertical and horizontal planes intact and without nystagmus.  Visual fields by finger perimetry are intact. Hearing to finger rub intact.   Facial sensation intact to fine touch and there is no trigger point for pain. . Facial motor strength is symmetric and tongue and uvula move midline. Tongue protrusion into either cheek is normal. Shoulder shrug is normal.    Indicated the left occipital nerve area as non tender, but paraspinal left are C 3-4 .   Motor exam:   Normal tone ,muscle bulk and symmetric  strength in all extremities.  Sensory:  Fine touch, pinprick and vibration were tested in all extremities. Proprioception was normal.  Coordination: Rapid alternating movements in the fingers/hands were normal.  Finger-to-nose maneuver  normal without evidence of ataxia, dysmetria or tremor.  Gait and station: Patient walks without assistive device and is able unassisted to climb up to the exam table. Strength within normal limits. Stance is stable and normal. Tandem gait is unfragmented.   Deep tendon reflexes: in the  upper and lower extremities are symmetric and intact. downgoing.   Assessment:  After physical and neurologic examination, review of laboratory studies, imaging, neurophysiology testing and pre-existing records, assessment is that of :   We reviewed the next MRI of the brain with and without contrast the images her below be look fairly symmetric there is no sign of an asymmetric pressure impact from the outside, the optic nerve appears to be uninflamed, there is no edema that I can notice.  The muscles insert  and look inflamed and nonerythematous as well.  There is no sign of a stroke or small stroke in  the area that would control eye movement.  Review of the laboratory test shows no evidence of myasthenia antibodies, musk antibodies, only mild anemia and also a lower white blood cell count.  The patient was not struggling with any kind of infection or viral illness at the time when she woke up with her diplopia and eye pain.  The red lens test shows a skewed diplopia with the right eyes projected image to be to the right but also lower than the healthy left image.  Since this has been improving and is truly only present at the very end of the horizontal eye movement spectrum I think she is continuing to improve from here on.  We discussed some other medication and lifestyle changes I would like for her not to take Tylenol PM also because it has so many cognitive side effects for long-term use.  She is on gabapentin but often forgets the nighttime dose and takes it only in the morning I would like for the patient to try and change it to exactly taking it at night it may help her to sleep it may help her nerve pain and toe pain.  It also hopefully helps her to reduce the need for NSAIDs.     Plan:  I cannot correlate the diplopia with any brainstem injury, any supranuclear brain lesion in this case.   She has no obvious balance problems and can read.   My suggestion is to obtain new glasses with prism.   I urged her not to drive at night or with poor visability.   No mestinon indication at this point.  Rv prn.   Asencion Partridge Flint Hakeem MD 10/11/2022

## 2022-10-11 NOTE — Patient Instructions (Signed)
Diplopia Diplopia is a condition in which a person sees two of a single object. It is also called double vision. There are two types of diplopia. Monocular diplopia. This is double vision that is present when only one eye is open. It can occur in one or both eyes. Monocular diplopia is often caused by irregularity in the surface layer of your eye (cornea), a clouding of the lens in your eye (cataract), or a problem in the way your eye focuses light. Binocular diplopia. This is double vision that is present only when both eyes are open. When you close one eye, the double vision will go away. Binocular diplopia may be more serious. It can be caused by: Problems with the nerves or muscles that are responsible for eye movement. Disease of the nerves (neurologic disease). Immune system conditions, such as Graves' disease. Migraine headaches. Tumors. An infection. A stroke. An injury. There are many causes of diplopia. Some are not dangerous and can be easily corrected. Diplopia may also be a symptom of a serious medical problem. You may need to see a health care provider who specializes in eye conditions (ophthalmologist) or a nerve specialist (neurologist) to find the cause. Follow these instructions at home:  Pay attention to any changes in your vision. Tell your health care provider about them. Do not drive or operate machinery if diplopia interferes with your vision. Keep all follow-up visits. This is important. Contact a health care provider if: Your diplopia gets worse. You develop any other symptoms along with your diplopia, such as: Weakness. Numbness. Headache. Eye pain. Clumsiness. Nausea. Drooping eyelids. Abnormal movement of one eye. Get help right away if: You have sudden vision loss. You suddenly get a very bad headache. You have sudden weakness or numbness. You develop droopiness on one side of your face. You suddenly lose the ability to speak, understand speech, or  both. You develop difficulty breathing. These symptoms may represent a serious problem that is an emergency. Do not wait to see if the symptoms will go away. Get medical help right away. Call your local emergency services (911 in the [place] Do not drive yourself to the hospital. Summary Diplopia is a condition in which a person sees two of a single object. It is also called double vision. Monocular diplopia is double vision that affects only one eye at a time. It is often caused by irregularity in the surface layer of your eye (cornea), a clouding of the lens in your eye (cataract), or a problem in the way your eye focuses light. Binocular diplopia is double vision that affects both eyes at the same time. However, when you shut one eye, the double vision will go away. Binocular diplopia may be more serious. If you have diplopia, you may need to see a health care provider who specializes in eye conditions (ophthalmologist) or a nerve specialist (neurologist) to find the cause. This information is not intended to replace advice given to you by your health care provider. Make sure you discuss any questions you have with your health care provider. Document Revised: 12/29/2020 Document Reviewed: 12/29/2020 Elsevier Patient Education  [place].

## 2022-10-22 DIAGNOSIS — R928 Other abnormal and inconclusive findings on diagnostic imaging of breast: Secondary | ICD-10-CM | POA: Diagnosis not present

## 2022-10-29 DIAGNOSIS — R030 Elevated blood-pressure reading, without diagnosis of hypertension: Secondary | ICD-10-CM | POA: Diagnosis not present

## 2022-10-29 DIAGNOSIS — K219 Gastro-esophageal reflux disease without esophagitis: Secondary | ICD-10-CM | POA: Diagnosis not present

## 2022-10-29 DIAGNOSIS — Z1212 Encounter for screening for malignant neoplasm of rectum: Secondary | ICD-10-CM | POA: Diagnosis not present

## 2022-10-29 DIAGNOSIS — E559 Vitamin D deficiency, unspecified: Secondary | ICD-10-CM | POA: Diagnosis not present

## 2022-10-29 DIAGNOSIS — E785 Hyperlipidemia, unspecified: Secondary | ICD-10-CM | POA: Diagnosis not present

## 2022-10-29 DIAGNOSIS — R7989 Other specified abnormal findings of blood chemistry: Secondary | ICD-10-CM | POA: Diagnosis not present

## 2022-11-05 ENCOUNTER — Encounter: Payer: Self-pay | Admitting: Family Medicine

## 2022-11-05 ENCOUNTER — Other Ambulatory Visit: Payer: Self-pay | Admitting: Internal Medicine

## 2022-11-05 DIAGNOSIS — H532 Diplopia: Secondary | ICD-10-CM | POA: Diagnosis not present

## 2022-11-05 DIAGNOSIS — M8580 Other specified disorders of bone density and structure, unspecified site: Secondary | ICD-10-CM | POA: Diagnosis not present

## 2022-11-05 DIAGNOSIS — Z1331 Encounter for screening for depression: Secondary | ICD-10-CM | POA: Diagnosis not present

## 2022-11-05 DIAGNOSIS — E785 Hyperlipidemia, unspecified: Secondary | ICD-10-CM | POA: Diagnosis not present

## 2022-11-05 DIAGNOSIS — Z0000 Encounter for general adult medical examination without abnormal findings: Secondary | ICD-10-CM | POA: Diagnosis not present

## 2022-11-05 DIAGNOSIS — Z23 Encounter for immunization: Secondary | ICD-10-CM | POA: Diagnosis not present

## 2022-11-05 DIAGNOSIS — R202 Paresthesia of skin: Secondary | ICD-10-CM | POA: Diagnosis not present

## 2022-11-05 DIAGNOSIS — R82998 Other abnormal findings in urine: Secondary | ICD-10-CM | POA: Diagnosis not present

## 2022-11-05 DIAGNOSIS — Z1339 Encounter for screening examination for other mental health and behavioral disorders: Secondary | ICD-10-CM | POA: Diagnosis not present

## 2022-11-13 ENCOUNTER — Encounter: Payer: Self-pay | Admitting: Adult Health

## 2022-11-13 ENCOUNTER — Inpatient Hospital Stay: Payer: Medicare PPO | Attending: Adult Health | Admitting: Adult Health

## 2022-11-13 VITALS — BP 131/70 | HR 59 | Temp 97.6°F | Resp 14 | Wt 143.6 lb

## 2022-11-13 DIAGNOSIS — Z801 Family history of malignant neoplasm of trachea, bronchus and lung: Secondary | ICD-10-CM | POA: Insufficient documentation

## 2022-11-13 DIAGNOSIS — Z170 Estrogen receptor positive status [ER+]: Secondary | ICD-10-CM | POA: Insufficient documentation

## 2022-11-13 DIAGNOSIS — M85851 Other specified disorders of bone density and structure, right thigh: Secondary | ICD-10-CM | POA: Insufficient documentation

## 2022-11-13 DIAGNOSIS — Z79811 Long term (current) use of aromatase inhibitors: Secondary | ICD-10-CM | POA: Diagnosis not present

## 2022-11-13 DIAGNOSIS — C50211 Malignant neoplasm of upper-inner quadrant of right female breast: Secondary | ICD-10-CM | POA: Diagnosis not present

## 2022-11-13 DIAGNOSIS — Z923 Personal history of irradiation: Secondary | ICD-10-CM | POA: Insufficient documentation

## 2022-11-13 DIAGNOSIS — Z803 Family history of malignant neoplasm of breast: Secondary | ICD-10-CM | POA: Diagnosis not present

## 2022-11-13 DIAGNOSIS — Z87891 Personal history of nicotine dependence: Secondary | ICD-10-CM | POA: Insufficient documentation

## 2022-11-13 DIAGNOSIS — Z800 Family history of malignant neoplasm of digestive organs: Secondary | ICD-10-CM | POA: Diagnosis not present

## 2022-11-13 NOTE — Assessment & Plan Note (Signed)
Deborah Dougherty is a 77 year old woman with history of stage Ia ER/PR positive breast cancer diagnosed in January 2023.  She is status postlumpectomy, adjuvant radiation, and antiestrogen therapy with letrozole.  Deborah Dougherty has no clinical or radiographic signs of breast cancer recurrence.  She will continue to undergo annual mammograms next due in February 2025.  I recommended that she continue to take her letrozole as she is tolerating it well.  She does have a history of osteopenia and I recommended repeat bone density testing in July 2024 that can be completed with Dr. Joya Salm at [facility] since that is where she has had it completed in the past.  I recommended continued healthy diet and exercise and to stay up-to-date with her cancer screenings.  She should see her surgeon in 6 months and we can see her back in 1 year for continued follow-up and surveillance.  She knows to call if anything arises between now and her next appointment.

## 2022-11-13 NOTE — Progress Notes (Signed)
[facility] Follow up:    Burnard Bunting, MD [place] [address]   DIAGNOSIS:  Cancer Staging  Malignant neoplasm of upper-inner quadrant of right breast in female, estrogen receptor positive ([place]) Staging form: Breast, AJCC 8th Edition - Clinical stage from 09/20/2021: Stage IA (cT1c, cN0, cM0, G2, ER+, PR+, HER2-) - Signed by Nicholas Lose, MD on 09/20/2021 Stage prefix: Initial diagnosis Histologic grading system: 3 grade system   SUMMARY OF ONCOLOGIC HISTORY: Oncology History  Malignant neoplasm of upper-inner quadrant of right breast in female, estrogen receptor positive ([place])  09/14/2021 Initial Diagnosis   Screening mammogram detected right upper medial quadrant mass 1.4 cm spiculated mass by mammogram.  By ultrasound it measured 1 cm axilla negative, biopsy revealed grade 1-2 IDC ER 100%, PR 80%, HER2 negative, Ki-67 10%   09/20/2021 Cancer Staging   Staging form: Breast, AJCC 8th Edition - Clinical stage from 09/20/2021: Stage IA (cT1c, cN0, cM0, G2, ER+, PR+, HER2-) - Signed by Nicholas Lose, MD on 09/20/2021 Stage prefix: Initial diagnosis Histologic grading system: 3 grade system    Genetic Testing   [facility] CancerNext-Expanded is Negative. Report date is 10/02/2021.  The CancerNext-Expanded gene panel offered by [facility] and includes sequencing, rearrangement, and RNA analysis for the following 77 genes: AIP, ALK, APC, ATM, AXIN2, BAP1, BARD1, BLM, BMPR1A, BRCA1, BRCA2, BRIP1, CDC73, CDH1, CDK4, CDKN1B, CDKN2A, CHEK2, CTNNA1, DICER1, FANCC, FH, FLCN, GALNT12, KIF1B, LZTR1, MAX, MEN1, MET, MLH1, MSH2, MSH3, MSH6, MUTYH, NBN, NF1, NF2, NTHL1, PALB2, PHOX2B, PMS2, POT1, PRKAR1A, PTCH1, PTEN, RAD51C, RAD51D, RB1, RECQL, RET, SDHA, SDHAF2, SDHB, SDHC, SDHD, SMAD4, SMARCA4, SMARCB1, SMARCE1, STK11, SUFU, TMEM127, TP53, TSC1, TSC2, VHL and XRCC2 (sequencing and deletion/duplication); EGFR, EGLN1, HOXB13, KIT, MITF, PDGFRA, POLD1, and POLE  (sequencing only); EPCAM and GREM1 (deletion/duplication only).    10/20/2021 Surgery   Right lumpectomy: 1.7 cm grade 3 IDC with high-grade DCIS, margins negative, 0/3 lymph nodes negative, ER 100%, PR 80%, HER2 negative (0), Ki-67 10%   11/22/2021 - 12/19/2021 Radiation Therapy   Site Technique Total Dose (Gy) Dose per Fx (Gy) Completed Fx Beam Energies  Breast, Right: Breast_R 3D 40.05/40.05 2.67 15/15 6X  Breast, Right: Breast_R_Bst specialPort 10/10 2 5/5 9E     01/09/2022 -  Anti-estrogen oral therapy   With leftAdjuvant antiestrogen therapy result with letrozole     CURRENT THERAPY: Letrozole  INTERVAL HISTORY: NAKEISHA BUNCH 77 y.o. female returns for above her history of breast cancer.  She continues on letrozole daily with good tolerance.  Her most recent mammogram occurred on October 22, 2022 demonstrating no mammographic evidence of malignancy and breast density category B.  She saw her PCP and is undergoing CT cardiac scoring scheduled on 11/30/2022.  This will determine if Yara needs a statin.  She tells me that she has experienced increased gas in her lower abdomen.  She wonders if she might need a colonoscopy.    Her most recent bone density testing occurred on April 06, 2021 demonstrating osteopenia with the lowest T-score in the right femoral neck which was -1.7.   Patient Active Problem List   Diagnosis Date Noted   Diplopia 10/11/2022   Genetic testing 09/29/2021   Family history of breast cancer 09/21/2021   Malignant neoplasm of upper-inner quadrant of right breast in female, estrogen receptor positive ([place]) 09/18/2021   Hip fracture ([place]) 12/08/2020    is allergic to cefdinir.  MEDICAL HISTORY: Past Medical History:  Diagnosis Date  Anxiety    Arthritis    hands, wrists, hips and low back   Cancer ([place]) 09/2021   right breast IDC   Depression    History of radiation therapy    Right breast 11/21/21-12/19/21- Dr. Gery Pray    SURGICAL  HISTORY: Past Surgical History:  Procedure Laterality Date   ABDOMINAL HYSTERECTOMY  1998   BREAST LUMPECTOMY WITH RADIOACTIVE SEED AND SENTINEL LYMPH NODE BIOPSY Right 10/20/2021   Procedure: RIGHT BREAST LUMPECTOMY WITH RADIOACTIVE SEED AND SENTINEL LYMPH NODE BIOPSY;  Surgeon: Jovita Kussmaul, MD;  Location: [facility];  Service: General;  Laterality: Right;   [place] Left 12/09/2020   Procedure: TOTAL HIP ARTHROPLASTY ANTERIOR APPROACH;  Surgeon: Rod Can, MD;  Location: [facility];  Service: Orthopedics;  Laterality: Left;   WRIST SURGERY  2010    SOCIAL HISTORY: Social History   Socioeconomic History   Marital status: Widowed    Spouse name: Not on file   Number of children: 1   Years of education: Not on file   Highest education level: Bachelor's degree (e.g., BA, AB, BS)  Occupational History   Not on file  Tobacco Use   Smoking status: Former    Types: Cigarettes    Quit date: 1969    Years since quitting: 55.2   Smokeless tobacco: Never   Tobacco comments:    quit 1969  Substance and Sexual Activity   Alcohol use: Yes    Comment: occasionally, 6 oz daily   Drug use: Never   Sexual activity: Not Currently    Birth control/protection: Surgical  Other Topics Concern   Not on file  Social History Narrative   Lives alone   Caffeine 10 oz daily   Social Determinants of Health   Financial Resource Strain: Not on file  Food Insecurity: Not on file  Transportation Needs: Not on file  Physical Activity: Not on file  Stress: Not on file  Social Connections: Not on file  Intimate Partner Violence: Not on file    FAMILY HISTORY: Family History  Problem Relation Age of Onset   Breast cancer Mother 51   Lung cancer Mother        breast cancer metastasized to lung   Stomach cancer Father 60   Mesothelioma Brother     Review of Systems  Constitutional:  Negative for appetite change, chills, fatigue, fever and  unexpected weight change.  HENT:   Negative for hearing loss, lump/mass and trouble swallowing.   Eyes:  Negative for eye problems and icterus.  Respiratory:  Negative for chest tightness, cough and shortness of breath.   Cardiovascular:  Negative for chest pain, leg swelling and palpitations.  Gastrointestinal:  Negative for abdominal distention, abdominal pain, constipation, diarrhea, nausea and vomiting.  Endocrine: Negative for hot flashes.  Genitourinary:  Negative for difficulty urinating.   Musculoskeletal:  Negative for arthralgias.  Skin:  Negative for itching and rash.  Neurological:  Negative for dizziness, extremity weakness, headaches and numbness.  Hematological:  Negative for adenopathy. Does not bruise/bleed easily.  Psychiatric/Behavioral:  Negative for depression. The patient is not nervous/anxious.       PHYSICAL EXAMINATION  ECOG PERFORMANCE STATUS: 1 - Symptomatic but completely ambulatory  Vitals:   11/13/22 1149  BP: 131/70  Pulse: (!) 59  Resp: 14  Temp: 97.6 F (36.4 C)  SpO2: 97%    Physical Exam Constitutional:      General: She is not  in acute distress.    Appearance: Normal appearance. She is not toxic-appearing.  HENT:     Head: Normocephalic and atraumatic.  Eyes:     General: No scleral icterus. Cardiovascular:     Rate and Rhythm: Normal rate and regular rhythm.     Pulses: Normal pulses.     Heart sounds: Normal heart sounds.  Pulmonary:     Effort: Pulmonary effort is normal.     Breath sounds: Normal breath sounds.  Chest:     Comments: Right breast status postlumpectomy and radiation no sign of local recurrence left breast is benign. Abdominal:     General: Abdomen is flat. Bowel sounds are normal. There is no distension.     Palpations: Abdomen is soft.     Tenderness: There is no abdominal tenderness.  Musculoskeletal:        General: No swelling.     Cervical back: Neck supple.  Lymphadenopathy:     Cervical: No cervical  adenopathy.  Skin:    General: Skin is warm and dry.     Findings: No rash.  Neurological:     General: No focal deficit present.     Mental Status: She is alert.  Psychiatric:        Mood and Affect: Mood normal.        Behavior: Behavior normal.     LABORATORY DATA: None for this visit  ASSESSMENT and THERAPY PLAN:   Malignant neoplasm of upper-inner quadrant of right breast in female, estrogen receptor positive ([place]) Vaughan Basta is a 77 year old woman with history of stage Ia ER/PR positive breast cancer diagnosed in January 2023.  She is status postlumpectomy, adjuvant radiation, and antiestrogen therapy with letrozole.  Vaughan Basta has no clinical or radiographic signs of breast cancer recurrence.  She will continue to undergo annual mammograms next due in February 2025.  I recommended that she continue to take her letrozole as she is tolerating it well.  She does have a history of osteopenia and I recommended repeat bone density testing in July 2024 that can be completed with Dr. Joya Salm at [facility] since that is where she has had it completed in the past.  I recommended continued healthy diet and exercise and to stay up-to-date with her cancer screenings.  She should see her surgeon in 6 months and we can see her back in 1 year for continued follow-up and surveillance.  She knows to call if anything arises between now and her next appointment.    All questions were answered. The patient knows to call the clinic with any problems, questions or concerns. We can certainly see the patient much sooner if necessary.  Total encounter time:30 minutes*in face-to-face visit time, chart review, lab review, care coordination, order entry, and documentation of the encounter time.  Wilber Bihari, NP 11/13/22 1:01 PM Medical Oncology and Hematology [facility] [place], [place] [address] Tel. [phone]    Fax. [phone]  *Total Encounter  Time as defined by the Centers for Medicare and Medicaid Services includes, in addition to the face-to-face time of a patient visit (documented in the note above) non-face-to-face time: obtaining and reviewing outside history, ordering and reviewing medications, tests or procedures, care coordination (communications with other health care professionals or caregivers) and documentation in the medical record.

## 2022-11-28 DIAGNOSIS — Z1211 Encounter for screening for malignant neoplasm of colon: Secondary | ICD-10-CM | POA: Diagnosis not present

## 2022-11-28 DIAGNOSIS — R519 Headache, unspecified: Secondary | ICD-10-CM | POA: Diagnosis not present

## 2022-11-28 DIAGNOSIS — N760 Acute vaginitis: Secondary | ICD-10-CM | POA: Diagnosis not present

## 2022-11-28 DIAGNOSIS — R143 Flatulence: Secondary | ICD-10-CM | POA: Diagnosis not present

## 2022-11-28 DIAGNOSIS — F520 Hypoactive sexual desire disorder: Secondary | ICD-10-CM | POA: Diagnosis not present

## 2022-11-30 ENCOUNTER — Ambulatory Visit
Admission: RE | Admit: 2022-11-30 | Discharge: 2022-11-30 | Disposition: A | Discharge status: Home / Self Care | Payer: No Typology Code available for payment source | Source: Ambulatory Visit | Attending: Internal Medicine | Admitting: Internal Medicine

## 2022-11-30 DIAGNOSIS — E785 Hyperlipidemia, unspecified: Secondary | ICD-10-CM

## 2022-12-03 ENCOUNTER — Ambulatory Visit: Payer: Medicare PPO | Attending: General Surgery

## 2022-12-03 VITALS — Wt 147.2 lb

## 2022-12-03 DIAGNOSIS — Z483 Aftercare following surgery for neoplasm: Secondary | ICD-10-CM | POA: Insufficient documentation

## 2022-12-03 NOTE — Therapy (Signed)
  OUTPATIENT PHYSICAL THERAPY SOZO SCREENING NOTE   Patient Name: Deborah Dougherty MRN: [id number] DOB:09/04/1946, 77 y.o., female Today's Date: 12/03/2022  PCP: Burnard Bunting, MD REFERRING PROVIDER: Burnard Bunting, MD   PT End of Session - 12/03/22 1558     Visit Number 2   # unchanged due to screen only   PT Start Time 1557    PT Stop Time 1601    PT Time Calculation (min) 4 min    Activity Tolerance Patient tolerated treatment well    Behavior During Therapy [facility] for tasks assessed/performed             Past Medical History:  Diagnosis Date   Anxiety    Arthritis    hands, wrists, hips and low back   Cancer ([place]) 09/2021   right breast IDC   Depression    History of radiation therapy    Right breast 11/21/21-12/19/21- Dr. Gery Pray   Past Surgical History:  Procedure Laterality Date   ABDOMINAL HYSTERECTOMY  1998   BREAST LUMPECTOMY WITH RADIOACTIVE SEED AND SENTINEL LYMPH NODE BIOPSY Right 10/20/2021   Procedure: RIGHT BREAST LUMPECTOMY WITH RADIOACTIVE SEED AND SENTINEL LYMPH NODE BIOPSY;  Surgeon: Jovita Kussmaul, MD;  Location: [facility];  Service: General;  Laterality: Right;   [place] Left 12/09/2020   Procedure: TOTAL HIP ARTHROPLASTY ANTERIOR APPROACH;  Surgeon: Rod Can, MD;  [facility];  Service: Orthopedics;  Laterality: Left;   WRIST SURGERY  2010   Patient Active Problem List   Diagnosis Date Noted   Diplopia 10/11/2022   Genetic testing 09/29/2021   Family history of breast cancer 09/21/2021   Malignant neoplasm of upper-inner quadrant of right breast in female, estrogen receptor positive ([place]) 09/18/2021   Hip fracture ([place]) 12/08/2020    REFERRING DIAG: right breast cancer at risk for lymphedema  THERAPY DIAG: Aftercare following surgery for neoplasm  PERTINENT HISTORY: Patient was diagnosed on 08/10/2021 with right grade II invasive ductal carcinoma breast cancer. It is  ER/PR positive and HER2 negative with a Ki67 of 10%. Patient underwent a right lumpectomy and sentinel node biopsy on 10/20/2021 with 3 negative lymph nodes removed. She has hardware in her left wrist from a surgery after a fall and had her left hip replaced on 12/09/2020.  PRECAUTIONS: right UE Lymphedema risk, None  SUBJECTIVE: Pt returns for her 3 month L-Dex screen.   PAIN:  Are you having pain? No  SOZO SCREENING: Patient was assessed today using the SOZO machine to determine the lymphedema index score. This was compared to her baseline score. It was determined that she is within the recommended range when compared to her baseline and no further action is needed at this time. She will continue SOZO screenings. These are done every 3 months for 2 years post operatively followed by every 6 months for 2 years, and then annually.   L-DEX FLOWSHEETS - 12/03/22 1500       L-DEX LYMPHEDEMA SCREENING   Measurement Type Unilateral    L-DEX MEASUREMENT EXTREMITY Upper Extremity    POSITION  Standing    DOMINANT SIDE Right    At Risk Side Right    BASELINE SCORE (UNILATERAL) -2.2    L-DEX SCORE (UNILATERAL) -4.5    VALUE CHANGE (UNILAT) -2.3              Otelia Limes, PTA 12/03/2022, 4:01 PM

## 2022-12-14 DIAGNOSIS — H5032 Intermittent alternating esotropia: Secondary | ICD-10-CM | POA: Diagnosis not present

## 2022-12-14 DIAGNOSIS — H532 Diplopia: Secondary | ICD-10-CM | POA: Diagnosis not present

## 2022-12-14 DIAGNOSIS — H5021 Vertical strabismus, right eye: Secondary | ICD-10-CM | POA: Diagnosis not present

## 2023-01-04 DIAGNOSIS — Z1211 Encounter for screening for malignant neoplasm of colon: Secondary | ICD-10-CM | POA: Diagnosis not present

## 2023-01-04 DIAGNOSIS — K5901 Slow transit constipation: Secondary | ICD-10-CM | POA: Diagnosis not present

## 2023-01-04 DIAGNOSIS — R143 Flatulence: Secondary | ICD-10-CM | POA: Diagnosis not present

## 2023-01-18 ENCOUNTER — Other Ambulatory Visit: Payer: Self-pay | Admitting: Hematology and Oncology

## 2023-03-18 ENCOUNTER — Telehealth: Payer: Self-pay | Admitting: Neurology

## 2023-03-18 ENCOUNTER — Ambulatory Visit: Payer: Medicare PPO | Admitting: Neurology

## 2023-03-18 ENCOUNTER — Encounter: Payer: Self-pay | Admitting: Neurology

## 2023-03-18 ENCOUNTER — Ambulatory Visit: Payer: Medicare PPO

## 2023-03-18 NOTE — Telephone Encounter (Signed)
This is FYI for POD 3 and Dr Vickey Huger.  At 12:17 pm pt did leave a vm to say she felt too sick to drive and that she would call back to r/s.  The vm was not heard by phone staff until 2:03pm

## 2023-04-01 ENCOUNTER — Ambulatory Visit: Payer: Medicare PPO | Attending: General Surgery | Admitting: Rehabilitation

## 2023-04-01 DIAGNOSIS — C50211 Malignant neoplasm of upper-inner quadrant of right female breast: Secondary | ICD-10-CM | POA: Insufficient documentation

## 2023-04-01 DIAGNOSIS — Z170 Estrogen receptor positive status [ER+]: Secondary | ICD-10-CM | POA: Insufficient documentation

## 2023-04-01 DIAGNOSIS — Z483 Aftercare following surgery for neoplasm: Secondary | ICD-10-CM | POA: Insufficient documentation

## 2023-04-01 NOTE — Therapy (Signed)
OUTPATIENT PHYSICAL THERAPY SOZO SCREENING NOTE   Patient Name: Deborah Dougherty MRN: [id number] DOB:Jun 18, 1946, 77 y.o., female Today's Date: 04/01/2023  PCP: Geoffry Paradise, MD REFERRING PROVIDER: Griselda Miner, MD   PT End of Session - 04/01/23 1648     Visit Number 2   screen   PT Start Time 1648    PT Stop Time 1651    PT Time Calculation (min) 3 min    Activity Tolerance Patient tolerated treatment well    Behavior During Therapy [facility] for tasks assessed/performed             Past Medical History:  Diagnosis Date   Anxiety    Arthritis    hands, wrists, hips and low back   Cancer ([facility]) 09/2021   right breast IDC   Depression    History of radiation therapy    Right breast 11/21/21-12/19/21- Dr. Antony Blackbird   Past Surgical History:  Procedure Laterality Date   ABDOMINAL HYSTERECTOMY  1998   BREAST LUMPECTOMY WITH RADIOACTIVE SEED AND SENTINEL LYMPH NODE BIOPSY Right 10/20/2021   Procedure: RIGHT BREAST LUMPECTOMY WITH RADIOACTIVE SEED AND SENTINEL LYMPH NODE BIOPSY;  Surgeon: Griselda Miner, MD;  Location: [facility];  Service: General;  Laterality: Right;   TONSILLECTOMY  1955   TOTAL HIP ARTHROPLASTY Left 12/09/2020   Procedure: TOTAL HIP ARTHROPLASTY ANTERIOR APPROACH;  Surgeon: Samson Frederic, MD;  [facility];  Service: Orthopedics;  Laterality: Left;   WRIST SURGERY  2010   Patient Active Problem List   Diagnosis Date Noted   Diplopia 10/11/2022   Genetic testing 09/29/2021   Family history of breast cancer 09/21/2021   Malignant neoplasm of upper-inner quadrant of right breast in female, estrogen receptor positive ([facility]) 09/18/2021   Hip fracture ([facility]) 12/08/2020    REFERRING DIAG: right breast cancer at risk for lymphedema  THERAPY DIAG: Aftercare following surgery for neoplasm  Malignant neoplasm of upper-inner quadrant of right breast in female, estrogen receptor positive ([facility])  PERTINENT HISTORY: Patient was diagnosed on  08/10/2021 with right grade II invasive ductal carcinoma breast cancer. It is ER/PR positive and HER2 negative with a Ki67 of 10%. Patient underwent a right lumpectomy and sentinel node biopsy on 10/20/2021 with 3 negative lymph nodes removed. She has hardware in her left wrist from a surgery after a fall and had her left hip replaced on 12/09/2020.  PRECAUTIONS: right UE Lymphedema risk, None  SUBJECTIVE: Pt returns for her 3 month L-Dex screen.   PAIN:  Are you having pain? No  SOZO SCREENING: Patient was assessed today using the SOZO machine to determine the lymphedema index score. This was compared to her baseline score. It was determined that she is within the recommended range when compared to her baseline and no further action is needed at this time. She will continue SOZO screenings. These are done every 3 months for 2 years post operatively followed by every 6 months for 2 years, and then annually.   L-DEX FLOWSHEETS - 04/01/23 1600       L-DEX LYMPHEDEMA SCREENING   Measurement Type Unilateral    L-DEX MEASUREMENT EXTREMITY Upper Extremity    POSITION  Standing    DOMINANT SIDE Right    At Risk Side Right    BASELINE SCORE (UNILATERAL) -2.2    L-DEX SCORE (UNILATERAL) -11.3    VALUE CHANGE (UNILAT) -9.1              Riggin Cuttino, Julieanne Manson, PT 04/01/2023,  4:50 PM

## 2023-07-08 ENCOUNTER — Ambulatory Visit: Payer: Medicare PPO | Attending: General Surgery

## 2023-07-08 DIAGNOSIS — R0600 Dyspnea, unspecified: Secondary | ICD-10-CM | POA: Diagnosis not present

## 2023-07-08 DIAGNOSIS — R0789 Other chest pain: Secondary | ICD-10-CM | POA: Diagnosis not present

## 2023-07-08 DIAGNOSIS — E785 Hyperlipidemia, unspecified: Secondary | ICD-10-CM | POA: Diagnosis not present

## 2023-07-08 DIAGNOSIS — R002 Palpitations: Secondary | ICD-10-CM | POA: Diagnosis not present

## 2023-07-08 DIAGNOSIS — Z483 Aftercare following surgery for neoplasm: Secondary | ICD-10-CM | POA: Insufficient documentation

## 2023-07-08 DIAGNOSIS — H93A9 Pulsatile tinnitus, unspecified ear: Secondary | ICD-10-CM | POA: Diagnosis not present

## 2023-07-10 ENCOUNTER — Other Ambulatory Visit: Payer: Self-pay | Admitting: Nurse Practitioner

## 2023-07-10 ENCOUNTER — Ambulatory Visit: Payer: Medicare PPO

## 2023-07-10 ENCOUNTER — Encounter: Payer: Self-pay | Admitting: Nurse Practitioner

## 2023-07-10 DIAGNOSIS — Z483 Aftercare following surgery for neoplasm: Secondary | ICD-10-CM

## 2023-07-10 DIAGNOSIS — H93A9 Pulsatile tinnitus, unspecified ear: Secondary | ICD-10-CM

## 2023-07-10 NOTE — Therapy (Signed)
OUTPATIENT PHYSICAL THERAPY SOZO SCREENING NOTE   Patient Name: Deborah Dougherty MRN: [id number] DOB:Jun 15, 1946, 77 y.o., female Today's Date: 07/10/2023  PCP: Geoffry Paradise, MD REFERRING PROVIDER: Griselda Miner, MD   PT End of Session - 07/10/23 1527     Visit Number 2   due to screen only   PT Start Time 1515    PT Stop Time 1525    PT Time Calculation (min) 10 min    Activity Tolerance Patient tolerated treatment well    Behavior During Therapy [facility] for tasks assessed/performed             Past Medical History:  Diagnosis Date   Anxiety    Arthritis    hands, wrists, hips and low back   Cancer ([facility]) 09/2021   right breast IDC   Depression    History of radiation therapy    Right breast 11/21/21-12/19/21- Dr. Antony Blackbird   Past Surgical History:  Procedure Laterality Date   ABDOMINAL HYSTERECTOMY  1998   BREAST LUMPECTOMY WITH RADIOACTIVE SEED AND SENTINEL LYMPH NODE BIOPSY Right 10/20/2021   Procedure: RIGHT BREAST LUMPECTOMY WITH RADIOACTIVE SEED AND SENTINEL LYMPH NODE BIOPSY;  Surgeon: Griselda Miner, MD;  Location: [facility];  Service: General;  Laterality: Right;   TONSILLECTOMY  1955   TOTAL HIP ARTHROPLASTY Left 12/09/2020   Procedure: TOTAL HIP ARTHROPLASTY ANTERIOR APPROACH;  Surgeon: Samson Frederic, MD;  [facility];  Service: Orthopedics;  Laterality: Left;   WRIST SURGERY  2010   Patient Active Problem List   Diagnosis Date Noted   Diplopia 10/11/2022   Genetic testing 09/29/2021   Family history of breast cancer 09/21/2021   Malignant neoplasm of upper-inner quadrant of right breast in female, estrogen receptor positive ([facility]) 09/18/2021   Hip fracture ([facility]) 12/08/2020    REFERRING DIAG: right breast cancer at risk for lymphedema  THERAPY DIAG:  Aftercare following surgery for neoplasm  PERTINENT HISTORY:    Patient was diagnosed on 08/10/2021 with right grade II invasive ductal carcinoma breast cancer. It is ER/PR  positive and HER2 negative with a Ki67 of 10%. Patient underwent a right lumpectomy and sentinel node biopsy on 10/20/2021 with 3 negative lymph nodes removed. She has hardware in her left wrist from a surgery after a fall and had her left hip replaced on 12/09/2020.  PRECAUTIONS: right UE Lymphedema risk,   SUBJECTIVE: Tender under armpit but it doesn't bother me.  PAIN:  Are you having pain? No  SOZO SCREENING: Patient was assessed today using the SOZO machine to determine the lymphedema index score. This was compared to her baseline score. It was determined that she is within the recommended range when compared to her baseline and no further action is needed at this time. She will continue SOZO screenings. These are done every 3 months for 2 years post operatively followed by every 6 months for 2 years, and then annually.   SCORE -2.3    Waynette Buttery, PT 07/10/2023, 3:28 PM

## 2023-07-25 ENCOUNTER — Ambulatory Visit
Admission: RE | Admit: 2023-07-25 | Discharge: 2023-07-25 | Disposition: A | Discharge status: Home / Self Care | Payer: Medicare PPO | Source: Ambulatory Visit | Attending: Nurse Practitioner | Admitting: Nurse Practitioner

## 2023-07-25 DIAGNOSIS — R413 Other amnesia: Secondary | ICD-10-CM | POA: Diagnosis not present

## 2023-07-25 DIAGNOSIS — H93A9 Pulsatile tinnitus, unspecified ear: Secondary | ICD-10-CM

## 2023-07-25 MED ORDER — IOPAMIDOL (ISOVUE-370) INJECTION 76%
100.0000 mL | Freq: Once | INTRAVENOUS | Status: AC | PRN
  Administered 2023-07-25: 75 mL via INTRAVENOUS

## 2023-08-21 ENCOUNTER — Telehealth: Payer: Self-pay | Admitting: Adult Health

## 2023-08-23 DIAGNOSIS — H2513 Age-related nuclear cataract, bilateral: Secondary | ICD-10-CM | POA: Diagnosis not present

## 2023-08-23 DIAGNOSIS — H52203 Unspecified astigmatism, bilateral: Secondary | ICD-10-CM | POA: Diagnosis not present

## 2023-09-06 ENCOUNTER — Ambulatory Visit: Payer: Medicare PPO | Attending: Cardiology | Admitting: Cardiology

## 2023-09-06 ENCOUNTER — Encounter: Payer: Self-pay | Admitting: Cardiology

## 2023-09-06 VITALS — BP 137/78 | HR 65 | Resp 16 | Ht 66.0 in | Wt 162.0 lb

## 2023-09-06 DIAGNOSIS — R002 Palpitations: Secondary | ICD-10-CM

## 2023-09-06 DIAGNOSIS — E7800 Pure hypercholesterolemia, unspecified: Secondary | ICD-10-CM | POA: Diagnosis not present

## 2023-09-06 DIAGNOSIS — R0609 Other forms of dyspnea: Secondary | ICD-10-CM

## 2023-09-06 NOTE — Progress Notes (Unsigned)
Cardiology Office Note:  .   Date:  09/07/2023  ID:  Deborah Dougherty, DOB [birth year], MRN [id number] PCP: Geoffry Paradise, MD  [facility] Providers Cardiologist:  Yates Decamp, MD   History of Present Illness: .   Deborah Dougherty is a 77 y.o. Female patient with hypercholesterolemia, GERD, osteoporosis referred to me for evaluation of palpitations and dyspnea on exertion that is relatively new onset.  She was evaluated by her PCP on 06/30/2023 and she also complained of 1 episode of chest pain that has not recurred.  She has not had coronary calcium score on 11/30/2022 revealing a total score of 53.4 in the 48th percentile for age and sex matched individuals.  There was also mild reticular and groundglass opacity and traction bronchiectasis suggestive of ILD.    Discussed the use of AI scribe software for clinical note transcription with the patient, who gave verbal consent to proceed.  History of Present Illness   Deborah Dougherty, a former Engineer, site with a history of sinusitis and bone spurs in her neck, presents with palpitations, shortness of breath, and a previous episode of chest pain with no recurrence.  The symptoms of dyspnea and palpitation started over a year ago, following an eye infection attributed to sinusitis.   The palpitations can occur at any time, not just when she is at rest. The patient also reports shortness of breath, which has worsened over the past year, particularly since she stopped walking her dog regularly a couple of months ago following its death. She also mentions a tendency for liquids to go down her windpipe when swallowing, leading to a persistent cough and a feeling of congestion in her throat.   She has noticed increased drainage from one side of her head in the mornings. The patient has a history of smoking in college and was recently diagnosed with high cholesterol, for which she started medication earlier this year.     Review of Systems   Cardiovascular:  Positive for dyspnea on exertion and palpitations. Negative for chest pain, leg swelling, orthopnea and paroxysmal nocturnal dyspnea.  Respiratory:  Positive for cough.     Labs   No results found for: "CHOL", "HDL", "LDLCALC", "LDLDIRECT", "TRIG", "CHOLHDL" Lab Results  Component Value Date   NA 140 09/20/2021   K 3.9 09/20/2021   CO2 28 09/20/2021   GLUCOSE 99 09/20/2021   BUN 13 09/20/2021   CREATININE 0.90 09/20/2021   CALCIUM 9.1 09/20/2021   GFRNONAA >60 09/20/2021      Latest Ref Rng & Units 09/20/2021   12:35 PM 01/02/2021    4:00 PM 12/14/2020    4:43 AM  BMP  Glucose 70 - 99 mg/dL 99  295  284   BUN 8 - 23 mg/dL 13  13  19    Creatinine 0.44 - 1.00 mg/dL 1.32  4.40  1.02   Sodium 135 - 145 mmol/L 140  146  139   Potassium 3.5 - 5.1 mmol/L 3.9  3.9  4.2   Chloride 98 - 111 mmol/L 105  113  106   CO2 22 - 32 mmol/L 28  24  25    Calcium 8.9 - 10.3 mg/dL 9.1  8.6  8.5       Latest Ref Rng & Units 08/01/2022    3:50 PM 09/20/2021   12:35 PM 01/02/2021    4:00 PM  CBC  WBC 4.0 - 10.5 K/uL 7.5  7.3  9.4   Hemoglobin 12.0 - 15.0  g/dL 16.1  09.6  04.5   Hematocrit 36.0 - 46.0 % 35.1  37.6  38.4   Platelets 150 - 400 K/uL 245  234  280    External Labs:  Labs 10/29/2022:  Serum glucose 89, BUN 14, creatinine 0.7, EGFR 81 mL, potassium 4.1.  LFTs normal.  Hb 12.5/HCT 34.7, platelets 214.  Total cholesterol 211, triglycerides 122, HDL 46, LDL 141.  TSH 1.35, normal.  Vitamin D 36.8.  Physical Exam:   VS:  BP 137/78 (BP Location: Left Arm, Patient Position: Sitting, Cuff Size: Normal)   Pulse 65   Resp 16   Ht 5\' 6"  (1.676 m)   Wt 162 lb (73.5 kg)   SpO2 96%   BMI 26.15 kg/m    Wt Readings from Last 3 Encounters:  09/06/23 162 lb (73.5 kg)  12/03/22 147 lb 4 oz (66.8 kg)  11/13/22 143 lb 9.6 oz (65.1 kg)     Physical Exam Neck:     Vascular: No carotid bruit or JVD.  Cardiovascular:     Rate and Rhythm: Normal rate and regular rhythm.      Pulses: Intact distal pulses.     Heart sounds: Normal heart sounds. No murmur heard.    No gallop.  Pulmonary:     Effort: Pulmonary effort is normal.     Breath sounds: Normal breath sounds.  Abdominal:     General: Bowel sounds are normal.     Palpations: Abdomen is soft.  Musculoskeletal:     Right lower leg: No edema.     Left lower leg: No edema.     Studies Reviewed: Marland Kitchen    Coronary calcium score 11/30/2022 1. Total calcium score of 53.4 is at percentile 48 for subjects of the same age, gender, and race/ethnicity. 2. Mild reticular and ground-glass opacities with traction bronchiolectasis, findings can be seen in setting of interstitial lung abnormality. Correlate with patient's systems and consider pulmonary function tests and/or ILD protocol CT. Visualized ascending and descending aorta normal in size.  Aortic atherosclerosis..  EKG:    EKG Interpretation Date/Time:  Friday September 06 2023 13:59:19 EST Ventricular Rate:  64 PR Interval:  172 QRS Duration:  92 QT Interval:  406 QTC Calculation: 418 R Axis:   34  Text Interpretation: EKG 09/06/2023: Normal sinus rhythm at rate of 64 bpm, normal axis.  Incomplete right bundle branch block.  No evidence of ischemia.  Normal EKG. Compared to 01/02/2021, inferolateral ST depression and T wave inversion no longer present. Confirmed by Delrae Rend [phone]) on 09/06/2023 2:01:19 PM    Medications and allergies    Allergies  Allergen Reactions   Cefdinir Other (See Comments)    causes  diarrhea  sometimes severe     Current Outpatient Medications:    acetaminophen (TYLENOL) 325 MG tablet, Take 2 tablets (650 mg total) by mouth every 6 (six) hours as needed for mild pain or headache., Disp: , Rfl:    bisacodyl (DULCOLAX) 5 MG EC tablet, Take 5 mg by mouth as needed for moderate constipation., Disp: , Rfl:    cholecalciferol (VITAMIN D3) 25 MCG (1000 UNIT) tablet, Take 1 tablet (1,000 Units total) by mouth daily.,  Disp: , Rfl:    gabapentin (NEURONTIN) 100 MG capsule, Take 1 capsule (100 mg total) by mouth 2 (two) times daily., Disp: , Rfl:    ibuprofen (ADVIL) 200 MG tablet, Take 200 mg by mouth every 6 (six) hours as needed., Disp: , Rfl:  letrozole (FEMARA) 2.5 MG tablet, TAKE ONE TABLET BY MOUTH DAILY, Disp: 90 tablet, Rfl: 3   naproxen sodium (ALEVE) 220 MG tablet, Take 220 mg by mouth as needed., Disp: , Rfl:    PARoxetine (PAXIL) 20 MG tablet, Take 20 mg by mouth daily., Disp: , Rfl:    rosuvastatin (CRESTOR) 20 MG tablet, Take 20 mg by mouth at bedtime., Disp: , Rfl:    vitamin B-12 (CYANOCOBALAMIN) 1000 MCG tablet, Take 1 tablet (1,000 mcg total) by mouth daily. (Patient taking differently: Take 1,000 mcg by mouth once a week.), Disp: , Rfl:    ASSESSMENT AND PLAN: .      ICD-10-CM   1. Palpitations  R00.2 EKG 12-Lead    Cardiac Stress Test: Informed Consent Details: Physician/Practitioner Attestation; Transcribe to consent form and obtain patient signature    ECHOCARDIOGRAM COMPLETE    EXERCISE TOLERANCE TEST (ETT)    2. Dyspnea on exertion  R06.09 Cardiac Stress Test: Informed Consent Details: Physician/Practitioner Attestation; Transcribe to consent form and obtain patient signature    ECHOCARDIOGRAM COMPLETE    EXERCISE TOLERANCE TEST (ETT)    3. Hypercholesteremia  E78.00 Lipid panel    Lipid panel      Assessment and Plan    Palpitations Likely benign PVCs and PACs, occurring mostly at rest and lasting a few seconds. Possible triggers include stress, alcohol, lack of sleep, and potential lung disease. No evidence of atrial fibrillation or other life-threatening arrhythmias. -Order treadmill exercise stress test and echocardiogram to further evaluate. -Consider a low-dose beta-blocker or calcium channel blocker to slow heart rate if palpitations become a major issue.  Shortness of Breath Likely related to potential interstitial lung disease, possibly due to chronic aspiration  and/or acid reflux. Symptoms have worsened over the past couple of months, possibly due to decreased physical activity.  She also states she has occasional episodes of coughing especially after drinking fluids.  No dysphagia.  She may need GI evaluation/swallow evaluation as well. -Refer to pulmonologist for further evaluation and management. -Treadmill exercise stress test has also been set up along with an echocardiogram.  Hyperlipidemia On cholesterol-lowering medication since early 2024. No recent lipid panel. -Order fasting lipid panel at [facility] to assess current cholesterol levels.  General Health Maintenance -Encourage regular physical activity and healthy diet. -Consider grief counseling or support groups to help cope with recent loss of pet.   I will personally perform the test and if I find abnormalities,  will perform further evaluation. Otherwise unless new on ongoing symptoms(patient advised to contact us), preventive  therapy is recommended. I will then see the patient on a PRN basis.  Patient states that she is very comfortable with MyChart messaging or telephone call if all the tests are negative from cardiac standpoint.  Signed,  Yates Decamp, MD, [facility] 09/07/2023, 6:03 AM [facility] [address] [place], [place] [address] Phone: [phone]. Fax:  [phone]

## 2023-09-06 NOTE — Patient Instructions (Signed)
Lab Work: Lipid profile If you have labs (blood work) drawn today and your tests are completely normal, you will receive your results only by: MyChart Message (if you have MyChart) OR A paper copy in the mail If you have any lab test that is abnormal or we need to change your treatment, we will call you to review the results.  Testing/Procedures: ECHO Your physician has requested that you have an echocardiogram. Echocardiography is a painless test that uses sound waves to create images of your heart. It provides your doctor with information about the size and shape of your heart and how well your heart's chambers and valves are working. This procedure takes approximately one hour. There are no restrictions for this procedure. Please do NOT wear cologne, perfume, aftershave, or lotions (deodorant is allowed). Please arrive 15 minutes prior to your appointment time.  Please note: We ask at that you not bring children with you during ultrasound (echo/ vascular) testing. Due to room size and safety concerns, children are not allowed in the ultrasound rooms during exams. Our front office staff cannot provide observation of children in our lobby area while testing is being conducted. An adult accompanying a patient to their appointment will only be allowed in the ultrasound room at the discretion of the ultrasound technician under special circumstances. We apologize for any inconvenience.  Exercise Stress Test GXT/POET Your physician has requested that you have an exercise tolerance test. For further information please visit [email or web address] Please also follow instruction sheet, as given.  Follow-Up: At [facility], you and your health needs are our priority.  As part of our continuing mission to provide you with exceptional heart care, we have created designated Provider Care Teams.  These Care Teams include your primary Cardiologist (physician) and Advanced Practice Providers (APPs -   Physician Assistants and Nurse Practitioners) who all work together to provide you with the care you need, when you need it.  Your next appointment:   As Needed  Provider:   Yates Decamp, MD

## 2023-09-10 DIAGNOSIS — R0981 Nasal congestion: Secondary | ICD-10-CM | POA: Diagnosis not present

## 2023-09-10 DIAGNOSIS — H6123 Impacted cerumen, bilateral: Secondary | ICD-10-CM | POA: Diagnosis not present

## 2023-09-25 ENCOUNTER — Other Ambulatory Visit: Payer: Self-pay | Admitting: Cardiology

## 2023-09-25 DIAGNOSIS — E7800 Pure hypercholesterolemia, unspecified: Secondary | ICD-10-CM | POA: Diagnosis not present

## 2023-09-26 LAB — LIPID PANEL
Chol/HDL Ratio: 2.9 {ratio} (ref 0.0–4.4)
Cholesterol, Total: 138 mg/dL (ref 100–199)
HDL: 48 mg/dL (ref >39)
LDL Chol Calc (NIH): 68 mg/dL (ref 0–99)
Triglycerides: 126 mg/dL (ref 0–149)
VLDL Cholesterol Cal: 22 mg/dL (ref 5–40)

## 2023-10-01 ENCOUNTER — Encounter: Payer: Self-pay | Admitting: Cardiology

## 2023-10-01 ENCOUNTER — Ambulatory Visit (HOSPITAL_COMMUNITY): Payer: Medicare PPO | Attending: Cardiology

## 2023-10-01 DIAGNOSIS — R0609 Other forms of dyspnea: Secondary | ICD-10-CM | POA: Diagnosis not present

## 2023-10-01 DIAGNOSIS — R002 Palpitations: Secondary | ICD-10-CM | POA: Diagnosis not present

## 2023-10-01 LAB — ECHOCARDIOGRAM COMPLETE
Area-P 1/2: 4.86 cm2
S' Lateral: 2.2 cm

## 2023-10-07 ENCOUNTER — Ambulatory Visit: Payer: Medicare PPO | Attending: General Surgery

## 2023-10-07 VITALS — Wt 158.2 lb

## 2023-10-07 DIAGNOSIS — Z483 Aftercare following surgery for neoplasm: Secondary | ICD-10-CM

## 2023-10-07 NOTE — Therapy (Signed)
OUTPATIENT PHYSICAL THERAPY SOZO SCREENING NOTE   Patient Name: Deborah Dougherty MRN: [id number] DOB:[birth year], 78 y.o., female Today's Date: 10/07/2023  PCP: Geoffry Paradise, MD REFERRING PROVIDER: Griselda Miner, MD   PT End of Session - 10/07/23 1618     Visit Number 2   # unchanged due to screen only   PT Start Time 1615    PT Stop Time 1620    PT Time Calculation (min) 5 min    Activity Tolerance Patient tolerated treatment well    Behavior During Therapy [facility] for tasks assessed/performed             Past Medical History:  Diagnosis Date   Anxiety    Arthritis    hands, wrists, hips and low back   Cancer (HCC) 09/2021   right breast IDC   Chest pain    Depression    Diplopia    GERD (gastroesophageal reflux disease)    History of radiation therapy    Right breast 11/21/21-12/19/21- Dr. Antony Blackbird   HLD (hyperlipidemia)    Irregular heart beat    OA (osteoarthritis)    Palpitations    SOB (shortness of breath)    Past Surgical History:  Procedure Laterality Date   ABDOMINAL HYSTERECTOMY  1998   BREAST LUMPECTOMY WITH RADIOACTIVE SEED AND SENTINEL LYMPH NODE BIOPSY Right 10/20/2021   Procedure: RIGHT BREAST LUMPECTOMY WITH RADIOACTIVE SEED AND SENTINEL LYMPH NODE BIOPSY;  Surgeon: Griselda Miner, MD;  Location: [facility];  Service: General;  Laterality: Right;   TONSILLECTOMY  1955   TOTAL HIP ARTHROPLASTY Left 12/09/2020   Procedure: TOTAL HIP ARTHROPLASTY ANTERIOR APPROACH;  Surgeon: Samson Frederic, MD;  Location: [facility];  Service: Orthopedics;  Laterality: Left;   WRIST SURGERY  2010   Patient Active Problem List   Diagnosis Date Noted   Diplopia 10/11/2022   Genetic testing 09/29/2021   Family history of breast cancer 09/21/2021   Malignant neoplasm of upper-inner quadrant of right breast in female, estrogen receptor positive (HCC) 09/18/2021   Hip fracture (HCC) 12/08/2020    REFERRING DIAG: right breast cancer at risk for  lymphedema  THERAPY DIAG:  Aftercare following surgery for neoplasm  PERTINENT HISTORY:    Patient was diagnosed on 08/10/2021 with right grade II invasive ductal carcinoma breast cancer. It is ER/PR positive and HER2 negative with a Ki67 of 10%. Patient underwent a right lumpectomy and sentinel node biopsy on 10/20/2021 with 3 negative lymph nodes removed. She has hardware in her left wrist from a surgery after a fall and had her left hip replaced on 12/09/2020.  PRECAUTIONS: right UE Lymphedema risk,   SUBJECTIVE: Pt returns for her 3 month L-Dex screen. "Still tender under my armpit and I feel a knot. I have the mammogram in Feb so I'll address with them then."  PAIN:  Are you having pain? No  SOZO SCREENING: Patient was assessed today using the SOZO machine to determine the lymphedema index score. This was compared to her baseline score. It was determined that she is within the recommended range when compared to her baseline and no further action is needed at this time. She will continue SOZO screenings. These are done every 3 months for 2 years post operatively followed by every 6 months for 2 years, and then annually.   L-DEX FLOWSHEETS - 10/07/23 1600       L-DEX LYMPHEDEMA SCREENING   Measurement Type Unilateral    L-DEX MEASUREMENT EXTREMITY Upper Extremity  POSITION  Standing    DOMINANT SIDE Right    At Risk Side Right    BASELINE SCORE (UNILATERAL) -2.2    L-DEX SCORE (UNILATERAL) -6.8    VALUE CHANGE (UNILAT) -4.6                Hermenia Bers, PTA 10/07/2023, 4:21 PM

## 2023-10-08 ENCOUNTER — Ambulatory Visit: Payer: Medicare PPO | Attending: Cardiovascular Disease

## 2023-10-08 ENCOUNTER — Encounter: Payer: Self-pay | Admitting: Cardiology

## 2023-10-08 DIAGNOSIS — R002 Palpitations: Secondary | ICD-10-CM | POA: Diagnosis not present

## 2023-10-08 DIAGNOSIS — R0609 Other forms of dyspnea: Secondary | ICD-10-CM | POA: Diagnosis not present

## 2023-10-08 LAB — EXERCISE TOLERANCE TEST
Angina Index: 0
Duke Treadmill Score: 6
Estimated workload: 7
Exercise duration (min): 6 min
Exercise duration (sec): 0 s
MPHR: 143 {beats}/min
Peak HR: 127 {beats}/min
Percent HR: 88 %
RPE: 16
Rest HR: 71 {beats}/min
ST Depression (mm): 0 mm

## 2023-10-08 NOTE — Progress Notes (Signed)
Treadmill exercise stress test 09/30/2023: Exercise duration 6 minutes, 7.0 METS workload.  Normal blood pressure response.  Negative for ST-T abnormalities for ischemia. Normal stress test.

## 2023-10-09 NOTE — Telephone Encounter (Signed)
I spoke with patient and reviewed results with her.

## 2023-10-24 DIAGNOSIS — N6331 Unspecified lump in axillary tail of the right breast: Secondary | ICD-10-CM | POA: Diagnosis not present

## 2023-10-24 DIAGNOSIS — Z853 Personal history of malignant neoplasm of breast: Secondary | ICD-10-CM | POA: Diagnosis not present

## 2023-10-24 DIAGNOSIS — R92323 Mammographic fibroglandular density, bilateral breasts: Secondary | ICD-10-CM | POA: Diagnosis not present

## 2023-11-04 DIAGNOSIS — E559 Vitamin D deficiency, unspecified: Secondary | ICD-10-CM | POA: Diagnosis not present

## 2023-11-04 DIAGNOSIS — Z1212 Encounter for screening for malignant neoplasm of rectum: Secondary | ICD-10-CM | POA: Diagnosis not present

## 2023-11-04 DIAGNOSIS — Z0189 Encounter for other specified special examinations: Secondary | ICD-10-CM | POA: Diagnosis not present

## 2023-11-04 DIAGNOSIS — F419 Anxiety disorder, unspecified: Secondary | ICD-10-CM | POA: Diagnosis not present

## 2023-11-04 DIAGNOSIS — E663 Overweight: Secondary | ICD-10-CM | POA: Diagnosis not present

## 2023-11-14 ENCOUNTER — Ambulatory Visit: Payer: Medicare PPO | Admitting: Adult Health

## 2023-11-14 DIAGNOSIS — H2512 Age-related nuclear cataract, left eye: Secondary | ICD-10-CM | POA: Diagnosis not present

## 2023-11-14 DIAGNOSIS — H25812 Combined forms of age-related cataract, left eye: Secondary | ICD-10-CM | POA: Diagnosis not present

## 2023-11-14 DIAGNOSIS — Z961 Presence of intraocular lens: Secondary | ICD-10-CM | POA: Diagnosis not present

## 2023-11-15 DIAGNOSIS — E785 Hyperlipidemia, unspecified: Secondary | ICD-10-CM | POA: Diagnosis not present

## 2023-11-17 DIAGNOSIS — Z0189 Encounter for other specified special examinations: Secondary | ICD-10-CM | POA: Diagnosis not present

## 2023-11-17 DIAGNOSIS — E559 Vitamin D deficiency, unspecified: Secondary | ICD-10-CM | POA: Diagnosis not present

## 2023-11-17 DIAGNOSIS — Z1212 Encounter for screening for malignant neoplasm of rectum: Secondary | ICD-10-CM | POA: Diagnosis not present

## 2023-11-18 DIAGNOSIS — K219 Gastro-esophageal reflux disease without esophagitis: Secondary | ICD-10-CM | POA: Diagnosis not present

## 2023-11-18 DIAGNOSIS — E663 Overweight: Secondary | ICD-10-CM | POA: Diagnosis not present

## 2023-11-18 DIAGNOSIS — M8580 Other specified disorders of bone density and structure, unspecified site: Secondary | ICD-10-CM | POA: Diagnosis not present

## 2023-11-18 DIAGNOSIS — M1990 Unspecified osteoarthritis, unspecified site: Secondary | ICD-10-CM | POA: Diagnosis not present

## 2023-11-18 DIAGNOSIS — F329 Major depressive disorder, single episode, unspecified: Secondary | ICD-10-CM | POA: Diagnosis not present

## 2023-11-18 DIAGNOSIS — E559 Vitamin D deficiency, unspecified: Secondary | ICD-10-CM | POA: Diagnosis not present

## 2023-11-18 DIAGNOSIS — Z0000 Encounter for general adult medical examination without abnormal findings: Secondary | ICD-10-CM | POA: Diagnosis not present

## 2023-11-18 DIAGNOSIS — Z1331 Encounter for screening for depression: Secondary | ICD-10-CM | POA: Diagnosis not present

## 2023-11-18 DIAGNOSIS — Z1339 Encounter for screening examination for other mental health and behavioral disorders: Secondary | ICD-10-CM | POA: Diagnosis not present

## 2023-11-18 DIAGNOSIS — H9191 Unspecified hearing loss, right ear: Secondary | ICD-10-CM | POA: Diagnosis not present

## 2023-11-18 DIAGNOSIS — E785 Hyperlipidemia, unspecified: Secondary | ICD-10-CM | POA: Diagnosis not present

## 2023-11-19 DIAGNOSIS — R82998 Other abnormal findings in urine: Secondary | ICD-10-CM | POA: Diagnosis not present

## 2023-11-21 ENCOUNTER — Ambulatory Visit: Payer: Medicare PPO | Admitting: Adult Health

## 2023-11-25 ENCOUNTER — Encounter: Payer: Self-pay | Admitting: Adult Health

## 2023-11-25 ENCOUNTER — Telehealth: Payer: Self-pay | Admitting: Adult Health

## 2023-11-25 ENCOUNTER — Inpatient Hospital Stay: Payer: Medicare PPO | Attending: Adult Health | Admitting: Adult Health

## 2023-11-25 VITALS — BP 140/77 | HR 85 | Temp 98.0°F | Resp 18 | Ht 66.0 in | Wt 159.8 lb

## 2023-11-25 DIAGNOSIS — Z79811 Long term (current) use of aromatase inhibitors: Secondary | ICD-10-CM | POA: Diagnosis not present

## 2023-11-25 DIAGNOSIS — Z1732 Human epidermal growth factor receptor 2 negative status: Secondary | ICD-10-CM | POA: Insufficient documentation

## 2023-11-25 DIAGNOSIS — Z801 Family history of malignant neoplasm of trachea, bronchus and lung: Secondary | ICD-10-CM | POA: Insufficient documentation

## 2023-11-25 DIAGNOSIS — Z800 Family history of malignant neoplasm of digestive organs: Secondary | ICD-10-CM | POA: Insufficient documentation

## 2023-11-25 DIAGNOSIS — Z803 Family history of malignant neoplasm of breast: Secondary | ICD-10-CM | POA: Diagnosis not present

## 2023-11-25 DIAGNOSIS — C50211 Malignant neoplasm of upper-inner quadrant of right female breast: Secondary | ICD-10-CM | POA: Diagnosis not present

## 2023-11-25 DIAGNOSIS — Z87891 Personal history of nicotine dependence: Secondary | ICD-10-CM | POA: Diagnosis not present

## 2023-11-25 DIAGNOSIS — Z1721 Progesterone receptor positive status: Secondary | ICD-10-CM | POA: Insufficient documentation

## 2023-11-25 DIAGNOSIS — Z90710 Acquired absence of both cervix and uterus: Secondary | ICD-10-CM | POA: Diagnosis not present

## 2023-11-25 DIAGNOSIS — Z170 Estrogen receptor positive status [ER+]: Secondary | ICD-10-CM | POA: Insufficient documentation

## 2023-11-25 DIAGNOSIS — Z923 Personal history of irradiation: Secondary | ICD-10-CM | POA: Insufficient documentation

## 2023-11-25 NOTE — Progress Notes (Unsigned)
 [facility] Cancer Follow up:    Deborah Paradise, MD [facility] [place] [address]   DIAGNOSIS: Cancer Staging  Malignant neoplasm of upper-inner quadrant of right breast in female, estrogen receptor positive (HCC) Staging form: Breast, AJCC 8th Edition - Clinical stage from 09/20/2021: Stage IA (cT1c, cN0, cM0, G2, ER+, PR+, HER2-) - Signed by Serena Croissant, MD on 09/20/2021 Stage prefix: Initial diagnosis Histologic grading system: 3 grade system   SUMMARY OF ONCOLOGIC HISTORY: Oncology History  Malignant neoplasm of upper-inner quadrant of right breast in female, estrogen receptor positive (HCC)  09/14/2021 Initial Diagnosis   Screening mammogram detected right upper medial quadrant mass 1.4 cm spiculated mass by mammogram.  By ultrasound it measured 1 cm axilla negative, biopsy revealed grade 1-2 IDC ER 100%, PR 80%, HER2 negative, Ki-67 10%   09/20/2021 Cancer Staging   Staging form: Breast, AJCC 8th Edition - Clinical stage from 09/20/2021: Stage IA (cT1c, cN0, cM0, G2, ER+, PR+, HER2-) - Signed by Serena Croissant, MD on 09/20/2021 Stage prefix: Initial diagnosis Histologic grading system: 3 grade system    Genetic Testing   [facility] CancerNext-Expanded is Negative. Report date is 10/02/2021.  The CancerNext-Expanded gene panel offered by [facility] and includes sequencing, rearrangement, and RNA analysis for the following 77 genes: AIP, ALK, APC, ATM, AXIN2, BAP1, BARD1, BLM, BMPR1A, BRCA1, BRCA2, BRIP1, CDC73, CDH1, CDK4, CDKN1B, CDKN2A, CHEK2, CTNNA1, DICER1, FANCC, FH, FLCN, GALNT12, KIF1B, LZTR1, MAX, MEN1, MET, MLH1, MSH2, MSH3, MSH6, MUTYH, NBN, NF1, NF2, NTHL1, PALB2, PHOX2B, PMS2, POT1, PRKAR1A, PTCH1, PTEN, RAD51C, RAD51D, RB1, RECQL, RET, SDHA, SDHAF2, SDHB, SDHC, SDHD, SMAD4, SMARCA4, SMARCB1, SMARCE1, STK11, SUFU, TMEM127, TP53, TSC1, TSC2, VHL and XRCC2 (sequencing and deletion/duplication); EGFR, EGLN1, HOXB13, KIT, MITF, PDGFRA, POLD1, and POLE  (sequencing only); EPCAM and GREM1 (deletion/duplication only).    10/20/2021 Surgery   Right lumpectomy: 1.7 cm grade 3 IDC with high-grade DCIS, margins negative, 0/3 lymph nodes negative, ER 100%, PR 80%, HER2 negative (0), Ki-67 10%   11/22/2021 - 12/19/2021 Radiation Therapy   Site Technique Total Dose (Gy) Dose per Fx (Gy) Completed Fx Beam Energies  Breast, Right: Breast_R 3D 40.05/40.05 2.67 15/15 6X  Breast, Right: Breast_R_Bst specialPort 10/10 2 5/5 9E     01/09/2022 -  Anti-estrogen oral therapy   With leftAdjuvant antiestrogen therapy result with letrozole     CURRENT THERAPY: Letrozole  INTERVAL HISTORY:  Discussed the use of AI scribe software for clinical note transcription with the patient, who gave verbal consent to proceed.  Deborah Dougherty 78 y.o. female returns for    Patient Active Problem List   Diagnosis Date Noted   Diplopia 10/11/2022   Genetic testing 09/29/2021   Family history of breast cancer 09/21/2021   Malignant neoplasm of upper-inner quadrant of right breast in female, estrogen receptor positive (HCC) 09/18/2021   Hip fracture (HCC) 12/08/2020    is allergic to cefdinir.  MEDICAL HISTORY: Past Medical History:  Diagnosis Date   Anxiety    Arthritis    hands, wrists, hips and low back   Cancer (HCC) 09/2021   right breast IDC   Chest pain    Depression    Diplopia    GERD (gastroesophageal reflux disease)    History of radiation therapy    Right breast 11/21/21-12/19/21- Dr. Antony Blackbird   HLD (hyperlipidemia)    Irregular heart beat    OA (osteoarthritis)    Palpitations    SOB (shortness of breath)  SURGICAL HISTORY: Past Surgical History:  Procedure Laterality Date   ABDOMINAL HYSTERECTOMY  1998   BREAST LUMPECTOMY WITH RADIOACTIVE SEED AND SENTINEL LYMPH NODE BIOPSY Right 10/20/2021   Procedure: RIGHT BREAST LUMPECTOMY WITH RADIOACTIVE SEED AND SENTINEL LYMPH NODE BIOPSY;  Surgeon: Griselda Miner, MD;  Location: [facility];  Service: General;  Laterality: Right;   TONSILLECTOMY  1955   TOTAL HIP ARTHROPLASTY Left 12/09/2020   Procedure: TOTAL HIP ARTHROPLASTY ANTERIOR APPROACH;  Surgeon: Samson Frederic, MD;  Location: [facility];  Service: Orthopedics;  Laterality: Left;   WRIST SURGERY  2010    SOCIAL HISTORY: Social History   Socioeconomic History   Marital status: Widowed    Spouse name: Not on file   Number of children: 1   Years of education: Not on file   Highest education level: Bachelor's degree (e.g., BA, AB, BS)  Occupational History   Not on file  Tobacco Use   Smoking status: Former    Current packs/day: 0.00    Types: Cigarettes    Quit date: 35    Years since quitting: 56.2   Smokeless tobacco: Never   Tobacco comments:    quit 1969  Substance and Sexual Activity   Alcohol use: Yes    Comment: occasionally, 6 oz daily   Drug use: Never   Sexual activity: Not Currently    Birth control/protection: Surgical  Other Topics Concern   Not on file  Social History Narrative   Lives alone   Caffeine 10 oz daily   Social Drivers of Corporate investment banker Strain: Not on file  Food Insecurity: Not on file  Transportation Needs: Not on file  Physical Activity: Not on file  Stress: Not on file  Social Connections: Not on file  Intimate Partner Violence: Not on file    FAMILY HISTORY: Family History  Problem Relation Age of Onset   Breast cancer Mother 67   Lung cancer Mother        breast cancer metastasized to lung   Stomach cancer Father 31   Mesothelioma Brother     Review of Systems  Constitutional:  Negative for appetite change, chills, fatigue, fever and unexpected weight change.  HENT:   Negative for hearing loss, lump/mass and trouble swallowing.   Eyes:  Negative for eye problems and icterus.  Respiratory:  Negative for chest tightness, cough and shortness of breath.   Cardiovascular:  Negative for chest pain, leg swelling and palpitations.   Gastrointestinal:  Negative for abdominal distention, abdominal pain, constipation, diarrhea, nausea and vomiting.  Endocrine: Negative for hot flashes.  Genitourinary:  Negative for difficulty urinating.   Musculoskeletal:  Negative for arthralgias.  Skin:  Negative for itching and rash.  Neurological:  Negative for dizziness, extremity weakness, headaches and numbness.  Hematological:  Negative for adenopathy. Does not bruise/bleed easily.  Psychiatric/Behavioral:  Negative for depression. The patient is not nervous/anxious.       PHYSICAL EXAMINATION    Vitals:   11/25/23 1118  BP: (!) 140/77  Pulse: 85  Resp: 18  Temp: 98 F (36.7 C)  SpO2: 97%    Physical Exam Constitutional:      General: She is not in acute distress.    Appearance: Normal appearance. She is not toxic-appearing.  HENT:     Head: Normocephalic and atraumatic.     Mouth/Throat:     Mouth: Mucous membranes are moist.     Pharynx: Oropharynx is clear. No oropharyngeal exudate  or posterior oropharyngeal erythema.  Eyes:     General: No scleral icterus. Cardiovascular:     Rate and Rhythm: Normal rate and regular rhythm.     Pulses: Normal pulses.     Heart sounds: Normal heart sounds.  Pulmonary:     Effort: Pulmonary effort is normal.     Breath sounds: Normal breath sounds.  Chest:     Comments: Right breast s/p lumpectomy and radiation, no sign of local recurrence, left breast benign Abdominal:     General: Abdomen is flat. Bowel sounds are normal. There is no distension.     Palpations: Abdomen is soft.     Tenderness: There is no abdominal tenderness.  Musculoskeletal:        General: No swelling.     Cervical back: Neck supple.  Lymphadenopathy:     Cervical: No cervical adenopathy.     Upper Body:     Right upper body: No supraclavicular or axillary adenopathy.     Left upper body: No supraclavicular or axillary adenopathy.  Skin:    General: Skin is warm and dry.     Findings: No  rash.  Neurological:     General: No focal deficit present.     Mental Status: She is alert.  Psychiatric:        Mood and Affect: Mood normal.        Behavior: Behavior normal.       ASSESSMENT and THERAPY PLAN:   No problem-specific Assessment & Plan notes found for this encounter.   All questions were answered. The patient knows to call the clinic with any problems, questions or concerns. We can certainly see the patient much sooner if necessary.  Total encounter time:*** minutes*in face-to-face visit time, chart review, lab review, care coordination, order entry, and documentation of the encounter time.  Lillard Anes, NP 11/25/23 11:34 AM Medical Oncology and Hematology [facility] [address] [place], [place] [address] Tel. [phone]    Fax. [phone]  *Total Encounter Time as defined by the Centers for Medicare and Medicaid Services includes, in addition to the face-to-face time of a patient visit (documented in the note above) non-face-to-face time: obtaining and reviewing outside history, ordering and reviewing medications, tests or procedures, care coordination (communications with other health care professionals or caregivers) and documentation in the medical record.

## 2023-11-25 NOTE — Telephone Encounter (Signed)
 Scheduled appointment per 3/17 los. Talked with the patient and she is aware of the made appointments.

## 2023-11-26 ENCOUNTER — Encounter: Payer: Self-pay | Admitting: Adult Health

## 2023-11-26 NOTE — Assessment & Plan Note (Signed)
 Deborah Dougherty is a 78 year old woman with history of stage Ia ER/PR positive breast cancer diagnosed in January 2023.  She is status postlumpectomy, adjuvant radiation, and antiestrogen therapy with letrozole.  Breast Cancer Recent mammogram showed no malignancy. Continuing letrozole to reduce recurrence risk. - Continue letrozole therapy. - Schedule follow-up appointment for next year. - Advise her to contact the clinic if any issues arise before the next scheduled visit.  Bone Health Letrozole may reduce bone density. Last bone density test in July 2022. Discussed need for updated testing. - Advise her to contact [facility] to schedule an updated bone density test. - Recommend calcium and vitamin D supplementation and weight-bearing exercises.  Lifestyle and Exercise Struggles with exercise motivation. Discussed strategies for improvement. - Encourage finding a walking buddy for accountability. - Discuss the benefits of reading '[employer]' for building better exercise habits.  RTC in 1 year for continued surveillance.

## 2023-12-19 DIAGNOSIS — Z961 Presence of intraocular lens: Secondary | ICD-10-CM | POA: Diagnosis not present

## 2023-12-19 DIAGNOSIS — H2511 Age-related nuclear cataract, right eye: Secondary | ICD-10-CM | POA: Diagnosis not present

## 2024-01-06 ENCOUNTER — Ambulatory Visit: Payer: Medicare PPO

## 2024-01-13 ENCOUNTER — Ambulatory Visit: Attending: General Surgery

## 2024-01-13 VITALS — Wt 156.5 lb

## 2024-01-13 DIAGNOSIS — Z483 Aftercare following surgery for neoplasm: Secondary | ICD-10-CM | POA: Insufficient documentation

## 2024-01-13 NOTE — Therapy (Signed)
 OUTPATIENT PHYSICAL THERAPY SOZO SCREENING NOTE   Patient Name: Deborah Dougherty MRN: [id number] DOB:06-28-1946, 78 y.o., female Today's Date: 01/13/2024  PCP: Suan Elm, MD REFERRING PROVIDER: Caralyn Chandler, MD   PT End of Session - 01/13/24 [phone]     Visit Number 2   # unchanged due to screen only   PT Start Time 0929    PT Stop Time 0933    PT Time Calculation (min) 4 min    Activity Tolerance Patient tolerated treatment well    Behavior During Therapy [facility] for tasks assessed/performed             Past Medical History:  Diagnosis Date   Anxiety    Arthritis    hands, wrists, hips and low back   Cancer (HCC) 09/2021   right breast IDC   Chest pain    Depression    Diplopia    GERD (gastroesophageal reflux disease)    History of radiation therapy    Right breast 11/21/21-12/19/21- Dr. Retta Caster   HLD (hyperlipidemia)    Irregular heart beat    OA (osteoarthritis)    Palpitations    SOB (shortness of breath)    Past Surgical History:  Procedure Laterality Date   ABDOMINAL HYSTERECTOMY  1998   BREAST LUMPECTOMY WITH RADIOACTIVE SEED AND SENTINEL LYMPH NODE BIOPSY Right 10/20/2021   Procedure: RIGHT BREAST LUMPECTOMY WITH RADIOACTIVE SEED AND SENTINEL LYMPH NODE BIOPSY;  Surgeon: Caralyn Chandler, MD;  Location: [facility];  Service: General;  Laterality: Right;   TONSILLECTOMY  1955   TOTAL HIP ARTHROPLASTY Left 12/09/2020   Procedure: TOTAL HIP ARTHROPLASTY ANTERIOR APPROACH;  Surgeon: Adonica Hoose, MD;  Location: [facility];  Service: Orthopedics;  Laterality: Left;   WRIST SURGERY  2010   Patient Active Problem List   Diagnosis Date Noted   Diplopia 10/11/2022   Genetic testing 09/29/2021   Family history of breast cancer 09/21/2021   Malignant neoplasm of upper-inner quadrant of right breast in female, estrogen receptor positive (HCC) 09/18/2021   Hip fracture (HCC) 12/08/2020    REFERRING DIAG: right breast cancer at risk for  lymphedema  THERAPY DIAG:  Aftercare following surgery for neoplasm  PERTINENT HISTORY: Patient was diagnosed on 08/10/2021 with right grade II invasive ductal carcinoma breast cancer. It is ER/PR positive and HER2 negative with a Ki67 of 10%. Patient underwent a right lumpectomy and sentinel node biopsy on 10/20/2021 with 3 negative lymph nodes removed. She has hardware in her left wrist from a surgery after a fall and had her left hip replaced on 12/09/2020.  PRECAUTIONS: right UE Lymphedema risk,   SUBJECTIVE: Pt returns for her last 3 month L-Dex screen.   PAIN:  Are you having pain? No  SOZO SCREENING: Patient was assessed today using the SOZO machine to determine the lymphedema index score. This was compared to her baseline score. It was determined that she is within the recommended range when compared to her baseline and no further action is needed at this time. She will continue SOZO screenings. These are done every 3 months for 2 years post operatively followed by every 6 months for 2 years, and then annually.   L-DEX FLOWSHEETS - 01/13/24 0900       L-DEX LYMPHEDEMA SCREENING   Measurement Type Unilateral    L-DEX MEASUREMENT EXTREMITY Upper Extremity    POSITION  Standing    DOMINANT SIDE Right    At Risk Side Right    BASELINE  SCORE (UNILATERAL) -2.2    L-DEX SCORE (UNILATERAL) -4.1    VALUE CHANGE (UNILAT) -1.9            P: Begin 6 month screens.     Denyce Flank, PTA 01/13/2024, 9:33 AM

## 2024-02-27 ENCOUNTER — Other Ambulatory Visit: Payer: Self-pay | Admitting: Hematology and Oncology

## 2024-07-20 ENCOUNTER — Ambulatory Visit: Attending: General Surgery

## 2024-07-20 DIAGNOSIS — Z483 Aftercare following surgery for neoplasm: Secondary | ICD-10-CM | POA: Insufficient documentation

## 2024-11-24 ENCOUNTER — Ambulatory Visit: Admitting: Adult Health

## 2024-11-24 ENCOUNTER — Inpatient Hospital Stay
